# Patient Record
Sex: Female | Born: 1950 | Race: White | Hispanic: No | Marital: Single | Smoking: Former smoker
Health system: Southern US, Community
[De-identification: ages and names within clinical notes are randomized; demographics above are authoritative.]

## PROBLEM LIST (undated history)

## (undated) DIAGNOSIS — T7840XA Allergy, unspecified, initial encounter: Secondary | ICD-10-CM

## (undated) DIAGNOSIS — IMO0002 Reserved for concepts with insufficient information to code with codable children: Secondary | ICD-10-CM

## (undated) DIAGNOSIS — M1990 Unspecified osteoarthritis, unspecified site: Secondary | ICD-10-CM

## (undated) DIAGNOSIS — B019 Varicella without complication: Secondary | ICD-10-CM

## (undated) DIAGNOSIS — F32A Depression, unspecified: Secondary | ICD-10-CM

## (undated) DIAGNOSIS — E049 Nontoxic goiter, unspecified: Secondary | ICD-10-CM

## (undated) DIAGNOSIS — K5790 Diverticulosis of intestine, part unspecified, without perforation or abscess without bleeding: Secondary | ICD-10-CM

## (undated) DIAGNOSIS — Z87898 Personal history of other specified conditions: Secondary | ICD-10-CM

## (undated) DIAGNOSIS — R569 Unspecified convulsions: Secondary | ICD-10-CM

## (undated) DIAGNOSIS — K219 Gastro-esophageal reflux disease without esophagitis: Secondary | ICD-10-CM

## (undated) DIAGNOSIS — K635 Polyp of colon: Secondary | ICD-10-CM

## (undated) DIAGNOSIS — Z5189 Encounter for other specified aftercare: Secondary | ICD-10-CM

## (undated) DIAGNOSIS — F419 Anxiety disorder, unspecified: Secondary | ICD-10-CM

## (undated) DIAGNOSIS — H269 Unspecified cataract: Secondary | ICD-10-CM

## (undated) DIAGNOSIS — K5792 Diverticulitis of intestine, part unspecified, without perforation or abscess without bleeding: Secondary | ICD-10-CM

## (undated) HISTORY — DX: Personal history of other specified conditions: Z87.898

## (undated) HISTORY — DX: Reserved for concepts with insufficient information to code with codable children: IMO0002

## (undated) HISTORY — DX: Polyp of colon: K63.5

## (undated) HISTORY — DX: Gastro-esophageal reflux disease without esophagitis: K21.9

## (undated) HISTORY — DX: Allergy, unspecified, initial encounter: T78.40XA

## (undated) HISTORY — DX: Nontoxic goiter, unspecified: E04.9

## (undated) HISTORY — DX: Anxiety disorder, unspecified: F41.9

## (undated) HISTORY — DX: Depression, unspecified: F32.A

## (undated) HISTORY — DX: Unspecified cataract: H26.9

## (undated) HISTORY — PX: BREAST EXCISIONAL BIOPSY: SUR124

## (undated) HISTORY — DX: Diverticulitis of intestine, part unspecified, without perforation or abscess without bleeding: K57.92

## (undated) HISTORY — PX: MENISECTOMY: SHX5181

## (undated) HISTORY — DX: Varicella without complication: B01.9

## (undated) HISTORY — PX: LOBECTOMY FOR SEIZURE FOCUS: SHX333

## (undated) HISTORY — PX: TRIGGER FINGER RELEASE: SHX641

## (undated) HISTORY — DX: Encounter for other specified aftercare: Z51.89

## (undated) HISTORY — PX: EYE SURGERY: SHX253

## (undated) HISTORY — PX: WISDOM TOOTH EXTRACTION: SHX21

## (undated) HISTORY — PX: COSMETIC SURGERY: SHX468

## (undated) HISTORY — PX: JOINT REPLACEMENT: SHX530

## (undated) HISTORY — PX: BRAIN SURGERY: SHX531

---
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
## 1996-10-08 HISTORY — PX: LOBECTOMY FOR SEIZURE FOCUS: SHX333

## 2007-10-09 HISTORY — PX: BREAST LUMPECTOMY: SHX2

## 2013-12-05 ENCOUNTER — Emergency Department (HOSPITAL_COMMUNITY)
Admission: EM | Admit: 2013-12-05 | Discharge: 2013-12-05 | Disposition: A | Discharge status: Home / Self Care | Payer: 59 | Source: Home / Self Care | Attending: Emergency Medicine | Admitting: Emergency Medicine

## 2013-12-05 ENCOUNTER — Encounter (HOSPITAL_COMMUNITY): Payer: Self-pay | Admitting: Emergency Medicine

## 2013-12-05 DIAGNOSIS — IMO0002 Reserved for concepts with insufficient information to code with codable children: Secondary | ICD-10-CM

## 2013-12-05 DIAGNOSIS — H04559 Acquired stenosis of unspecified nasolacrimal duct: Secondary | ICD-10-CM

## 2013-12-05 HISTORY — DX: Unspecified convulsions: R56.9

## 2013-12-05 MED ORDER — CEPHALEXIN 500 MG PO CAPS: 500.0000 mg | ORAL_CAPSULE | Freq: Three times a day (TID) | ORAL | Status: DC

## 2013-12-05 MED ORDER — POLYMYXIN B-TRIMETHOPRIM 10000-0.1 UNIT/ML-% OP SOLN: 1.0000 [drp] | OPHTHALMIC | Status: DC

## 2013-12-05 NOTE — ED Provider Notes (Signed)
CSN: [id number]     Arrival date & time 12/05/13  1623 History   First MD Initiated Contact with Patient 12/05/13 1706     Chief Complaint  Patient presents with  . Eye Problem   (Consider location/radiation/quality/duration/timing/severity/associated sxs/prior Treatment) HPI 64 year old woman here with watery right eye.  She reports that 1 week ago, her hairdresser splashed some water on her face and some got in the right eye.  The next day, she noticed increased tears on the right side and this has persisted.  Also with some some right sided nasal congestion.  She denies any pain, but states the eye is irritated although she states she has been rubbing at it quite a bit.  She has tried lubricant eye drops and anti-histamine eye drops.  The anti-histamine drops seem to help some, but as soon she stops using them, the tearing is back.  Denies any changes in her vision.    Past Medical History  Diagnosis Date  . Seizures     resolved   Past Surgical History  Procedure Laterality Date  . Lobectomy for seizure focus  1998  . Adenoidectomy  1963  . Tonsillectomy  1963  . Splenectomy  1961  . Breast lumpectomy Left 2009    benign  . Wisdom tooth extraction     Family History  Problem Relation Age of Onset  . Pneumonia Mother   . Dementia Father    History  Substance Use Topics  . Smoking status: Former Smoker    Quit date: 10/08/2002  . Smokeless tobacco: Not on file  . Alcohol Use: Not on file   OB History   Grav Para Term Preterm Abortions TAB SAB Ect Mult Living                 Review of Systems  HENT: Positive for congestion (right sided).   Eyes: Positive for discharge (clear tears on right) and redness (right). Negative for photophobia, pain, itching and visual disturbance.    Allergies  Scopolamine and Penicillins  Home Medications   Current Outpatient Rx  Name  Route  Sig  Dispense  Refill  . calcium carbonate (OS-CAL) 600 MG TABS tablet   Oral   Take 600  mg by mouth daily with breakfast.         . Cholecalciferol (VITAMIN D3) 3000 UNITS TABS   Oral   Take by mouth.         . Flaxseed, Linseed, (GNP FLAX SEED OIL) 1000 MG CAPS   Oral   Take by mouth.         . levETIRAcetam (KEPPRA) 500 MG tablet   Oral   Take 500 mg by mouth 2 (two) times daily.         . Melatonin 3 MG TABS   Oral   Take by mouth.         . Multiple Vitamins-Minerals (MULTIVITAMIN WITH MINERALS) tablet   Oral   Take 1 tablet by mouth daily.         . Omega-3 Fatty Acids (FISH OIL) 1000 MG CAPS   Oral   Take by mouth.         . cephALEXin (KEFLEX) 500 MG capsule   Oral   Take 1 capsule (500 mg total) by mouth 3 (three) times daily.   30 capsule   0   . trimethoprim-polymyxin b (POLYTRIM) ophthalmic solution   Right Eye   Place 1 drop into the right eye  every 4 (four) hours.   10 mL   0    BP 129/84  Pulse 87  Temp(Src) 98.4 F (36.9 C) (Oral)  Resp 17  SpO2 97% Physical Exam  Constitutional: She is oriented to person, place, and time. She appears well-developed and well-nourished. No distress.  HENT:  Head: Normocephalic and atraumatic.  Eyes: EOM and lids are normal. Pupils are equal, round, and reactive to light. Lids are everted and swept, no foreign bodies found. Right eye exhibits no discharge. No foreign body present in the right eye. Left eye exhibits no discharge. Right conjunctiva is injected. Right conjunctiva has no hemorrhage. Left conjunctiva is not injected. Left conjunctiva has no hemorrhage.  Fluoroscein exam negative for corneal abrasion or foreign body. Right tear duct appears mildly swollen compared to the left.  Neck: Normal range of motion.  Cardiovascular: Normal rate.   Pulmonary/Chest: Effort normal.  Neurological: She is alert and oriented to person, place, and time.  Skin: Skin is warm and dry. No rash noted. She is not diaphoretic.  Psychiatric: Judgment and thought content normal.    ED Course   Procedures (including critical care time) Labs Review Labs Reviewed - No data to display Imaging Review No results found.   MDM   1. Blocked tear duct    No corneal abrasion or foreign body found.  Discussed warm compresses and massage with patient. Prescriptions for polytrim eye drops and keflex given to prevent infection. If not better in 1 week, instructed to call Dr. Talbert Forest (on call ophtho) for an appointment.    Melony Overly, MD 12/05/13 [phone]

## 2013-12-05 NOTE — Discharge Instructions (Signed)
I think your tear duct is blocked. We did an exam looking for a scratch on your eye and did not see anything.  Please use the antibiotic eye drops for the next 10 days. Take the oral antibiotic as well.   This to prevent the duct from getting infected.  Apply warm compresses 3 times a day. Massage the inner corner of the eye to try and help open up the duct.  If it is not better in the next week, please call the eye doctor for an appointment.

## 2013-12-05 NOTE — ED Notes (Signed)
Hairdresser splashed water in her R eye on Fri.  It started tearing that night and won't stop.  Her  R nostril gets congested and a decongestant helps it.

## 2013-12-05 NOTE — ED Provider Notes (Signed)
Medical screening examination/treatment/procedure(s) were performed by a resident physician and as supervising physician I was immediately available for consultation/collaboration.  Philipp Deputy, M.D.  Harden Mo, MD 12/05/13 2014

## 2015-03-19 ENCOUNTER — Emergency Department (HOSPITAL_COMMUNITY)
Admission: EM | Admit: 2015-03-19 | Discharge: 2015-03-19 | Disposition: A | Discharge status: Home / Self Care | Payer: 59 | Source: Home / Self Care | Attending: Family Medicine | Admitting: Family Medicine

## 2015-03-19 ENCOUNTER — Encounter (HOSPITAL_COMMUNITY): Payer: Self-pay | Admitting: Emergency Medicine

## 2015-03-19 DIAGNOSIS — R1032 Left lower quadrant pain: Secondary | ICD-10-CM | POA: Diagnosis not present

## 2015-03-19 DIAGNOSIS — K5730 Diverticulosis of large intestine without perforation or abscess without bleeding: Secondary | ICD-10-CM

## 2015-03-19 LAB — POCT URINALYSIS DIP (DEVICE)
BILIRUBIN URINE: NEGATIVE
Glucose, UA: NEGATIVE mg/dL
Ketones, ur: NEGATIVE mg/dL
LEUKOCYTES UA: NEGATIVE
Nitrite: NEGATIVE
Protein, ur: NEGATIVE mg/dL
Specific Gravity, Urine: 1.02 (ref 1.005–1.030)
Urobilinogen, UA: 0.2 mg/dL (ref 0.0–1.0)
pH: 8.5 — ABNORMAL HIGH (ref 5.0–8.0)

## 2015-03-19 MED ORDER — METRONIDAZOLE 500 MG PO TABS: 500.0000 mg | ORAL_TABLET | Freq: Three times a day (TID) | ORAL | Status: DC

## 2015-03-19 MED ORDER — SULFAMETHOXAZOLE-TRIMETHOPRIM 800-160 MG PO TABS: 2.0000 | ORAL_TABLET | Freq: Two times a day (BID) | ORAL | Status: DC

## 2015-03-19 NOTE — ED Provider Notes (Signed)
Desiree Snow is a 64 y.o. female who presents to Urgent Care today for abdominal cramping and bloating. Symptoms present for about 2 weeks. Patient developed a fever today of 100.46F at home. Symptoms started after she ate some peanuts. She notes mild fatigue. She has a diagnosis of diverticulosis but has never had diverticulitis. Abdominal pain is moderate in the left quadrant. 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   Past Medical History  Diagnosis Date  . Seizures     resolved   Past Surgical History  Procedure Laterality Date  . Adenoidectomy  1963  . Tonsillectomy  1963  . Splenectomy  1961  . Breast lumpectomy Left 2009    benign  . Wisdom tooth extraction    . Lobectomy for seizure focus     History  Substance Use Topics  . Smoking status: Former Smoker    Quit date: 10/08/2002  . Smokeless tobacco: Not on file  . Alcohol Use: Not on file   ROS as above Medications: No current facility-administered medications for this encounter.   Current Outpatient Prescriptions  Medication Sig Dispense Refill  . calcium carbonate (OS-CAL) 600 MG TABS tablet Take 600 mg by mouth daily with breakfast.    . cephALEXin (KEFLEX) 500 MG capsule Take 1 capsule (500 mg total) by mouth 3 (three) times daily. 30 capsule 0  . Cholecalciferol (VITAMIN D3) 3000 UNITS TABS Take by mouth.    . Flaxseed, Linseed, (GNP FLAX SEED OIL) 1000 MG CAPS Take by mouth.    . levETIRAcetam (KEPPRA) 500 MG tablet Take 500 mg by mouth 2 (two) times daily.    . Melatonin 3 MG TABS Take by mouth.    . metroNIDAZOLE (FLAGYL) 500 MG tablet Take 1 tablet (500 mg total) by mouth 3 (three) times daily. 21 tablet 0  . Multiple Vitamins-Minerals (MULTIVITAMIN WITH MINERALS) tablet Take 1 tablet by mouth daily.    . Omega-3 Fatty Acids (FISH OIL) 1000 MG CAPS Take by mouth.    . sulfamethoxazole-trimethoprim (BACTRIM DS,SEPTRA DS) 800-160 MG per tablet Take 2 tablets by mouth  2 (two) times daily. 28 tablet 0  . trimethoprim-polymyxin b (POLYTRIM) ophthalmic solution Place 1 drop into the right eye every 4 (four) hours. 10 mL 0   Allergies  Allergen Reactions  . Quinolones   . Scopolamine Hives     on her fingers no itchng  . Penicillins Rash     Exam:  BP 120/70 mmHg  Pulse 94  Temp(Src) 99.5 F (37.5 C) (Oral)  Resp 16  SpO2 99% Gen: Well NAD HEENT: EOMI,  MMM Lungs: Normal work of breathing. CTABL Heart: RRR no MRG Abd: NABS, Soft. Nondistended, mildly tender to palpation left abdomen. No rebound or guarding. Exts: Brisk capillary refill, warm and well perfused.   Results for orders placed or performed during the hospital encounter of 03/19/15 (from the past 24 hour(s))  POCT urinalysis dip (device)     Status: Abnormal   Collection Time: 03/19/15  3:24 PM  Result Value Ref Range   Glucose, UA NEGATIVE NEGATIVE mg/dL   Bilirubin Urine NEGATIVE NEGATIVE   Ketones, ur NEGATIVE NEGATIVE mg/dL   Specific Gravity, Urine 1.020 1.005 - 1.030   Hgb urine dipstick TRACE (A) NEGATIVE   pH 8.5 (H) 5.0 - 8.0   Protein, ur NEGATIVE NEGATIVE mg/dL   Urobilinogen, UA 0.2 0.0 - 1.0 mg/dL   Nitrite NEGATIVE NEGATIVE   Leukocytes, UA NEGATIVE NEGATIVE   No  results found.  Assessment and Plan: 64 y.o. female with possibly diverticulitis. Treat with Bactrim and Flagyl empirically. Follow-up with PCP.  Discussed warning signs or symptoms. Please see discharge instructions. Patient expresses understanding.     Gregor Hams, MD 03/19/15 [phone]

## 2015-03-19 NOTE — Discharge Instructions (Signed)
Thank you for coming in today.  Eat a clear diet for a while If your belly pain worsens, or you have high fever, bad vomiting, blood in your stool or black tarry stool go to the Emergency Room.   Diverticulitis Diverticulitis is inflammation or infection of small pouches in your colon that form when you have a condition called diverticulosis. The pouches in your colon are called diverticula. Your colon, or large intestine, is where water is absorbed and stool is formed. Complications of diverticulitis can include:  Bleeding.  Severe infection.  Severe pain.  Perforation of your colon.  Obstruction of your colon. CAUSES  Diverticulitis is caused by bacteria. Diverticulitis happens when stool becomes trapped in diverticula. This allows bacteria to grow in the diverticula, which can lead to inflammation and infection. RISK FACTORS People with diverticulosis are at risk for diverticulitis. Eating a diet that does not include enough fiber from fruits and vegetables may make diverticulitis more likely to develop. SYMPTOMS  Symptoms of diverticulitis may include:  Abdominal pain and tenderness. The pain is normally located on the left side of the abdomen, but may occur in other areas.  Fever and chills.  Bloating.  Cramping.  Nausea.  Vomiting.  Constipation.  Diarrhea.  Blood in your stool. DIAGNOSIS  Your health care provider will ask you about your medical history and do a physical exam. You may need to have tests done because many medical conditions can cause the same symptoms as diverticulitis. Tests may include:  Blood tests.  Urine tests.  Imaging tests of the abdomen, including X-rays and CT scans. When your condition is under control, your health care provider may recommend that you have a colonoscopy. A colonoscopy can show how severe your diverticula are and whether something else is causing your symptoms. TREATMENT  Most cases of diverticulitis are mild and  can be treated at home. Treatment may include:  Taking over-the-counter pain medicines.  Following a clear liquid diet.  Taking antibiotic medicines by mouth for 7-10 days. More severe cases may be treated at a hospital. Treatment may include:  Not eating or drinking.  Taking prescription pain medicine.  Receiving antibiotic medicines through an IV tube.  Receiving fluids and nutrition through an IV tube.  Surgery. HOME CARE INSTRUCTIONS   Follow your health care provider's instructions carefully.  Follow a full liquid diet or other diet as directed by your health care provider. After your symptoms improve, your health care provider may tell you to change your diet. He or she may recommend you eat a high-fiber diet. Fruits and vegetables are good sources of fiber. Fiber makes it easier to pass stool.  Take fiber supplements or probiotics as directed by your health care provider.  Only take medicines as directed by your health care provider.  Keep all your follow-up appointments. SEEK MEDICAL CARE IF:   Your pain does not improve.  You have a hard time eating food.  Your bowel movements do not return to normal. SEEK IMMEDIATE MEDICAL CARE IF:   Your pain becomes worse.  Your symptoms do not get better.  Your symptoms suddenly get worse.  You have a fever.  You have repeated vomiting.  You have bloody or black, tarry stools. MAKE SURE YOU:   Understand these instructions.  Will watch your condition.  Will get help right away if you are not doing well or get worse. Document Released: 07/04/2005 Document Revised: 09/29/2013 Document Reviewed: 08/19/2013 [facility] Patient Information 2015 [place], [place]. This information  is not intended to replace advice given to you by your health care provider. Make sure you discuss any questions you have with your health care provider.

## 2015-03-19 NOTE — ED Notes (Signed)
C/o lower cramping abd pain Did have diarrhea once pepto bismol was used as tx Did have fever Stated 2009 was dx with diverculitis

## 2015-03-20 LAB — URINE CULTURE: Colony Count: 30000

## 2015-09-26 ENCOUNTER — Other Ambulatory Visit: Payer: Self-pay

## 2015-09-26 DIAGNOSIS — Z1231 Encounter for screening mammogram for malignant neoplasm of breast: Secondary | ICD-10-CM

## 2015-10-19 ENCOUNTER — Ambulatory Visit
Admission: RE | Admit: 2015-10-19 | Discharge: 2015-10-19 | Disposition: A | Discharge status: Home / Self Care | Payer: PPO | Source: Ambulatory Visit

## 2015-10-19 DIAGNOSIS — Z1231 Encounter for screening mammogram for malignant neoplasm of breast: Secondary | ICD-10-CM | POA: Diagnosis not present

## 2015-11-24 ENCOUNTER — Other Ambulatory Visit (HOSPITAL_COMMUNITY)
Admission: RE | Admit: 2015-11-24 | Discharge: 2015-11-24 | Disposition: A | Discharge status: Home / Self Care | Payer: PPO | Source: Ambulatory Visit | Attending: Family Medicine | Admitting: Family Medicine

## 2015-11-24 ENCOUNTER — Other Ambulatory Visit: Payer: Self-pay | Admitting: Family Medicine

## 2015-11-24 DIAGNOSIS — M25561 Pain in right knee: Secondary | ICD-10-CM | POA: Diagnosis not present

## 2015-11-24 DIAGNOSIS — Z0000 Encounter for general adult medical examination without abnormal findings: Secondary | ICD-10-CM | POA: Diagnosis not present

## 2015-11-24 DIAGNOSIS — Z86010 Personal history of colonic polyps: Secondary | ICD-10-CM | POA: Diagnosis not present

## 2015-11-24 DIAGNOSIS — G40909 Epilepsy, unspecified, not intractable, without status epilepticus: Secondary | ICD-10-CM | POA: Diagnosis not present

## 2015-11-24 DIAGNOSIS — R5383 Other fatigue: Secondary | ICD-10-CM | POA: Diagnosis not present

## 2015-11-24 DIAGNOSIS — R7301 Impaired fasting glucose: Secondary | ICD-10-CM | POA: Diagnosis not present

## 2015-11-24 DIAGNOSIS — Z1211 Encounter for screening for malignant neoplasm of colon: Secondary | ICD-10-CM | POA: Diagnosis not present

## 2015-11-24 DIAGNOSIS — Z124 Encounter for screening for malignant neoplasm of cervix: Secondary | ICD-10-CM | POA: Diagnosis not present

## 2015-11-24 DIAGNOSIS — Z23 Encounter for immunization: Secondary | ICD-10-CM | POA: Diagnosis not present

## 2015-11-24 DIAGNOSIS — Z1389 Encounter for screening for other disorder: Secondary | ICD-10-CM | POA: Diagnosis not present

## 2015-11-24 DIAGNOSIS — Z136 Encounter for screening for cardiovascular disorders: Secondary | ICD-10-CM | POA: Diagnosis not present

## 2015-11-24 DIAGNOSIS — F419 Anxiety disorder, unspecified: Secondary | ICD-10-CM | POA: Diagnosis not present

## 2015-11-24 LAB — LIPID PANEL
Cholesterol: 176 (ref 0–200)
HDL: 48 (ref 35–70)
LDL Cholesterol: 107
Triglycerides: 101 (ref 40–160)

## 2015-11-24 LAB — BASIC METABOLIC PANEL
BUN: 13 (ref 4–21)
CREATININE: 1 (ref 0.5–1.1)
GLUCOSE: 103
POTASSIUM: 5.1 (ref 3.4–5.3)
Sodium: 139 (ref 137–147)

## 2015-11-24 LAB — HEPATIC FUNCTION PANEL
ALT: 17 (ref 7–35)
AST: 15 (ref 13–35)

## 2015-11-24 LAB — TSH: TSH: 1.29 (ref 0.41–5.90)

## 2015-11-24 LAB — HEMOGLOBIN A1C: HEMOGLOBIN A1C: 6.2

## 2015-11-25 LAB — CYTOLOGY - PAP

## 2015-11-28 DIAGNOSIS — Z1211 Encounter for screening for malignant neoplasm of colon: Secondary | ICD-10-CM | POA: Diagnosis not present

## 2015-11-28 DIAGNOSIS — E878 Other disorders of electrolyte and fluid balance, not elsewhere classified: Secondary | ICD-10-CM | POA: Diagnosis not present

## 2015-11-30 LAB — FECAL OCCULT BLOOD, GUAIAC: Fecal Occult Blood: NEGATIVE

## 2015-12-02 DIAGNOSIS — M1711 Unilateral primary osteoarthritis, right knee: Secondary | ICD-10-CM | POA: Diagnosis not present

## 2015-12-02 DIAGNOSIS — M25561 Pain in right knee: Secondary | ICD-10-CM | POA: Diagnosis not present

## 2015-12-02 DIAGNOSIS — M1712 Unilateral primary osteoarthritis, left knee: Secondary | ICD-10-CM | POA: Diagnosis not present

## 2015-12-02 DIAGNOSIS — M25562 Pain in left knee: Secondary | ICD-10-CM | POA: Diagnosis not present

## 2015-12-08 DIAGNOSIS — M25561 Pain in right knee: Secondary | ICD-10-CM | POA: Diagnosis not present

## 2015-12-08 DIAGNOSIS — M25562 Pain in left knee: Secondary | ICD-10-CM | POA: Diagnosis not present

## 2015-12-08 DIAGNOSIS — M170 Bilateral primary osteoarthritis of knee: Secondary | ICD-10-CM | POA: Diagnosis not present

## 2015-12-13 DIAGNOSIS — M25561 Pain in right knee: Secondary | ICD-10-CM | POA: Diagnosis not present

## 2015-12-13 DIAGNOSIS — M170 Bilateral primary osteoarthritis of knee: Secondary | ICD-10-CM | POA: Diagnosis not present

## 2015-12-13 DIAGNOSIS — M25562 Pain in left knee: Secondary | ICD-10-CM | POA: Diagnosis not present

## 2015-12-15 DIAGNOSIS — M170 Bilateral primary osteoarthritis of knee: Secondary | ICD-10-CM | POA: Diagnosis not present

## 2015-12-15 DIAGNOSIS — M25561 Pain in right knee: Secondary | ICD-10-CM | POA: Diagnosis not present

## 2015-12-15 DIAGNOSIS — M25562 Pain in left knee: Secondary | ICD-10-CM | POA: Diagnosis not present

## 2015-12-20 DIAGNOSIS — M25562 Pain in left knee: Secondary | ICD-10-CM | POA: Diagnosis not present

## 2015-12-20 DIAGNOSIS — M170 Bilateral primary osteoarthritis of knee: Secondary | ICD-10-CM | POA: Diagnosis not present

## 2015-12-20 DIAGNOSIS — M25561 Pain in right knee: Secondary | ICD-10-CM | POA: Diagnosis not present

## 2015-12-23 DIAGNOSIS — H04123 Dry eye syndrome of bilateral lacrimal glands: Secondary | ICD-10-CM | POA: Diagnosis not present

## 2015-12-27 DIAGNOSIS — H52223 Regular astigmatism, bilateral: Secondary | ICD-10-CM | POA: Diagnosis not present

## 2015-12-27 DIAGNOSIS — M25562 Pain in left knee: Secondary | ICD-10-CM | POA: Diagnosis not present

## 2015-12-27 DIAGNOSIS — H5213 Myopia, bilateral: Secondary | ICD-10-CM | POA: Diagnosis not present

## 2015-12-27 DIAGNOSIS — H524 Presbyopia: Secondary | ICD-10-CM | POA: Diagnosis not present

## 2015-12-27 DIAGNOSIS — H2513 Age-related nuclear cataract, bilateral: Secondary | ICD-10-CM | POA: Diagnosis not present

## 2015-12-27 DIAGNOSIS — M25561 Pain in right knee: Secondary | ICD-10-CM | POA: Diagnosis not present

## 2015-12-27 DIAGNOSIS — M170 Bilateral primary osteoarthritis of knee: Secondary | ICD-10-CM | POA: Diagnosis not present

## 2015-12-29 DIAGNOSIS — M25562 Pain in left knee: Secondary | ICD-10-CM | POA: Diagnosis not present

## 2015-12-29 DIAGNOSIS — M25561 Pain in right knee: Secondary | ICD-10-CM | POA: Diagnosis not present

## 2015-12-29 DIAGNOSIS — M170 Bilateral primary osteoarthritis of knee: Secondary | ICD-10-CM | POA: Diagnosis not present

## 2016-01-02 DIAGNOSIS — E2839 Other primary ovarian failure: Secondary | ICD-10-CM | POA: Diagnosis not present

## 2016-01-02 DIAGNOSIS — M8589 Other specified disorders of bone density and structure, multiple sites: Secondary | ICD-10-CM | POA: Diagnosis not present

## 2016-01-04 DIAGNOSIS — M25562 Pain in left knee: Secondary | ICD-10-CM | POA: Diagnosis not present

## 2016-01-04 DIAGNOSIS — M25561 Pain in right knee: Secondary | ICD-10-CM | POA: Diagnosis not present

## 2016-03-09 DIAGNOSIS — Z1211 Encounter for screening for malignant neoplasm of colon: Secondary | ICD-10-CM | POA: Diagnosis not present

## 2016-03-09 DIAGNOSIS — R319 Hematuria, unspecified: Secondary | ICD-10-CM | POA: Diagnosis not present

## 2016-03-09 DIAGNOSIS — R8290 Unspecified abnormal findings in urine: Secondary | ICD-10-CM | POA: Diagnosis not present

## 2016-03-09 DIAGNOSIS — Z87448 Personal history of other diseases of urinary system: Secondary | ICD-10-CM | POA: Diagnosis not present

## 2016-03-09 DIAGNOSIS — E878 Other disorders of electrolyte and fluid balance, not elsewhere classified: Secondary | ICD-10-CM | POA: Diagnosis not present

## 2016-03-09 LAB — BASIC METABOLIC PANEL
BUN: 15 (ref 4–21)
Creatinine: 1 (ref 0.5–1.1)
Glucose: 120
Potassium: 4.4 (ref 3.4–5.3)
SODIUM: 139 (ref 137–147)

## 2016-05-02 DIAGNOSIS — R9341 Abnormal radiologic findings on diagnostic imaging of renal pelvis, ureter, or bladder: Secondary | ICD-10-CM | POA: Diagnosis not present

## 2016-05-02 DIAGNOSIS — R3129 Other microscopic hematuria: Secondary | ICD-10-CM | POA: Diagnosis not present

## 2016-05-11 DIAGNOSIS — S8981XD Other specified injuries of right lower leg, subsequent encounter: Secondary | ICD-10-CM | POA: Diagnosis not present

## 2016-05-15 DIAGNOSIS — M25561 Pain in right knee: Secondary | ICD-10-CM | POA: Diagnosis not present

## 2016-05-18 DIAGNOSIS — M25561 Pain in right knee: Secondary | ICD-10-CM | POA: Diagnosis not present

## 2016-05-22 DIAGNOSIS — M25561 Pain in right knee: Secondary | ICD-10-CM | POA: Diagnosis not present

## 2016-05-25 DIAGNOSIS — M25561 Pain in right knee: Secondary | ICD-10-CM | POA: Diagnosis not present

## 2016-06-19 DIAGNOSIS — M25561 Pain in right knee: Secondary | ICD-10-CM | POA: Diagnosis not present

## 2016-06-28 DIAGNOSIS — G40909 Epilepsy, unspecified, not intractable, without status epilepticus: Secondary | ICD-10-CM | POA: Diagnosis not present

## 2016-06-28 DIAGNOSIS — E669 Obesity, unspecified: Secondary | ICD-10-CM | POA: Diagnosis not present

## 2016-07-02 ENCOUNTER — Ambulatory Visit: Payer: PPO

## 2016-08-07 DIAGNOSIS — M25561 Pain in right knee: Secondary | ICD-10-CM | POA: Diagnosis not present

## 2016-10-19 ENCOUNTER — Other Ambulatory Visit: Payer: Self-pay | Admitting: Family Medicine

## 2016-10-19 DIAGNOSIS — Z1231 Encounter for screening mammogram for malignant neoplasm of breast: Secondary | ICD-10-CM

## 2016-11-02 ENCOUNTER — Ambulatory Visit
Admission: RE | Admit: 2016-11-02 | Discharge: 2016-11-02 | Disposition: A | Discharge status: Home / Self Care | Payer: PPO | Source: Ambulatory Visit | Attending: Family Medicine | Admitting: Family Medicine

## 2016-11-02 DIAGNOSIS — Z1231 Encounter for screening mammogram for malignant neoplasm of breast: Secondary | ICD-10-CM

## 2016-11-03 DIAGNOSIS — M79601 Pain in right arm: Secondary | ICD-10-CM | POA: Diagnosis not present

## 2016-11-03 DIAGNOSIS — M79631 Pain in right forearm: Secondary | ICD-10-CM | POA: Diagnosis not present

## 2016-11-22 DIAGNOSIS — Z0000 Encounter for general adult medical examination without abnormal findings: Secondary | ICD-10-CM | POA: Diagnosis not present

## 2016-11-22 DIAGNOSIS — Z1159 Encounter for screening for other viral diseases: Secondary | ICD-10-CM | POA: Diagnosis not present

## 2016-11-22 DIAGNOSIS — R7301 Impaired fasting glucose: Secondary | ICD-10-CM | POA: Diagnosis not present

## 2016-11-22 DIAGNOSIS — Z136 Encounter for screening for cardiovascular disorders: Secondary | ICD-10-CM | POA: Diagnosis not present

## 2016-11-22 LAB — HEPATIC FUNCTION PANEL
ALT: 16 (ref 7–35)
AST: 15 (ref 13–35)
BILIRUBIN, TOTAL: 0.4

## 2016-11-22 LAB — BASIC METABOLIC PANEL
BUN: 16 (ref 4–21)
CREATININE: 1 (ref 0.5–1.1)
Glucose: 109
Potassium: 4.4 (ref 3.4–5.3)
Sodium: 140 (ref 137–147)

## 2016-11-22 LAB — TSH: TSH: 1.22 (ref 0.41–5.90)

## 2016-11-22 LAB — LIPID PANEL
CHOLESTEROL: 174 (ref 0–200)
HDL: 56 (ref 35–70)
LDL Cholesterol: 102
Triglycerides: 78 (ref 40–160)

## 2016-11-22 LAB — CBC AND DIFFERENTIAL
HCT: 42 (ref 36–46)
HEMOGLOBIN: 14.2 (ref 12.0–16.0)
Platelets: 319 (ref 150–399)
WBC: 10

## 2016-11-22 LAB — HEMOGLOBIN A1C: HEMOGLOBIN A1C: 6.5

## 2016-11-27 DIAGNOSIS — M8588 Other specified disorders of bone density and structure, other site: Secondary | ICD-10-CM | POA: Diagnosis not present

## 2016-11-27 DIAGNOSIS — Z1211 Encounter for screening for malignant neoplasm of colon: Secondary | ICD-10-CM | POA: Diagnosis not present

## 2016-11-27 DIAGNOSIS — Z0000 Encounter for general adult medical examination without abnormal findings: Secondary | ICD-10-CM | POA: Diagnosis not present

## 2016-11-27 DIAGNOSIS — R3129 Other microscopic hematuria: Secondary | ICD-10-CM | POA: Diagnosis not present

## 2016-11-27 DIAGNOSIS — Z1389 Encounter for screening for other disorder: Secondary | ICD-10-CM | POA: Diagnosis not present

## 2016-11-27 DIAGNOSIS — E119 Type 2 diabetes mellitus without complications: Secondary | ICD-10-CM | POA: Diagnosis not present

## 2016-11-27 DIAGNOSIS — G40909 Epilepsy, unspecified, not intractable, without status epilepticus: Secondary | ICD-10-CM | POA: Diagnosis not present

## 2016-11-27 DIAGNOSIS — F411 Generalized anxiety disorder: Secondary | ICD-10-CM | POA: Diagnosis not present

## 2016-11-30 ENCOUNTER — Other Ambulatory Visit: Payer: Self-pay | Admitting: Acute Care

## 2016-11-30 DIAGNOSIS — Z87891 Personal history of nicotine dependence: Secondary | ICD-10-CM

## 2016-12-21 ENCOUNTER — Inpatient Hospital Stay: Admission: RE | Admit: 2016-12-21 | Payer: PPO | Source: Ambulatory Visit

## 2016-12-21 ENCOUNTER — Encounter: Payer: PPO | Admitting: Acute Care

## 2017-01-02 DIAGNOSIS — E119 Type 2 diabetes mellitus without complications: Secondary | ICD-10-CM | POA: Diagnosis not present

## 2017-01-11 DIAGNOSIS — K219 Gastro-esophageal reflux disease without esophagitis: Secondary | ICD-10-CM | POA: Diagnosis not present

## 2017-01-11 DIAGNOSIS — J3489 Other specified disorders of nose and nasal sinuses: Secondary | ICD-10-CM | POA: Diagnosis not present

## 2017-01-11 DIAGNOSIS — F419 Anxiety disorder, unspecified: Secondary | ICD-10-CM | POA: Diagnosis not present

## 2017-01-11 DIAGNOSIS — Z87891 Personal history of nicotine dependence: Secondary | ICD-10-CM | POA: Diagnosis not present

## 2017-01-11 DIAGNOSIS — Z86010 Personal history of colonic polyps: Secondary | ICD-10-CM | POA: Diagnosis not present

## 2017-01-11 DIAGNOSIS — N183 Chronic kidney disease, stage 3 (moderate): Secondary | ICD-10-CM | POA: Diagnosis not present

## 2017-01-11 DIAGNOSIS — D179 Benign lipomatous neoplasm, unspecified: Secondary | ICD-10-CM | POA: Diagnosis not present

## 2017-01-28 DIAGNOSIS — K219 Gastro-esophageal reflux disease without esophagitis: Secondary | ICD-10-CM | POA: Diagnosis not present

## 2017-01-28 DIAGNOSIS — Z1211 Encounter for screening for malignant neoplasm of colon: Secondary | ICD-10-CM | POA: Diagnosis not present

## 2017-02-08 ENCOUNTER — Ambulatory Visit: Payer: PPO

## 2017-02-08 ENCOUNTER — Encounter: Payer: PPO | Admitting: Acute Care

## 2017-02-12 DIAGNOSIS — Z1211 Encounter for screening for malignant neoplasm of colon: Secondary | ICD-10-CM | POA: Diagnosis not present

## 2017-02-15 ENCOUNTER — Ambulatory Visit (INDEPENDENT_AMBULATORY_CARE_PROVIDER_SITE_OTHER)
Admission: RE | Admit: 2017-02-15 | Discharge: 2017-02-15 | Disposition: A | Discharge status: Home / Self Care | Payer: PPO | Source: Ambulatory Visit | Attending: Acute Care | Admitting: Acute Care

## 2017-02-15 ENCOUNTER — Encounter: Payer: Self-pay | Admitting: Acute Care

## 2017-02-15 ENCOUNTER — Ambulatory Visit (INDEPENDENT_AMBULATORY_CARE_PROVIDER_SITE_OTHER): Payer: PPO | Admitting: Acute Care

## 2017-02-15 DIAGNOSIS — Z87891 Personal history of nicotine dependence: Secondary | ICD-10-CM | POA: Diagnosis not present

## 2017-02-15 DIAGNOSIS — F17210 Nicotine dependence, cigarettes, uncomplicated: Secondary | ICD-10-CM | POA: Diagnosis not present

## 2017-02-15 LAB — FECAL OCCULT BLOOD, GUAIAC: FECAL OCCULT BLD: NEGATIVE

## 2017-02-15 NOTE — Progress Notes (Signed)
Shared Decision Making Visit Lung Cancer Screening Program [phone])   Eligibility:  Age 66 y.o.  Pack Years Smoking History Calculation 58-pack-year smoking history (# packs/per year x # years smoked)  Recent History of coughing up blood  no  Unexplained weight loss? no ( >Than 15 pounds within the last 6 months )  Prior History Lung / other cancer no (Diagnosis within the last 5 years already requiring surveillance chest CT Scans).  Smoking Status: Former smoker  Former Smokers: Years since quit: 14 years  Quit Date: 2004  Visit Components:  Discussion included one or more decision making aids. yes  Discussion included risk/benefits of screening. yes  Discussion included potential follow up diagnostic testing for abnormal scans. yes  Discussion included meaning and risk of over diagnosis. yes  Discussion included meaning and risk of False Positives. yes  Discussion included meaning of total radiation exposure. yes  Counseling Included:  Importance of adherence to annual lung cancer LDCT screening. yes  Impact of comorbidities on ability to participate in the program. yes  Ability and willingness to under diagnostic treatment. yes  Smoking Cessation Counseling:  Current Smokers:   Discussed importance of smoking cessation. yes  Information about tobacco cessation classes and interventions provided to patient. no  Patient provided with "ticket" for LDCT Scan. yes  Symptomatic Patient. no  Counseling  Diagnosis Code: Tobacco Use Z72.0  Asymptomatic Patient yes  Counseling (Intermediate counseling: > three minutes counseling) V4259  Former Smokers:   Discussed the importance of maintaining cigarette abstinence. yes  Diagnosis Code: Personal History of Nicotine Dependence. D63.875  Information about tobacco cessation classes and interventions provided to patient. Yes  Patient provided with "ticket" for LDCT Scan. yes  Written Order for Lung Cancer  Screening with LDCT placed in Epic. Yes (CT Chest Lung Cancer Screening Low Dose W/O CM) [id number] Z12.2-Screening of respiratory organs Z87.891-Personal history of nicotine dependence  I spent 25 minutes of face to face time with Mrs. Killgore discussing the risks and benefits of lung cancer screening. We viewed a power point together that explained in detail the above noted topics. We took the time to pause the power point at intervals to allow for questions to be asked and answered to ensure understanding. We discussed that she had taken the single most powerful action possible to decrease her risk of developing lung cancer when she quit smoking. I counseled her to remain smoke free, and to contact me if she ever had the desire to smoke again so that I can provide resources and tools to help support the effort to remain smoke free. We discussed the time and location of the scan, and that either  Doroteo Glassman RN or I will call with the results within  24-48 hours of receiving them. Mrs. Cronic has my card and contact information in the event she needs to speak with me, in addition to a copy of the power point we reviewed as a resource. The patient verbalized understanding of all of the above and had no further questions upon leaving the office.     I explained to the patient that there has been a high incidence of coronary artery disease noted on these exams. I explained that this is a non-gated exam therefore degree or severity cannot be determined. This patient is not currently on statin therapy. I have asked the patient to follow-up with their PCP regarding any incidental finding of coronary artery disease and management with diet or medication as they feel is  clinically indicated. The patient verbalized understanding of the above and had no further questions.     Magdalen Spatz, NP 02/15/2017

## 2017-02-19 ENCOUNTER — Telehealth: Payer: Self-pay | Admitting: Acute Care

## 2017-02-19 NOTE — Telephone Encounter (Signed)
I attempted to call the results of Mrs. Desiree Snow's  low dose screening CT to her today.There was no answer. I have left a message with contact information  requesting that she call the office for results.

## 2017-02-20 NOTE — Telephone Encounter (Signed)
These results of been called to the patient. I explained that her scan was read as a Lung RADS 1, negative study: no nodules or definitely benign nodules. Radiology recommendation is for a repeat LDCT in 12 months. I explained we will order and schedule her repeat scan for May 2019. I also informed her that we would send a copy of the results to her primary care provider. I did inform her that there was notation of some mild atherosclerotic calcifications. She is currently not on statin medication. I have asked her to follow-up with her primary care provider regarding risk factor evaluation, dietary modifications, or medical therapy as her primary care provider feels is clinically indicated. The patient stated that she will follow-up with her PCP regarding this incidental finding. We will fax a copy of these results to the PCP. The patient verbalized understanding of the above and had no further questions at completion of the phone call. She has her contact information in the event she has any questions in the future.

## 2017-02-21 ENCOUNTER — Other Ambulatory Visit: Payer: Self-pay | Admitting: Acute Care

## 2017-02-21 DIAGNOSIS — Z87891 Personal history of nicotine dependence: Secondary | ICD-10-CM

## 2017-03-01 DIAGNOSIS — E119 Type 2 diabetes mellitus without complications: Secondary | ICD-10-CM | POA: Diagnosis not present

## 2017-04-12 ENCOUNTER — Ambulatory Visit (INDEPENDENT_AMBULATORY_CARE_PROVIDER_SITE_OTHER): Payer: PPO | Admitting: Family Medicine

## 2017-04-12 ENCOUNTER — Encounter: Payer: Self-pay | Admitting: Family Medicine

## 2017-04-12 VITALS — BP 128/70 | HR 84 | Resp 12 | Ht 66.0 in | Wt 210.4 lb

## 2017-04-12 DIAGNOSIS — Z6833 Body mass index (BMI) 33.0-33.9, adult: Secondary | ICD-10-CM | POA: Diagnosis not present

## 2017-04-12 DIAGNOSIS — Z23 Encounter for immunization: Secondary | ICD-10-CM | POA: Diagnosis not present

## 2017-04-12 DIAGNOSIS — N289 Disorder of kidney and ureter, unspecified: Secondary | ICD-10-CM | POA: Diagnosis not present

## 2017-04-12 DIAGNOSIS — Z1211 Encounter for screening for malignant neoplasm of colon: Secondary | ICD-10-CM | POA: Diagnosis not present

## 2017-04-12 DIAGNOSIS — E669 Obesity, unspecified: Secondary | ICD-10-CM | POA: Diagnosis not present

## 2017-04-12 DIAGNOSIS — N888 Other specified noninflammatory disorders of cervix uteri: Secondary | ICD-10-CM | POA: Diagnosis not present

## 2017-04-12 DIAGNOSIS — K219 Gastro-esophageal reflux disease without esophagitis: Secondary | ICD-10-CM

## 2017-04-12 DIAGNOSIS — R7301 Impaired fasting glucose: Secondary | ICD-10-CM | POA: Insufficient documentation

## 2017-04-12 LAB — BASIC METABOLIC PANEL
BUN: 16 mg/dL (ref 6–23)
CALCIUM: 9.5 mg/dL (ref 8.4–10.5)
CHLORIDE: 102 meq/L (ref 96–112)
CO2: 28 meq/L (ref 19–32)
Creatinine, Ser: 0.98 mg/dL (ref 0.40–1.20)
GFR: 72.8 mL/min (ref >60.00)
Glucose, Bld: 99 mg/dL (ref 70–99)
Potassium: 4.2 mEq/L (ref 3.5–5.1)
Sodium: 138 mEq/L (ref 135–145)

## 2017-04-12 LAB — CBC WITH DIFFERENTIAL/PLATELET
BASOS ABS: 0.1 10*3/uL (ref 0.0–0.1)
Basophils Relative: 0.8 % (ref 0.0–3.0)
Eosinophils Absolute: 0.3 10*3/uL (ref 0.0–0.7)
Eosinophils Relative: 2.9 % (ref 0.0–5.0)
HEMATOCRIT: 42.6 % (ref 36.0–46.0)
Hemoglobin: 14.4 g/dL (ref 12.0–15.0)
LYMPHS PCT: 38.1 % (ref 12.0–46.0)
Lymphs Abs: 3.8 10*3/uL (ref 0.7–4.0)
MCHC: 33.7 g/dL (ref 30.0–36.0)
MCV: 92.7 fl (ref 78.0–100.0)
MONOS PCT: 8.6 % (ref 3.0–12.0)
Monocytes Absolute: 0.9 10*3/uL (ref 0.1–1.0)
Neutro Abs: 5 10*3/uL (ref 1.4–7.7)
Neutrophils Relative %: 49.6 % (ref 43.0–77.0)
Platelets: 346 10*3/uL (ref 150.0–400.0)
RBC: 4.6 Mil/uL (ref 3.87–5.11)
RDW: 12.8 % (ref 11.5–15.5)
WBC: 10 10*3/uL (ref 4.0–10.5)

## 2017-04-12 LAB — MICROALBUMIN / CREATININE URINE RATIO
CREATININE, U: 109.9 mg/dL
Microalb Creat Ratio: 0.6 mg/g (ref 0.0–30.0)
Microalb, Ur: <0.7 mg/dL (ref 0.0–1.9)

## 2017-04-12 NOTE — Progress Notes (Signed)
HPI:   Desiree Snow is a 66 y.o. female, who is here today to establish care.  Former PCP: Dr Drema Dallas at [facility], [place] Last preventive routine visit: 1-2 years ago.  Chronic medical problems: IFG, seasonal allergies, diverticulosis, seizure disorder, microscopic hematuria, traumatic splenectomy, and osteopenia among some.  HgA1C 6.5 about 6 months ago and in 02/2017 down to 6.2. She has changed her diet, eating healthier, has lost wt. She is also taking OTC Garcinia Cambogia supplement for wt loss. She does not exercise regularly.  She lives alone. She is a Designer, jewellery.  Concerns today:    Abnormal renal function: According to pt, she has had mildly low e GFR since 2013 in the upper 50's. She is concerned about this being related to [place]. She denies Hx of HTN or chronic use of NSAID's. Recently Dx with "borderline diabetes." She denies decreased urine output, gross hematuria, or foam in urine.  She has Hx of seizures, s/p anterior temporal lobectomy and amigdalo-hyppocampectomy in 1998. She is still taking Keppra 500 mg bid. States that she has tried to discontinued but if she decreases dose further she can feel symptoms that preceded seizure years ago. She follows with neurologists, Dr Manuella Ghazi.  Hx of microscopic hematuria, she followed with urologists until recently, instructed to follow as needed. According to pt, renal cysts were seen in prior renal imaging. She brought copy of recent abdominal CT report: Diffuse bladder wall thickening. No filling defects that indicate urothelial mass, nephrolithiasis, or hydroureteronephrosis. Cholelithiasis.    GERD: Occasional acid reflux Famotidine 20 mg bid helping. She has not been interested in PPI's, afraid of having side effects.  She has been symptomatic for years and former PCP was planning on EGD. She is also due for colonoscopy.  Denies abdominal pain, nausea, vomiting, changes in bowel habits,  blood in stool or melena.  She is also requesting vaccination update. She is due for Tdap and would like the new zoster vaccine.   Review of Systems  Constitutional: Negative for activity change, appetite change, fatigue, fever and unexpected weight change.  HENT: Negative for mouth sores, nosebleeds and trouble swallowing.   Eyes: Negative for redness and visual disturbance.  Respiratory: Negative for cough, shortness of breath and wheezing.   Cardiovascular: Negative for chest pain, palpitations and leg swelling.  Gastrointestinal: Negative for abdominal pain, blood in stool, nausea and vomiting.       Negative for changes in bowel habits.  Endocrine: Negative for polydipsia, polyphagia and polyuria.  Genitourinary: Negative for decreased urine volume, difficulty urinating, dysuria and hematuria.  Musculoskeletal: Negative for gait problem and myalgias.  Skin: Negative for rash.  Allergic/Immunologic: Positive for environmental allergies.  Neurological: Negative for syncope, weakness and headaches.  Hematological: Negative for adenopathy. Does not bruise/bleed easily.  Psychiatric/Behavioral: Negative for confusion. The patient is nervous/anxious.       Current Outpatient Prescriptions on File Prior to Visit  Medication Sig Dispense Refill  . levETIRAcetam (KEPPRA) 500 MG tablet Take 500 mg by mouth 2 (two) times daily.     No current facility-administered medications on file prior to visit.      Past Medical History:  Diagnosis Date  . Allergy   . Chicken pox   . Diverticulitis   . GERD (gastroesophageal reflux disease)   . Seizures ([place])    resolved   Allergies  Allergen Reactions  . Quinolones   . Scopolamine Hives     on her fingers no itchng  .  Penicillins Rash    Family History  Problem Relation Age of Onset  . Pneumonia Mother   . Dementia Father     Social History   Social History  . Marital status: Single    Spouse name: N/A  . Number of  children: N/A  . Years of education: N/A   Social History Main Topics  . Smoking status: Former Smoker    Packs/day: 2.00    Years: 29.00    Types: Cigarettes    Quit date: 10/08/2002  . Smokeless tobacco: Never Used  . Alcohol use None  . Drug use: No  . Sexual activity: Not Asked   Other Topics Concern  . None   Social History Narrative  . None    Vitals:   04/12/17 1354  BP: 128/70  Pulse: 84  Resp: 12  O2 sat at RA 96% Body mass index is 33.96 kg/m.   Physical Exam  Nursing note and vitals reviewed. Constitutional: She is oriented to person, place, and time. She appears well-developed. No distress.  HENT:  Head: Normocephalic and atraumatic.  Mouth/Throat: Oropharynx is clear and moist and mucous membranes are normal.  Eyes: Conjunctivae and EOM are normal. Pupils are equal, round, and reactive to light.  Neck: No tracheal deviation present. No thyroid mass and no thyromegaly present.  Cardiovascular: Normal rate and regular rhythm.   No murmur heard. Pulses:      Dorsalis pedis pulses are 2+ on the right side, and 2+ on the left side.  Respiratory: Effort normal and breath sounds normal. No respiratory distress.  GI: Soft. She exhibits no mass. There is no hepatomegaly. There is no tenderness.  Musculoskeletal: She exhibits no edema or tenderness.  Lymphadenopathy:    She has cervical adenopathy.       Right cervical: Posterior cervical adenopathy present.       Left cervical: No posterior cervical adenopathy present.       Right: No supraclavicular adenopathy present.       Left: No supraclavicular adenopathy present.  3 cm, no mobile, no tender.  Neurological: She is alert and oriented to person, place, and time. She has normal strength. Coordination and gait normal.  Skin: Skin is warm. No rash noted. No erythema.  Psychiatric: Her mood appears anxious.  Well groomed, good eye contact.    ASSESSMENT AND PLAN:   Ms. Marinell was seen today for establish  care.  Diagnoses and all orders for this visit:  Lab Results  Component Value Date   CREATININE 0.98 04/12/2017   BUN 16 04/12/2017   NA 138 04/12/2017   K 4.2 04/12/2017   CL 102 04/12/2017   CO2 28 04/12/2017   Lab Results  Component Value Date   MICROALBUR <0.7 04/12/2017   Lab Results  Component Value Date   WBC 10.0 04/12/2017   HGB 14.4 04/12/2017   HCT 42.6 04/12/2017   MCV 92.7 04/12/2017   PLT 346.0 04/12/2017    Gastroesophageal reflux disease, esophagitis presence not specified  GERD precautions to continue. We discussed some side effects of PPI's, she is not interested in trying. She feels like Famotidine is helping.  She would like EGD, which she can discuss with GI.  -     Ambulatory referral to Gastroenterology  Abnormal renal function  Some side effects of Keppra discussed, medications could cause AKI, I am not sure if prolonged use can increase risk of CKD. But at this point risk vs benefit needs to be taken  into account before she decides to stop Keppra. Recommend discussion with her neurologists. Avoid NSAID's. Low salt diet and adequate hydration. We could consider ACEI or ARB.  Further recommendations will be given according to lab results.   -     Basic metabolic panel -     Microalbumin / creatinine urine ratio  Colon cancer screening -     Ambulatory referral to Gastroenterology   Cervical mass  On examination today right cervical mass was found. She has not noted it before but very concerned about the possibility of malignancy. After discussion of some options: Monitoring, soft tissue US, or neck CT she would like neck CT.  -     CBC with Differential/Platelet -     CT Soft Tissue Neck W Contrast; Future  Need for Tdap vaccination -     Tdap vaccine greater than or equal to 7yo IM  Class 1 obesity without serious comorbidity with body mass index (BMI) of 33.0 to 33.9 in adult, unspecified obesity type  We discussed benefits of  wt loss as well as adverse effects of obesity. Consistency with healthy diet and physical activity recommended. Brisk walking for 15-30 min as tolerated. Caution advise in regard to OTC wt loss products.   In regard to Zoster vaccine, she would like to call her health insurance company and find out about coverage.    Azion Centrella G. Martinique, MD  [facility]. [place] office.

## 2017-04-12 NOTE — Patient Instructions (Signed)
A few things to remember from today's visit:   Colon cancer screening - Plan: Ambulatory referral to Gastroenterology, Basic metabolic panel, Microalbumin / creatinine urine ratio  Abnormal renal function - Plan: Basic metabolic panel, Microalbumin / creatinine urine ratio  Cervical mass - Plan: CBC with Differential/Platelet, CT Soft Tissue Neck W Contrast   Please be sure medication list is accurate. If a new problem present, please set up appointment sooner than planned today.

## 2017-04-13 ENCOUNTER — Encounter: Payer: Self-pay | Admitting: Family Medicine

## 2017-04-16 ENCOUNTER — Encounter: Payer: Self-pay | Admitting: Family Medicine

## 2017-04-19 NOTE — Telephone Encounter (Signed)
Can you take a look at this?   Thanks!

## 2017-04-20 ENCOUNTER — Encounter: Payer: Self-pay | Admitting: Family Medicine

## 2017-04-22 NOTE — Telephone Encounter (Signed)
Can you please take a look at this?   Thank you!

## 2017-04-23 ENCOUNTER — Ambulatory Visit (INDEPENDENT_AMBULATORY_CARE_PROVIDER_SITE_OTHER)
Admission: RE | Admit: 2017-04-23 | Discharge: 2017-04-23 | Disposition: A | Discharge status: Home / Self Care | Payer: PPO | Source: Ambulatory Visit | Attending: Family Medicine | Admitting: Family Medicine

## 2017-04-23 DIAGNOSIS — N888 Other specified noninflammatory disorders of cervix uteri: Secondary | ICD-10-CM | POA: Diagnosis not present

## 2017-04-23 DIAGNOSIS — R221 Localized swelling, mass and lump, neck: Secondary | ICD-10-CM | POA: Diagnosis not present

## 2017-04-23 MED ORDER — IOPAMIDOL (ISOVUE-300) INJECTION 61%: 75.0000 mL | Freq: Once | INTRAVENOUS | Status: DC | PRN

## 2017-04-26 ENCOUNTER — Encounter: Payer: Self-pay | Admitting: Acute Care

## 2017-04-26 ENCOUNTER — Encounter: Payer: Self-pay | Admitting: Family Medicine

## 2017-04-29 NOTE — Telephone Encounter (Signed)
SG  Please see pt email

## 2017-05-03 NOTE — Telephone Encounter (Signed)
I called pt to get her scheduled to go over results. Pt refused SG's next available. Pt wants SG to call her to go over the results. She would like to be called on [phone].

## 2017-05-06 ENCOUNTER — Telehealth: Payer: Self-pay | Admitting: Acute Care

## 2017-05-06 NOTE — Telephone Encounter (Signed)
Judson Roch, would you mind calling this pt?

## 2017-05-07 ENCOUNTER — Encounter: Payer: Self-pay | Admitting: Family Medicine

## 2017-05-07 DIAGNOSIS — Z1211 Encounter for screening for malignant neoplasm of colon: Secondary | ICD-10-CM | POA: Diagnosis not present

## 2017-05-07 DIAGNOSIS — K209 Esophagitis, unspecified: Secondary | ICD-10-CM | POA: Diagnosis not present

## 2017-05-07 DIAGNOSIS — K317 Polyp of stomach and duodenum: Secondary | ICD-10-CM | POA: Diagnosis not present

## 2017-05-07 DIAGNOSIS — K640 First degree hemorrhoids: Secondary | ICD-10-CM | POA: Diagnosis not present

## 2017-05-07 DIAGNOSIS — R12 Heartburn: Secondary | ICD-10-CM | POA: Diagnosis not present

## 2017-05-07 DIAGNOSIS — K635 Polyp of colon: Secondary | ICD-10-CM | POA: Diagnosis not present

## 2017-05-07 LAB — HM COLONOSCOPY

## 2017-05-07 NOTE — Telephone Encounter (Signed)
Ria Comment,  I'm not really sure what this patient wants. We have already answered her questions at a previous appointment. However she has further questions we certainly want to make sure there answered. Please go ahead and schedule her for an appointment on Wednesday, as I don't see office visits on Fridays. Thank so much.  Melbourne Abts with pt. She has been scheduled to see SG on 05/08/17 at 10:30am. Nothing further was needed.

## 2017-05-08 ENCOUNTER — Telehealth: Payer: Self-pay | Admitting: Acute Care

## 2017-05-08 ENCOUNTER — Encounter: Payer: PPO | Admitting: Acute Care

## 2017-05-08 NOTE — Telephone Encounter (Signed)
Pt has been set up with an appointment with SG today. Nothing further was needed.

## 2017-05-09 DIAGNOSIS — K635 Polyp of colon: Secondary | ICD-10-CM | POA: Diagnosis not present

## 2017-05-09 DIAGNOSIS — Z1211 Encounter for screening for malignant neoplasm of colon: Secondary | ICD-10-CM | POA: Diagnosis not present

## 2017-05-09 NOTE — Telephone Encounter (Signed)
Will forward to lung nodule pool.  

## 2017-05-09 NOTE — Telephone Encounter (Signed)
Judson Roch, would you like this pt to be rescheduled?

## 2017-05-10 NOTE — Telephone Encounter (Signed)
Yes, we can re-schedule  her. Thanks

## 2017-05-14 ENCOUNTER — Encounter: Payer: Self-pay | Admitting: Family Medicine

## 2017-05-15 ENCOUNTER — Ambulatory Visit (INDEPENDENT_AMBULATORY_CARE_PROVIDER_SITE_OTHER): Payer: PPO | Admitting: Family Medicine

## 2017-05-15 ENCOUNTER — Telehealth: Payer: Self-pay | Admitting: Acute Care

## 2017-05-15 ENCOUNTER — Ambulatory Visit (INDEPENDENT_AMBULATORY_CARE_PROVIDER_SITE_OTHER): Payer: PPO | Admitting: Acute Care

## 2017-05-15 ENCOUNTER — Encounter: Payer: PPO | Admitting: Acute Care

## 2017-05-15 ENCOUNTER — Encounter: Payer: Self-pay | Admitting: Acute Care

## 2017-05-15 ENCOUNTER — Encounter: Payer: Self-pay | Admitting: Family Medicine

## 2017-05-15 VITALS — BP 110/72 | HR 84 | Resp 12 | Wt 208.4 lb

## 2017-05-15 DIAGNOSIS — Z720 Tobacco use: Secondary | ICD-10-CM | POA: Diagnosis not present

## 2017-05-15 DIAGNOSIS — F419 Anxiety disorder, unspecified: Secondary | ICD-10-CM

## 2017-05-15 DIAGNOSIS — R221 Localized swelling, mass and lump, neck: Secondary | ICD-10-CM

## 2017-05-15 DIAGNOSIS — R911 Solitary pulmonary nodule: Secondary | ICD-10-CM

## 2017-05-15 DIAGNOSIS — G40909 Epilepsy, unspecified, not intractable, without status epilepticus: Secondary | ICD-10-CM | POA: Diagnosis not present

## 2017-05-15 DIAGNOSIS — R944 Abnormal results of kidney function studies: Secondary | ICD-10-CM

## 2017-05-15 DIAGNOSIS — E041 Nontoxic single thyroid nodule: Secondary | ICD-10-CM | POA: Diagnosis not present

## 2017-05-15 NOTE — Telephone Encounter (Signed)
I have called Desiree Snow with further explanation of her CT chest without contrast  done in May 2018, in comparison to the CT soft tissue and neck with contrast done in July 2018. Dr. Orlene Plum reviewed by scans. Per Dr. Orlene Plum  The Nodule is unchanged.  It is subpleural in location, associated with the R major fissure.  For subpleural nodules of this size, we commonly disregard them outright, as nearly all are benign subpleural lymph nodes.  And, there is no lymphadenopathy.     I told her we have her scheduled for her annual screening in May 2019, where there will be follow-up and continued surveillance. Additionally I have reminded her that if she has weight loss that is unexplained, or hemoptysis to call us so that we can screen her sooner. Patient verbalized understanding and had no further questions at completion of the call.

## 2017-05-15 NOTE — Progress Notes (Signed)
History of Present Illness Desiree Snow is a 66 y.o. female current smoker seen through the lung cancer screening program.She is an NP.   05/15/2017 Pt. Presents for follow up and explanation of LDCT done in 02/2017. Initial screening scan read as a Lung Rads 1, negative scan.She was seen by her PCP after her baseline scan and was noted to have right cervical adenopathy upon exam, non mobile/ non-tender. CT Soft tissue of the neck with contrast was ordered. That  scan completed 04/23/17 noted a  Partially visualized 3 mm right upper lobe nodule. The patient presents today wanting explanation of why this was not noted on her prior exam in May. I spent 20 minutes discussing this with the patient. I explained that this nodule may not have been present in May when the first scan was done.We also discussed that pulmonary nodules are often benign. I explained that there is a possibility that this noted nodule was the result of an infection of inflammation. She could not recall that she had any acute issues leading up to the scan. We discussed that a 3 mm pulmonary nodule is very small and per the radiologist who read the July scan there was no follow up recommended.I  did offer the patient PFT's and pulmonary consult for her  mild biapical pleural-parenchymal scarring.She stated that she was asymptomatic, and therefore was not interested.  We have her scheduled for her annual scan 02/2018. She denies fever, chest pain, orthopnea or hemoptysis.    Test Results:  02/15/2017:LDCT Mediastinum/Nodes: No suspicious mediastinal lymphadenopathy. Lungs/Pleura: Mild biapical pleural-parenchymal scarring. No suspicious pulmonary nodules. Lung-RADS 1, negative. Continue annual screening with low-dose chest CT without contrast in 12 months.  04/23/2017: CT soft tissue neck with contrast: Upper chest: Partially visualized 3 mm right upper lobe nodule, likely unchanged from the prior CT (series 4, image  42).   CBC Latest Ref Rng & Units 04/12/2017 11/22/2016  WBC 4.0 - 10.5 K/uL 10.0 10.0  Hemoglobin 12.0 - 15.0 g/dL 14.4 14.2  Hematocrit 36.0 - 46.0 % 42.6 42  Platelets 150.0 - 400.0 K/uL 346.0 319    BMP Latest Ref Rng & Units 04/12/2017 11/22/2016 03/09/2016  Glucose 70 - 99 mg/dL 99 - -  BUN 6 - 23 mg/dL 16 16 15   Creatinine 0.40 - 1.20 mg/dL 0.98 1.0 1.0  Sodium 135 - 145 mEq/L 138 140 139  Potassium 3.5 - 5.1 mEq/L 4.2 4.4 4.4  Chloride 96 - 112 mEq/L 102 - -  CO2 19 - 32 mEq/L 28 - -  Calcium 8.4 - 10.5 mg/dL 9.5 - -     Ct Soft Tissue Neck W Contrast  Result Date: 04/23/2017 CLINICAL DATA:  Posterior right cervical mass. Non mobile and nontender. EXAM: CT NECK WITH CONTRAST TECHNIQUE: Multidetector CT imaging of the neck was performed using the standard protocol following the bolus administration of intravenous contrast. CONTRAST:  75 mL Isovue-300 COMPARISON:  Chest CT 02/15/2017. FINDINGS: Pharynx and larynx: Symmetric pharyngeal soft tissues without mass identified. Unremarkable larynx. Salivary glands: No inflammation, mass, or stone. Thyroid: Subcentimeter thyroid nodules bilaterally, not large enough to warrant routine ultrasound evaluation. Lymph nodes: Scattered subcentimeter/ normal sized anterior cervical lymph nodes are present bilaterally. There is also a right level Va lymph node which measures 4 mm in short axis. No suspicious lymph nodes are identified. Vascular: Major vascular structures of the neck are patent. Limited intracranial: Partially visualized postoperative changes from right temporal lobectomy. Visualized orbits: Unremarkable. Mastoids and visualized paranasal  sinuses: Clear. Skeleton: Mild disc and moderate facet degeneration in the cervical spine. Upper chest: Partially visualized 3 mm right upper lobe nodule, likely unchanged from the prior CT (series 4, image 42). Other: None. IMPRESSION: No neck mass or suspicious lymph nodes identified. Scattered small lymph  nodes including a 4 mm right posterior triangle node. Electronically Signed   By: Logan Bores M.D.   On: 04/23/2017 13:16     Past medical hx Past Medical History:  Diagnosis Date  . Allergy   . Chicken pox   . Diverticulitis   . GERD (gastroesophageal reflux disease)   . Seizures ([place])    resolved     Social History  Substance Use Topics  . Smoking status: Former Smoker    Packs/day: 2.00    Years: 29.00    Types: Cigarettes    Quit date: 10/08/2002  . Smokeless tobacco: Never Used  . Alcohol use Not on file    Desiree Snow reports that she quit smoking about 14 years ago. Her smoking use included Cigarettes. She has a 58.00 pack-year smoking history. She has never used smokeless tobacco. She reports that she does not use drugs.  Tobacco Cessation: Counseling given: Yes   Past surgical hx, Family hx, Social hx all reviewed.  Current Outpatient Prescriptions on File Prior to Visit  Medication Sig  . Biotin 1000 MCG tablet Take 1,000 mcg by mouth daily.  . Calcium Carbonate-Vit D-Min (CALCIUM 1200 PO) Take 1 tablet by mouth daily.  . Cholecalciferol (VITAMIN D3) 1000 units CAPS Take 1 capsule by mouth daily.  Marland Kitchen CINNAMON PO Take 1 tablet by mouth daily.  . famotidine (PEPCID) 20 MG tablet Take 20 mg by mouth 2 (two) times daily.  Marland Kitchen levETIRAcetam (KEPPRA) 500 MG tablet Take 500 mg by mouth 2 (two) times daily.  . Magnesium 250 MG TABS Take 1 tablet by mouth daily.  . Multiple Vitamin (MULTIVITAMIN) tablet Take 1 tablet by mouth daily.  . TURMERIC PO Take 1 tablet by mouth daily.  . Zinc 50 MG CAPS Take 1 capsule by mouth daily.   No current facility-administered medications on file prior to visit.      Allergies  Allergen Reactions  . Quinolones   . Scopolamine Hives     on her fingers no itchng  . Penicillins Rash    Review Of Systems:  Constitutional:   No  weight loss, night sweats,  Fevers, chills, fatigue, or  lassitude.  HEENT:   No headaches,   Difficulty swallowing,  Tooth/dental problems, or  Sore throat,                No sneezing, itching, ear ache, nasal congestion, post nasal drip,   CV:  No chest pain,  Orthopnea, PND, swelling in lower extremities, anasarca, dizziness, palpitations, syncope.   GI  No heartburn, indigestion, abdominal pain, nausea, vomiting, diarrhea, change in bowel habits, loss of appetite, bloody stools.   Resp: No shortness of breath with exertion or at rest.  No excess mucus, no productive cough,  No non-productive cough,  No coughing up of blood.  No change in color of mucus.  No wheezing.  No chest wall deformity  Skin: no rash or lesions.  GU: no dysuria, change in color of urine, no urgency or frequency.  No flank pain, no hematuria   MS:  No joint pain or swelling.  No decreased range of motion.  No back pain.  Psych:  No change in mood or affect.  No depression or anxiety.  No memory loss.   Vital Signs There were no vitals taken for this visit.   Physical Exam:  General- No distress,  A&Ox3, anxious ENT: No sinus tenderness, TM clear, pale nasal mucosa, no oral exudate,no post nasal drip, no LAN Cardiac: S1, S2, regular rate and rhythm, no murmur Chest: No wheeze/ rales/ dullness; no accessory muscle use, no nasal flaring, no sternal retractions Abd.: Soft Non-tender, obese Ext: No clubbing cyanosis, edema Neuro:  normal strength, cranial nerves intact Skin: No rashes, warm and dry Psych: anxious and agitated   Assessment/Plan  Tobacco abuse Current every day smoker  Followed by Lung Cancer Screening Program Plan: Annual screening scan 02/2018 Counseling on smoking cessation.  Incidental pulmonary nodule Incidental finding of 3 mm non-concerning Pulmonary nodule  on  Contrast CT of neck 04/2017 Pt. Concerned it was not noted on 02/2017 Non-Contrast LDCT for Lung cancer screening  02/15/2017:LDCT Mediastinum/Nodes: No suspicious mediastinal lymphadenopathy. Lungs/Pleura: Mild  biapical pleural-parenchymal scarring. No suspicious pulmonary nodules. Lung-RADS 1, negative. Continue annual screening with low-dose chest CT without contrast in 12 months. No hemoptysis or unintentional weight loss  Plan: Will have radiology evaluate 02/2017 in comparison to 04/2017  scan purely for patient reassurance  Counseling done for the reasons patients have benign pulmonary nodules and the etiology of benign pulmonary nodules. Re-affirmed that patient has annual  follow up Low Dose Screening scan 02/2018 per recommendation per 02/2017 scan and per Lung Rads guidelines. Please contact office for sooner follow up if symptoms do not improve or worsen or seek emergency care    I spent 20 minutes counseling patient in my office today regarding her LDCT results.  Magdalen Spatz, NP 05/15/2017  12:06 PM

## 2017-05-15 NOTE — Telephone Encounter (Signed)
Desiree Snow,  please make sure this patient is on optical list for follow-up CT in May 2019. Thanks so much

## 2017-05-15 NOTE — Patient Instructions (Addendum)
EPIC was down.

## 2017-05-15 NOTE — Assessment & Plan Note (Signed)
Current every day smoker  Followed by Lung Cancer Screening Program Plan: Annual screening scan 02/2018 Counseling on smoking cessation.

## 2017-05-15 NOTE — Assessment & Plan Note (Addendum)
Incidental finding of 3 mm non-concerning Pulmonary nodule  on  Contrast CT of neck 04/2017 Pt. Concerned it was not noted on 02/2017 Non-Contrast LDCT for Lung cancer screening  02/15/2017:LDCT Mediastinum/Nodes: No suspicious mediastinal lymphadenopathy. Lungs/Pleura: Mild biapical pleural-parenchymal scarring. No suspicious pulmonary nodules. Lung-RADS 1, negative. Continue annual screening with low-dose chest CT without contrast in 12 months. No hemoptysis or unintentional weight loss  Plan: Will have radiology evaluate 02/2017 in comparison to 04/2017  scan purely for patient reassurance  Counseling done for the reasons patients have benign pulmonary nodules and the etiology of benign pulmonary nodules. Re-affirmed that patient has annual  follow up Low Dose Screening scan 02/2018 per recommendation per 02/2017 scan and per Lung Rads guidelines. Please contact office for sooner follow up if symptoms do not improve or worsen or seek emergency care

## 2017-05-15 NOTE — Progress Notes (Signed)
ACUTE VISIT    HPI:  Chief Complaint  Patient presents with  . Discuss recent CT    + other concerns.    [name] is a 66 y.o. female, who is here today concerned about recent neck CT report among other concerns. At the time of her visit EPIC was down and I did not have a copy of report or access to her records.   She was seen on 04/12/17.  Last OV incidentally I palpated right cervical mass, CT with contrast was ordered and no suspicious lesions. She is upset because she can still palpate mass and not satisfy with report. She is afraid something has been missed. She has not noted growth,not tender.  Also very concerned about 3 mm lung nodule reported in neck CT. She recently had low dose chest CT and negative. Former smoker. Denies cough ,chest pain,or dyspnea.  She already saw pulmonologist today and according to pt, she saw Ms Desiree Confer NP. She tells me that Ms Desiree Snow spoke with radiologists and explained that it was a nodule associated to lung fissure and usually benign. She is very concerned about possibility of lung cancer,wonders if she should have chest CT instead screening.  -Also she read report and concerned about thyroid nodule. I do not recall a concerning finding in regard to thyroid gland.She ensures me that nodules were seen and would like further work-up.  Lab Results  Component Value Date   TSH 1.22 11/22/2016    -Last OV she was concerned about abnormal renal function, previously Dx with CKD III. She has no Hx of HTN or DM and denies frequent NSAID use. Last renal function test was normal but she is not satisfied with results. She tells me that she did not have e GFR but rather "GFR" alone. Prior e GFR in the high 50's but she is upset because she wants to know exactly what is causing renal function declining. She think Keppra is causing renal impairment. 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  She has tried Gabapentin, Depakote,Ativan,Primidone,Lamictal,and Dilantin among some.  She follows with Dr Manuella Ghazi  Lab Results  Component Value Date   CREATININE 0.98 04/12/2017   BUN 16 04/12/2017   NA 138 04/12/2017   K 4.2 04/12/2017   CL 102 04/12/2017   CO2 28 04/12/2017   She denies foam in urine,gross hematuria,or decrease urine output.   Review of Systems  Constitutional: Negative for activity change, appetite change, fatigue, fever and unexpected weight change.  HENT: Negative for mouth sores, nosebleeds, sore throat, trouble swallowing and voice change.   Eyes: Negative for redness and visual disturbance.  Respiratory: Negative for cough, shortness of breath and wheezing.   Cardiovascular: Negative for chest pain, palpitations and leg swelling.  Gastrointestinal: Negative for abdominal pain, nausea and vomiting.       Negative for changes in bowel habits.  Endocrine: Negative for cold intolerance and heat intolerance.  Genitourinary: Negative for decreased urine volume, dysuria and hematuria.  Musculoskeletal: Negative for gait problem, neck pain and neck stiffness.  Skin: Negative for pallor and rash.  Neurological: Negative for seizures, syncope, weakness and headaches.  Hematological: Positive for adenopathy. Does not bruise/bleed easily.  Psychiatric/Behavioral: Negative for confusion. The patient is nervous/anxious.       Current Outpatient Prescriptions on  File Prior to Visit  Medication Sig Dispense Refill  . Biotin 1000 MCG tablet Take 1,000 mcg by mouth daily.    . Calcium Carbonate-Vit D-Min (CALCIUM 1200 PO) Take 1 tablet by mouth daily.    . Cholecalciferol (VITAMIN D3) 1000 units CAPS Take 1 capsule by mouth daily.    Marland Kitchen CINNAMON PO Take 1 tablet by mouth daily.    . famotidine (PEPCID) 20 MG tablet Take 20 mg by mouth 2  (two) times daily.    Marland Kitchen levETIRAcetam (KEPPRA) 500 MG tablet Take 500 mg by mouth 2 (two) times daily.    . Magnesium 250 MG TABS Take 1 tablet by mouth daily.    . Multiple Vitamin (MULTIVITAMIN) tablet Take 1 tablet by mouth daily.    . TURMERIC PO Take 1 tablet by mouth daily.    . Zinc 50 MG CAPS Take 1 capsule by mouth daily.     No current facility-administered medications on file prior to visit.      Past Medical History:  Diagnosis Date  . Allergy   . Chicken pox   . Diverticulitis   . GERD (gastroesophageal reflux disease)   . Seizures ([place])    resolved   Allergies  Allergen Reactions  . Quinolones   . Scopolamine Hives     on her fingers no itchng  . Penicillins Rash   Family History  Problem Relation Age of Onset  . Pneumonia Mother   . Dementia Father     Social History   Social History  . Marital status: Single    Spouse name: N/A  . Number of children: N/A  . Years of education: N/A   Social History Main Topics  . Smoking status: Former Smoker    Packs/day: 2.00    Years: 29.00    Types: Cigarettes    Quit date: 10/08/2002  . Smokeless tobacco: Never Used  . Alcohol use None  . Drug use: No  . Sexual activity: Not Asked   Other Topics Concern  . None   Social History Narrative  . None    Vitals:   05/15/17 1723  BP: 110/72  Pulse: 84  Resp: 12  SpO2: 95%  O2 sat at RA 95% Body mass index is 33.63 kg/m.   Physical Exam  Nursing note and vitals reviewed. Constitutional: She is oriented to person, place, and time. She appears well-developed. No distress.  HENT:  Head: Normocephalic and atraumatic.  Mouth/Throat: Oropharynx is clear and moist and mucous membranes are normal.  Eyes: Pupils are equal, round, and reactive to light. Conjunctivae are normal.  Neck: No tracheal deviation present. No thyroid mass and no thyromegaly (palpable upon swallowing.) present.  Cardiovascular: Normal rate and regular rhythm.   No murmur  heard. Pulses:      Dorsalis pedis pulses are 2+ on the right side, and 2+ on the left side.  Respiratory: Effort normal and breath sounds normal. No respiratory distress.  Musculoskeletal: She exhibits no edema.  Lymphadenopathy:       Head (right side): No submandibular adenopathy present.       Head (left side): No submandibular adenopathy present.       Right: No supraclavicular adenopathy present.       Left: No supraclavicular adenopathy present.  Right posterior cervical mass, 2.5-3 cm,no mobile, no tender, and no skin changes.  Neurological: She is alert and oriented to person, place, and time. She has normal strength. Gait normal.  Skin: Skin is warm.  No rash noted. No erythema.  Psychiatric: Her mood appears anxious.  Well groomed, good eye contact.    ASSESSMENT AND PLAN:   Diagnoses and all orders for this visit:  Mass of right side of neck  Recent neck CT:No neck mass or suspicious lymph nodes identified. Scattered small lymph nodes including a 4 mm right posterior triangle node. Very anxious, she would like consultation with "neck" specialists.  ENT evaluation will be arranged.   -     Ambulatory referral to ENT  Thyroid nodule  EPIC was down during visit, reviewed CT results: Thyroid: Subcentimeter thyroid nodules bilaterally, not large enough to warrant routine ultrasound evaluation.[place] is not recommended but given pt's anxiety, imaging was ordered.  -     US THYROID; Future  Abnormal renal function test  I have last BMP on Point of Care Recommendation Report: Renal function was in normal range and e GFR was reported, also normal Cr and BUN. We will continue following.  Incidental pulmonary nodule  Neck CT: Upper chest: Partially visualized 3 mm right upper lobe nodule, likely unchanged from the prior CT (series 4, image 42). We discussed follow up recommendations for lung nodules and usually < 6 mm are very unlikely to malignant lesions and usually not  followed with imaging. I seems like lesion was unchanged when compared with prior imaging and she already followed with pulmonologist.   Anxiety disorder, unspecified type  Very anxious today about different abnormalities found on imaging. Explained that not always we can determine the exact etiology of certain conditions. All abnormalities reported on imaging do not seem worrisome for serious illness and we can continue following and monitoring for changes.  I will consider recommend SSRI medication in future visits.  Seizure disorder ([place])  Some of side effects reviewed and even though AKI is listed I do not think chronic use causes CKD. Still I explained that Keppra use seems to be more beneficial than detrimental.She has not had seizures sine she has been on medication. She has an appt with Dr Manuella Ghazi coming, she can discuss her concerns about medication.     41 min face to face OV. > 50% was dedicated to discussion of all above Dx and concerns. Prognosis of some, work-up options, and some side effects of medications.Extra time was dedicated to reassurance.      Yecenia Dalgleish G. Martinique, MD  [facility]. [place] office.

## 2017-05-16 ENCOUNTER — Encounter: Payer: Self-pay | Admitting: Family Medicine

## 2017-05-20 NOTE — Telephone Encounter (Signed)
Pt was seen in office 05/15/17.  Nothing further needed.  Message closed.

## 2017-05-20 NOTE — Telephone Encounter (Signed)
Order was placed for 1 yr f/u low dose ct 02/2018.  Pt was placed on my reminder list for 02/2018.

## 2017-06-13 ENCOUNTER — Ambulatory Visit
Admission: RE | Admit: 2017-06-13 | Discharge: 2017-06-13 | Disposition: A | Discharge status: Home / Self Care | Payer: PPO | Source: Ambulatory Visit | Attending: Family Medicine | Admitting: Family Medicine

## 2017-06-13 DIAGNOSIS — E041 Nontoxic single thyroid nodule: Secondary | ICD-10-CM

## 2017-06-13 DIAGNOSIS — E042 Nontoxic multinodular goiter: Secondary | ICD-10-CM | POA: Diagnosis not present

## 2017-06-14 ENCOUNTER — Encounter: Payer: Self-pay | Admitting: Family Medicine

## 2017-06-14 DIAGNOSIS — R221 Localized swelling, mass and lump, neck: Secondary | ICD-10-CM | POA: Diagnosis not present

## 2017-06-17 ENCOUNTER — Telehealth: Payer: Self-pay | Admitting: Family Medicine

## 2017-06-17 NOTE — Telephone Encounter (Signed)
Patient called back, wants to see Dr. Cruzita Lederer or Dr. Dwyane Dee. She states that she does not have a referral and does not need one. Patient wants to know how long before she can be scheduled. I transferred the call to Endocrinology to advise.

## 2017-06-17 NOTE — Telephone Encounter (Signed)
Pt is scheduled °

## 2017-06-17 NOTE — Telephone Encounter (Signed)
Patient called to make an appointment for Endocrinology. I advised the patient that [facility] only schedules by referral so that the office can best treat the diagnosis. Patient explained that she had Medicare and that it was not mandatory, I advised her that unfortunately [facility] must have a referral and I would be happy to check to see if one has already been placed. Patient refused.   I asked patient if she would like the fax number so that her PCP could just send the referral, patient refused and stated that she has all the information online, same as she found the phone number. I advised the patient encourage her provider to fax over the referral and Endocrinology would be happy to get her scheduled once received. Patient did not seem happy about needing a referral and the call ended.

## 2017-06-20 ENCOUNTER — Encounter: Payer: Self-pay | Admitting: Endocrinology

## 2017-06-20 ENCOUNTER — Encounter: Payer: Self-pay | Admitting: Acute Care

## 2017-06-20 ENCOUNTER — Ambulatory Visit (INDEPENDENT_AMBULATORY_CARE_PROVIDER_SITE_OTHER): Payer: PPO | Admitting: Endocrinology

## 2017-06-20 DIAGNOSIS — E042 Nontoxic multinodular goiter: Secondary | ICD-10-CM | POA: Diagnosis not present

## 2017-06-20 NOTE — Progress Notes (Signed)
Subjective:    Patient ID: Desiree Snow, female    DOB: March 19, 1951, 66 y.o.   MRN: [id number]  HPI Pt is referred by Dr Martinique, for nodular thyroid.  On a CT scan of the chest  in 2018, pt was incidentally noted to have a goiter.  she is unaware of ever having had thyroid problems in the past.  she has no h/o XRT or surgery to the neck.  She has slight post-nasal drip, and assoc hoarseness.    Past Medical History:  Diagnosis Date  . Allergy   . Chicken pox   . Diverticulitis   . GERD (gastroesophageal reflux disease)   . Seizures ([place])    resolved    Past Surgical History:  Procedure Laterality Date  . ADENOIDECTOMY  1963  . BREAST LUMPECTOMY Left 2009   benign  . LOBECTOMY FOR SEIZURE FOCUS    . SPLENECTOMY  1961  . TONSILLECTOMY  1963  . WISDOM TOOTH EXTRACTION      Social History   Social History  . Marital status: Single    Spouse name: N/A  . Number of children: N/A  . Years of education: N/A   Occupational History  . Not on file.   Social History Main Topics  . Smoking status: Former Smoker    Packs/day: 2.00    Years: 29.00    Types: Cigarettes    Quit date: 10/08/2002  . Smokeless tobacco: Never Used  . Alcohol use Not on file  . Drug use: No  . Sexual activity: Not on file   Other Topics Concern  . Not on file   Social History Narrative  . No narrative on file    Current Outpatient Prescriptions on File Prior to Visit  Medication Sig Dispense Refill  . Biotin 1000 MCG tablet Take 1,000 mcg by mouth daily.    . Calcium Carbonate-Vit D-Min (CALCIUM 1200 PO) Take 1 tablet by mouth daily.    . Cholecalciferol (VITAMIN D3) 1000 units CAPS Take 1 capsule by mouth daily.    Marland Kitchen CINNAMON PO Take 1 tablet by mouth daily.    . famotidine (PEPCID) 20 MG tablet Take 20 mg by mouth 2 (two) times daily.    Marland Kitchen levETIRAcetam (KEPPRA) 500 MG tablet Take 500 mg by mouth 2 (two) times daily.    . Magnesium 250 MG TABS Take 1 tablet by mouth daily.    .  Multiple Vitamin (MULTIVITAMIN) tablet Take 1 tablet by mouth daily.    . TURMERIC PO Take 1 tablet by mouth daily.    . Zinc 50 MG CAPS Take 1 capsule by mouth daily.     No current facility-administered medications on file prior to visit.     Allergies  Allergen Reactions  . Quinolones   . Scopolamine Hives     on her fingers no itchng  . Penicillins Rash    Family History  Problem Relation Age of Onset  . Pneumonia Mother   . Dementia Father   . Goiter Neg Hx   . Thyroid cancer Neg Hx   . Thyroid nodules Neg Hx   . Thyroid disease Neg Hx     BP 102/70   Pulse 83   Wt 212 lb 9.6 oz (96.4 kg)   SpO2 96%   BMI 34.31 kg/m    Review of Systems Denies neck pain, diplopia, chest pain, sob, cough, dysphagia, diarrhea, itching, flushing, easy bruising, depression, cold intolerance, headache, numbness, and rhinorrhea.  She has  chronic weight gain.       Objective:   Physical Exam VS: see vs page GEN: no distress HEAD: head: no deformity eyes: no periorbital swelling, no proptosis external nose and ears are normal mouth: no lesion seen NECK: I can feel 2 thyroid nodules, 1 in each lobe CHEST WALL: no deformity LUNGS: clear to auscultation CV: reg rate and rhythm, no murmur.  ABD: abdomen is soft, nontender.  no hepatosplenomegaly.  not distended.  no hernia MUSCULOSKELETAL: muscle bulk and strength are grossly normal.  no obvious joint swelling.  gait is normal and steady EXTEMITIES: no deformity.  no ulcer on the feet.  feet are of normal color and temp.  no edema PULSES: dorsalis pedis intact bilat.  no carotid bruit NEURO:  cn 2-12 grossly intact.   readily moves all 4's.  sensation is intact to touch on the feet SKIN:  Normal texture and temperature.  No rash or suspicious lesion is visible.   NODES:  None palpable at the neck.  PSYCH: alert, well-oriented.  Does not appear anxious nor depressed.    I have reviewed outside records, and summarized: Pt was noted  to have goiter, and referred here.  Pt had CT for cancer screening.  Pt was concerned about neck mass, but Dr Martinique did not have epic available to address pt's questions.    Lab Results  Component Value Date   TSH 1.22 11/22/2016   [place]: Nodule #4 in the inferior left thyroid lobe and Nodule #5 in the mid left thyroid lobe both meet criteria for biopsy.  Nodule #3 in the superior left thyroid lobe meets criteria for 1 year follow-up.     Assessment & Plan:  Multinodular goiter, new, euthyroid: w/u needed.    Patient Instructions   Let's check the biopsy, guided by the ultrasound.  you will receive a phone call, about a day and time for an appointment. We'll let you know about the results. If as expected, no cancer is found, Please come back for a follow-up appointment in 6-12 months. most of the time, a "lumpy thyroid" will eventually become overactive.  this is usually a slow process, happening over the span of many years.

## 2017-06-20 NOTE — Telephone Encounter (Signed)
SG please advise. Thanks   

## 2017-06-20 NOTE — Patient Instructions (Signed)
  Let's check the biopsy, guided by the ultrasound.  you will receive a phone call, about a day and time for an appointment. We'll let you know about the results. If as expected, no cancer is found, Please come back for a follow-up appointment in 6-12 months. most of the time, a "lumpy thyroid" will eventually become overactive.  this is usually a slow process, happening over the span of many years.

## 2017-06-21 ENCOUNTER — Telehealth: Payer: Self-pay | Admitting: Endocrinology

## 2017-06-21 DIAGNOSIS — E042 Nontoxic multinodular goiter: Secondary | ICD-10-CM

## 2017-06-21 NOTE — Telephone Encounter (Signed)
done

## 2017-06-21 NOTE — Telephone Encounter (Signed)
[facility] imaging called in reference to needing second order exam in epic for thyroid biopsy since 2 nodules are needing biopsied. [id number]. Please call Latisha at [phone] and advise.

## 2017-06-24 ENCOUNTER — Encounter: Payer: Self-pay | Admitting: Endocrinology

## 2017-06-25 ENCOUNTER — Encounter: Payer: Self-pay | Admitting: Family Medicine

## 2017-06-25 DIAGNOSIS — G40909 Epilepsy, unspecified, not intractable, without status epilepticus: Secondary | ICD-10-CM | POA: Diagnosis not present

## 2017-06-27 ENCOUNTER — Ambulatory Visit
Admission: RE | Admit: 2017-06-27 | Discharge: 2017-06-27 | Disposition: A | Discharge status: Home / Self Care | Payer: PPO | Source: Ambulatory Visit | Attending: Endocrinology | Admitting: Endocrinology

## 2017-06-27 ENCOUNTER — Other Ambulatory Visit (HOSPITAL_COMMUNITY)
Admission: RE | Admit: 2017-06-27 | Discharge: 2017-06-27 | Disposition: A | Discharge status: Home / Self Care | Payer: PPO | Source: Ambulatory Visit | Attending: Diagnostic Radiology | Admitting: Diagnostic Radiology

## 2017-06-27 ENCOUNTER — Encounter: Payer: Self-pay | Admitting: Family Medicine

## 2017-06-27 DIAGNOSIS — E041 Nontoxic single thyroid nodule: Secondary | ICD-10-CM | POA: Diagnosis not present

## 2017-06-27 DIAGNOSIS — E042 Nontoxic multinodular goiter: Secondary | ICD-10-CM

## 2017-08-26 ENCOUNTER — Encounter: Payer: Self-pay | Admitting: Family Medicine

## 2017-08-26 ENCOUNTER — Ambulatory Visit: Payer: PPO | Admitting: Family Medicine

## 2017-08-26 VITALS — BP 122/78 | HR 77 | Temp 99.0°F | Resp 17 | Wt 212.4 lb

## 2017-08-26 DIAGNOSIS — B9689 Other specified bacterial agents as the cause of diseases classified elsewhere: Secondary | ICD-10-CM | POA: Diagnosis not present

## 2017-08-26 DIAGNOSIS — J9801 Acute bronchospasm: Secondary | ICD-10-CM

## 2017-08-26 DIAGNOSIS — J208 Acute bronchitis due to other specified organisms: Secondary | ICD-10-CM

## 2017-08-26 MED ORDER — PREDNISONE 20 MG PO TABS: 40.0000 mg | ORAL_TABLET | Freq: Every day | ORAL | 0 refills | Status: AC

## 2017-08-26 MED ORDER — DOXYCYCLINE HYCLATE 100 MG PO TABS: 100.0000 mg | ORAL_TABLET | Freq: Two times a day (BID) | ORAL | 0 refills | Status: AC

## 2017-08-26 MED ORDER — ALBUTEROL SULFATE HFA 108 (90 BASE) MCG/ACT IN AERS: 2.0000 | INHALATION_SPRAY | Freq: Four times a day (QID) | RESPIRATORY_TRACT | 0 refills | Status: DC | PRN

## 2017-08-26 NOTE — Patient Instructions (Signed)
Acute Bronchitis, Adult Acute bronchitis is when air tubes (bronchi) in the lungs suddenly get swollen. The condition can make it hard to breathe. It can also cause these symptoms:  A cough.  Coughing up clear, yellow, or green mucus.  Wheezing.  Chest congestion.  Shortness of breath.  A fever.  Body aches.  Chills.  A sore throat.  Follow these instructions at home: Medicines  Take over-the-counter and prescription medicines only as told by your doctor.  If you were prescribed an antibiotic medicine, take it as told by your doctor. Do not stop taking the antibiotic even if you start to feel better. General instructions  Rest.  Drink enough fluids to keep your pee (urine) clear or pale yellow.  Avoid smoking and secondhand smoke. If you smoke and you need help quitting, ask your doctor. Quitting will help your lungs heal faster.  Use an inhaler, cool mist vaporizer, or humidifier as told by your doctor.  Keep all follow-up visits as told by your doctor. This is important. How is this prevented? To lower your risk of getting this condition again:  Wash your hands often with soap and water. If you cannot use soap and water, use hand sanitizer.  Avoid contact with people who have cold symptoms.  Try not to touch your hands to your mouth, nose, or eyes.  Make sure to get the flu shot every year.  Contact a doctor if:  Your symptoms do not get better in 2 weeks. Get help right away if:  You cough up blood.  You have chest pain.  You have very bad shortness of breath.  You become dehydrated.  You faint (pass out) or keep feeling like you are going to pass out.  You keep throwing up (vomiting).  You have a very bad headache.  Your fever or chills gets worse. This information is not intended to replace advice given to you by your health care provider. Make sure you discuss any questions you have with your health care provider. Document Released:  03/12/2008 Document Revised: 05/02/2016 Document Reviewed: 03/14/2016 Elsevier Interactive Patient Education  2017 [employer]. How to Use a Metered Dose Inhaler A metered dose inhaler is a handheld device for taking medicine that must be breathed into the lungs (inhaled). The device can be used to deliver a variety of inhaled medicines, including:  Quick relief or rescue medicines, such as bronchodilators.  Controller medicines, such as corticosteroids.  The medicine is delivered by pushing down on a metal canister to release a preset amount of spray and medicine. Each device contains the amount of medicine that is needed for a preset number of uses (inhalations). Your health care provider may recommend that you use a spacer with your inhaler to help you take the medicine more effectively. A spacer is a plastic tube with a mouthpiece on one end and an opening that connects to the inhaler on the other end. A spacer holds the medicine in a tube for a short time, which allows you to inhale more medicine. What are the risks? If you do not use your inhaler correctly, medicine might not reach your lungs to help you breathe. Inhaler medicine can cause side effects, such as:  Mouth or throat infection.  Cough.  Hoarseness.  Headache.  Nausea and vomiting.  Lung infection (pneumonia) in people who have a lung condition called COPD.  How to use a metered dose inhaler without a spacer 1. Remove the cap from the inhaler. 2. If  you are using the inhaler for the first time, shake it for 5 seconds, turn it away from your face, then release 4 puffs into the air. This is called priming. 3. Shake the inhaler for 5 seconds. 4. Position the inhaler so the top of the canister faces up. 5. Put your index finger on the top of the medicine canister. Support the bottom of the inhaler with your thumb. 6. Breathe out normally and as completely as possible, away from the inhaler. 7. Either place the inhaler  between your teeth and close your lips tightly around the mouthpiece, or hold the inhaler 1-2 inches (2.5-5 cm) away from your open mouth. Keep your tongue down out of the way. If you are unsure which technique to use, ask your health care provider. 8. Press the canister down with your index finger to release the medicine, then inhale deeply and slowly through your mouth (not your nose) until your lungs are completely filled. Inhaling should take 4-6 seconds. 9. Hold the medicine in your lungs for 5-10 seconds (10 seconds is best). This helps the medicine get into the small airways of your lungs. 10. With your lips in a tight circle (pursed), breathe out slowly. 11. Repeat steps 3-10 until you have taken the number of puffs that your health care provider directed. Wait about 1 minute between puffs or as directed. 12. Put the cap on the inhaler. 13. If you are using a steroid inhaler, rinse your mouth with water, gargle, and spit out the water. Do not swallow the water. How to use a metered dose inhaler with a spacer 1. Remove the cap from the inhaler. 2. If you are using the inhaler for the first time, shake it for 5 seconds, turn it away from your face, then release 4 puffs into the air. This is called priming. 3. Shake the inhaler for 5 seconds. 4. Place the open end of the spacer onto the inhaler mouthpiece. 5. Position the inhaler so the top of the canister faces up and the spacer mouthpiece faces you. 6. Put your index finger on the top of the medicine canister. Support the bottom of the inhaler and the spacer with your thumb. 7. Breathe out normally and as completely as possible, away from the spacer. 8. Place the spacer between your teeth and close your lips tightly around it. Keep your tongue down out of the way. 9. Press the canister down with your index finger to release the medicine, then inhale deeply and slowly through your mouth (not your nose) until your lungs are completely filled.  Inhaling should take 4-6 seconds. 10. Hold the medicine in your lungs for 5-10 seconds (10 seconds is best). This helps the medicine get into the small airways of your lungs. 11. With your lips in a tight circle (pursed), breathe out slowly. 12. Repeat steps 3-11 until you have taken the number of puffs that your health care provider directed. Wait about 1 minute between puffs or as directed. 13. Remove the spacer from the inhaler and put the cap on the inhaler. 14. If you are using a steroid inhaler, rinse your mouth with water, gargle, and spit out the water. Do not swallow the water. Follow these instructions at home:  Take your inhaled medicine only as told by your health care provider. Do not use the inhaler more than directed by your health care provider.  Keep all follow-up visits as told by your health care provider. This is important.  If your  inhaler has a counter, you can check it to determine how full your inhaler is. If your inhaler does not have a counter, ask your health care provider when you will need to refill your inhaler and write the refill date on a calendar or on your inhaler canister. Note that you cannot know when an inhaler is empty by shaking it.  Follow directions on the package insert for care and cleaning of your inhaler and spacer. Contact a health care provider if:  Symptoms are only partially relieved with your inhaler.  You are having trouble using your inhaler.  You have an increase in phlegm.  You have headaches. Get help right away if:  You feel little or no relief after using your inhaler.  You have dizziness.  You have a fast heart rate.  You have chills or a fever.  You have night sweats.  There is blood in your phlegm. Summary  A metered dose inhaler is a handheld device for taking medicine that must be breathed into the lungs (inhaled).  The medicine is delivered by pushing down on a metal canister to release a preset amount of spray  and medicine.  Each device contains the amount of medicine that is needed for a preset number of uses (inhalations). This information is not intended to replace advice given to you by your health care provider. Make sure you discuss any questions you have with your health care provider. Document Released: 09/24/2005 Document Revised: 08/14/2016 Document Reviewed: 08/14/2016 Elsevier Interactive Patient Education  2017 [employer].

## 2017-08-26 NOTE — Progress Notes (Signed)
Assessment  1. Acute bacterial bronchitis   2. Bronchospasm      Plan  Discussion:  Given patient's history and physical exam most likely diagnosis is bronchitis however she has a mildly decreased oxygenation and her symptoms are persistent.  She also has wheezing on exam.  We discussed the use of covering for bacterial etiology with doxycycline twice a day.  She has tolerated this well in the past.  We also discussed management of her bronchospasm with albuterol inhaler and a brief course of prednisone burst.  Education was given on how to use an inhaler if needed..Supportive care measures are recommended.  We discussed the use of mucolytic's, decongestants, antihistamines and antitussives as needed.  Tylenol or Advil are recommended if needed.  Patient is to return if her symptoms worsen in any way.  Follow-up as needed for any worsening symptoms or changes in condition.  Commons side effects, risks, benefits, and alternatives for medications and treatment plan prescribed today were discussed, and the patient expressed understanding of the given instructions. Patient is instructed to call or message via MyChart if he/she has any questions or concerns regarding our treatment plan. No barriers to understanding were identified. We discussed Red Flag symptoms and signs in detail. Patient expressed understanding regarding what to do in case of urgent or emergency type symptoms.  Medication list was reconciled, printed and provided to the patient in AVS. Patient instructions and summary information was reviewed with the patient as documented in the AVS. This note was prepared with assistance of Dragon voice recognition software. Occasional wrong-word or sound-a-like substitutions may have occurred due to the inherent limitations of voice recognition software  No orders of the defined types were placed in this encounter.  Current Meds  Medication Sig  . Biotin 1000 MCG tablet Take 1,000 mcg by mouth  daily.  . Calcium Carbonate-Vit D-Min (CALCIUM 1200 PO) Take 1 tablet by mouth daily.  . Cholecalciferol (VITAMIN D3) 1000 units CAPS Take 1 capsule by mouth daily.  Marland Kitchen CINNAMON PO Take 1 tablet by mouth daily.  . clorazepate (TRANXENE) 3.75 MG tablet Take 1 tablet daily as needed by mouth.  . famotidine (PEPCID) 20 MG tablet Take 20 mg by mouth 2 (two) times daily.  Marland Kitchen levETIRAcetam (KEPPRA) 500 MG tablet Take 500 mg by mouth 2 (two) times daily.  . Magnesium 250 MG TABS Take 1 tablet by mouth daily.  . Multiple Vitamin (MULTIVITAMIN) tablet Take 1 tablet by mouth daily.  . TURMERIC PO Take 1 tablet by mouth daily.  . Zinc 50 MG CAPS Take 1 capsule by mouth daily.      Subjective   CC:  Chief Complaint  Patient presents with  . Sinusitis  . Chest Congestion  . Shortness of Breath    HPI: SUBJECTIVE:  Desiree Snow is a 66 y.o. female who complains of congestion, nasal blockage, post nasal drip, cough described as harsh and productive and denies sinus, high fevers, chest pain or significant GI symptoms. Symptoms have been present for 7 days. Inspite of otc medications, symptoms persist. Now with some SOB due to wheezing with walking.  She denies a history of anorexia, dizziness, vomiting and wheezing. She denies a history of asthma or COPD. Patient does not smoke cigarettes (former smoker, does not endorse COPD sxs). Has never used an inhaler before. Has used prednisone in past - side effect: mild jitteriness..  I reviewed the patients updated PMH, FH, and SocHx.    Patient Active Problem List  Diagnosis Date Noted  . Multinodular goiter 06/20/2017  . Tobacco abuse 05/15/2017  . Incidental pulmonary nodule 05/15/2017  . Seizure disorder ([place]) 05/15/2017  . GERD (gastroesophageal reflux disease) 04/12/2017  . Class 1 obesity with body mass index (BMI) of 33.0 to 33.9 in adult 04/12/2017  . IFG (impaired fasting glucose) 04/12/2017    Review of Systems: Cardiovascular:  negative for chest pain Respiratory: negativeor hemoptysis Gastrointestinal: negative for abdominal pain, no n/v/d Genitourinary: negative for dysuria or gross hematuria  Objective  Vitals: BP 122/78 (BP Location: Right Arm, Patient Position: Sitting, Cuff Size: Large)   Pulse 77   Temp 99 F (37.2 C) (Oral)   Resp 17   Wt 212 lb 6 oz (96.3 kg)   SpO2 93%   BMI 34.28 kg/m  General: no acute distress , speaking in clear sentences Psych:  Alert and oriented, normal mood and affect HEENT:  Normocephalic, atraumatic, supple neck, moist mucous membranes, mildly erythematous pharynx without exudate, mild lymphadenopathy, supple neck Cardiovascular:  RRR without murmur. no edema Respiratory:  Good breath sounds bilaterally,  normal respiratory effort with diffuse rhonchi and mild exp wheeze, no retractions, no tachypnea.  Skin:  clammy, no rashes Neurologic:   Mental status is normal. normal gait

## 2017-10-14 DIAGNOSIS — L578 Other skin changes due to chronic exposure to nonionizing radiation: Secondary | ICD-10-CM | POA: Diagnosis not present

## 2017-10-14 DIAGNOSIS — L7211 Pilar cyst: Secondary | ICD-10-CM | POA: Diagnosis not present

## 2017-10-14 DIAGNOSIS — L814 Other melanin hyperpigmentation: Secondary | ICD-10-CM | POA: Diagnosis not present

## 2017-10-14 DIAGNOSIS — D1721 Benign lipomatous neoplasm of skin and subcutaneous tissue of right arm: Secondary | ICD-10-CM | POA: Diagnosis not present

## 2017-10-14 DIAGNOSIS — L853 Xerosis cutis: Secondary | ICD-10-CM | POA: Diagnosis not present

## 2017-10-14 DIAGNOSIS — L821 Other seborrheic keratosis: Secondary | ICD-10-CM | POA: Diagnosis not present

## 2017-10-14 DIAGNOSIS — D1801 Hemangioma of skin and subcutaneous tissue: Secondary | ICD-10-CM | POA: Diagnosis not present

## 2017-10-14 DIAGNOSIS — D170 Benign lipomatous neoplasm of skin and subcutaneous tissue of head, face and neck: Secondary | ICD-10-CM | POA: Diagnosis not present

## 2017-10-17 DIAGNOSIS — R69 Illness, unspecified: Secondary | ICD-10-CM | POA: Diagnosis not present

## 2017-12-02 ENCOUNTER — Other Ambulatory Visit: Payer: Self-pay

## 2017-12-02 ENCOUNTER — Encounter: Payer: Self-pay | Admitting: Family Medicine

## 2017-12-02 ENCOUNTER — Other Ambulatory Visit: Payer: Self-pay | Admitting: Family Medicine

## 2017-12-02 DIAGNOSIS — R5381 Other malaise: Secondary | ICD-10-CM

## 2017-12-03 ENCOUNTER — Other Ambulatory Visit: Payer: Self-pay | Admitting: Family Medicine

## 2017-12-03 DIAGNOSIS — Z0189 Encounter for other specified special examinations: Secondary | ICD-10-CM

## 2017-12-03 DIAGNOSIS — N63 Unspecified lump in unspecified breast: Secondary | ICD-10-CM

## 2017-12-05 ENCOUNTER — Other Ambulatory Visit: Payer: Self-pay | Admitting: Family Medicine

## 2017-12-05 ENCOUNTER — Telehealth: Payer: Self-pay | Admitting: Family Medicine

## 2017-12-05 DIAGNOSIS — N63 Unspecified lump in unspecified breast: Secondary | ICD-10-CM

## 2017-12-05 NOTE — Telephone Encounter (Signed)
Copied from [place]. Topic: Quick Communication - See Telephone Encounter >> Dec 05, 2017  3:30 PM Boyd Kerbs wrote: CRM for notification. See Telephone encounter for:   Jon Gills from [facility] [phone] Option 1 ,  Jon Gills said can not do diagnostic when it says patient request.. Ins. Will not pay when it says patient request. She is redoing the order and sending back for signature  12/05/17.

## 2017-12-06 NOTE — Telephone Encounter (Signed)
Order will be signed and sent when complete.

## 2017-12-11 DIAGNOSIS — R69 Illness, unspecified: Secondary | ICD-10-CM | POA: Diagnosis not present

## 2017-12-12 ENCOUNTER — Ambulatory Visit
Admission: RE | Admit: 2017-12-12 | Discharge: 2017-12-12 | Disposition: A | Discharge status: Home / Self Care | Payer: Medicare HMO | Source: Ambulatory Visit | Attending: Family Medicine | Admitting: Family Medicine

## 2017-12-12 ENCOUNTER — Other Ambulatory Visit: Payer: Self-pay | Admitting: Family Medicine

## 2017-12-12 ENCOUNTER — Ambulatory Visit
Admission: RE | Admit: 2017-12-12 | Discharge: 2017-12-12 | Disposition: A | Discharge status: Home / Self Care | Payer: PPO | Source: Ambulatory Visit | Attending: Family Medicine | Admitting: Family Medicine

## 2017-12-12 DIAGNOSIS — N63 Unspecified lump in unspecified breast: Secondary | ICD-10-CM

## 2017-12-12 DIAGNOSIS — N6323 Unspecified lump in the left breast, lower outer quadrant: Secondary | ICD-10-CM | POA: Diagnosis not present

## 2017-12-12 DIAGNOSIS — N6321 Unspecified lump in the left breast, upper outer quadrant: Secondary | ICD-10-CM | POA: Diagnosis not present

## 2017-12-12 DIAGNOSIS — N6324 Unspecified lump in the left breast, lower inner quadrant: Secondary | ICD-10-CM | POA: Diagnosis not present

## 2017-12-12 DIAGNOSIS — R921 Mammographic calcification found on diagnostic imaging of breast: Secondary | ICD-10-CM

## 2017-12-12 DIAGNOSIS — R928 Other abnormal and inconclusive findings on diagnostic imaging of breast: Secondary | ICD-10-CM | POA: Diagnosis not present

## 2017-12-12 DIAGNOSIS — N6322 Unspecified lump in the left breast, upper inner quadrant: Secondary | ICD-10-CM | POA: Diagnosis not present

## 2017-12-13 ENCOUNTER — Encounter: Payer: Self-pay | Admitting: Acute Care

## 2017-12-13 NOTE — Telephone Encounter (Signed)
SG please advise. Thanks   

## 2017-12-18 ENCOUNTER — Encounter: Payer: Self-pay | Admitting: Endocrinology

## 2017-12-18 ENCOUNTER — Ambulatory Visit (INDEPENDENT_AMBULATORY_CARE_PROVIDER_SITE_OTHER): Payer: Medicare HMO | Admitting: Endocrinology

## 2017-12-18 VITALS — BP 124/70 | HR 82 | Wt 218.8 lb

## 2017-12-18 DIAGNOSIS — E042 Nontoxic multinodular goiter: Secondary | ICD-10-CM | POA: Diagnosis not present

## 2017-12-18 NOTE — Progress Notes (Signed)
Subjective:    Patient ID: Desiree Snow, female    DOB: 1951/02/06, 67 y.o.   MRN: [id number]  HPI Pt returns for f/u of multinodular goiter: dx'ed 2018, incidentally on a CT scan of the chest  in 2018; [place]: Nodule #4 in the inferior left thyroid lobe and Nodule #5 in the mid left thyroid lobe both meet criteria for biopsy.  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 Pt says she can notice the nodules, but is unaware of any change in size.   Past Medical History:  Diagnosis Date  . Allergy   . Chicken pox   . Diverticulitis   . GERD (gastroesophageal reflux disease)   . Seizures ([place])    resolved    Past Surgical History:  Procedure Laterality Date  . ADENOIDECTOMY  1963  . BREAST EXCISIONAL BIOPSY Left   . BREAST EXCISIONAL BIOPSY Right   . BREAST LUMPECTOMY Left 2009   benign  . LOBECTOMY FOR SEIZURE FOCUS    . SPLENECTOMY  1961  . TONSILLECTOMY  1963  . WISDOM TOOTH EXTRACTION      Social History   Socioeconomic History  . Marital status: Single    Spouse name: Not on file  . Number of children: Not on file  . Years of education: Not on file  . Highest education level: Not on file  Social Needs  . Financial resource strain: Not on file  . Food insecurity - worry: Not on file  . Food insecurity - inability: Not on file  . Transportation needs - medical: Not on file  . Transportation needs - non-medical: Not on file  Occupational History  . Not on file  Tobacco Use  . Smoking status: Former Smoker    Packs/day: 2.00    Years: 29.00    Pack years: 58.00    Types: Cigarettes    Last attempt to quit: 10/08/2002    Years since quitting: 15.2  . Smokeless tobacco: Never Used  Substance and Sexual Activity  . Alcohol use: No    Frequency: Never  . Drug use: No  . Sexual activity: Not on file  Other Topics Concern  . Not on file  Social History Narrative  . Not  on file    Current Outpatient Medications on File Prior to Visit  Medication Sig Dispense Refill  . albuterol (PROVENTIL HFA;VENTOLIN HFA) 108 (90 Base) MCG/ACT inhaler Inhale 2 puffs every 6 (six) hours as needed into the lungs for wheezing or shortness of breath. 1 Inhaler 0  . Biotin 1000 MCG tablet Take 1,000 mcg by mouth daily.    . Calcium Carbonate-Vit D-Min (CALCIUM 1200 PO) Take 1 tablet by mouth daily.    . Cholecalciferol (VITAMIN D3) 1000 units CAPS Take 1 capsule by mouth daily.    Marland Kitchen CINNAMON PO Take 1 tablet by mouth daily.    . clorazepate (TRANXENE) 3.75 MG tablet Take 1 tablet daily as needed by mouth.    . famotidine (PEPCID) 20 MG tablet Take 20 mg by mouth 2 (two) times daily.    Marland Kitchen levETIRAcetam (KEPPRA) 500 MG tablet Take 500 mg by mouth 2 (two) times daily.    . Magnesium 250 MG TABS Take 1 tablet by mouth daily.    . Multiple Vitamin (MULTIVITAMIN) tablet Take 1 tablet by mouth daily.    . TURMERIC PO Take 1 tablet by mouth daily.    Marland Kitchen  Zinc 50 MG CAPS Take 1 capsule by mouth daily.     No current facility-administered medications on file prior to visit.     Allergies  Allergen Reactions  . Quinolones   . Scopolamine Hives     on her fingers no itchng  . Penicillins Rash    Family History  Problem Relation Age of Onset  . Pneumonia Mother   . Dementia Father   . Breast cancer Maternal Aunt   . Goiter Neg Hx   . Thyroid cancer Neg Hx   . Thyroid nodules Neg Hx   . Thyroid disease Neg Hx     BP 124/70 (BP Location: Left Arm, Patient Position: Sitting, Cuff Size: Normal)   Pulse 82   Wt 218 lb 12.8 oz (99.2 kg)   SpO2 95%   BMI 35.32 kg/m   Review of Systems Denies sob.      Objective:   Physical Exam VITAL SIGNS:  See vs page GENERAL: no distress NECK: I can feel 2 thyroid nodules, 1 in each lobe.      Assessment & Plan:  multinodular goiter, due for recheck.   Patient Instructions   when you see Dr [place] soon, she may do blood tests  to include the thyroid.  If not, please let us know, and we would be happy to do.   Let's recheck the ultrasound.  you will receive a phone call, about a day and time for an appointment.  Please come back for a follow-up appointment in 18 months.

## 2017-12-18 NOTE — Patient Instructions (Addendum)
  when you see Dr Martinique soon, she may do blood tests to include the thyroid.  If not, please let us know, and we would be happy to do.   Let's recheck the ultrasound.  you will receive a phone call, about a day and time for an appointment.  Please come back for a follow-up appointment in 18 months.

## 2017-12-22 ENCOUNTER — Encounter: Payer: Self-pay | Admitting: Family Medicine

## 2017-12-24 NOTE — Progress Notes (Signed)
HPI:   [name] is a 67 y.o. female, who is here today for her routine physical.  Last CPE: 2017  Regular exercise 3 or more time per week: Not consistently. Following a healthy diet: Yes. She lives alone.  Chronic medical problems: Prediabetes (she had a HgA1C 6.5 in 11/2016), thyroid nodule,former smoker, allergies, diverticulosis,seizure disorder, microscopic hematuria, and osteopenia among some. In the past she has had concerns about abnormal renal function test.    She follows with Dr Loanne Drilling for thyroid nodule Seizure disorder, she follows with Dr. Manuella Ghazi, she takes Keppra. 500 mg bid. She follows with dermatologist annually for "skin checks."  Immunization History  Administered Date(s) Administered  . Influenza-Unspecified 07/21/2015, 06/28/2016, 05/31/2017  . Pneumococcal Conjugate-13 11/24/2015  . Pneumococcal Polysaccharide-23 10/08/1996, 12/02/2016  . Tdap 04/12/2017  . Zoster 12/13/2014    Mammogram: 12/2017 Bi-rads 2 Colonoscopy: 02/2017 FIT negative (per records review). She reports having colonoscopy 04/2017 and 10 years f/u was recommended. She tells me that she does not trust colonoscopy report because it was done "too fast", she feels like her GI just care about finding polyps so "he gets pay."  DEXA: 06/2017 osteopenia. She takes Ca++ and vit D.  FRAX score for major osteoporotic fracture 7.9% and hip fracture 0.7%  Hep C screening: Reported as done and NR.  Concerns today:  A few days ago she sent a message through my chart with concerns about low dose chest CT report she had in 02/2017. She states that she was expecting this to be fixed by the time she has this appt.   Low dose chest CT showed mild biapical pleural-parenchymal scarring.   She states that when she went back to reviewed report on My chart she noted that report was changed.   She states that as she recalls initial report describes a pulmonary nodule and now this was changed  for parenchymal scarring changes. She tells me that she thinks this report is not hers.  According to pt, she tried to contact radiologist and "management" to express her concerns but report has not been changed.   She denies fever,chills,cough,wheezing,dysonea,or abnormal wt loss.   She is also upset about me referring her to endocrinologist to have thyroid Bx done. It is her opinion thyroid procedure was done "to make money." Also complaining about the fact another thyroid US was ordered and has been arranged sooner than recommended.    Review of Systems  Constitutional: Negative for appetite change, fatigue and fever.  HENT: Negative for dental problem, hearing loss, mouth sores, trouble swallowing and voice change.   Eyes: Negative for redness and visual disturbance.  Respiratory: Negative for cough, shortness of breath and wheezing.   Cardiovascular: Negative for chest pain, palpitations and leg swelling.  Gastrointestinal: Negative for abdominal pain, nausea and vomiting.       No changes in bowel habits.  Endocrine: Negative for cold intolerance, heat intolerance, polydipsia, polyphagia and polyuria.  Genitourinary: Negative for decreased urine volume, dysuria, hematuria, vaginal bleeding and vaginal discharge.  Musculoskeletal: Negative for gait problem and myalgias.  Skin: Negative for color change and rash.  Allergic/Immunologic: Positive for environmental allergies.  Neurological: Negative for seizures, syncope, weakness and headaches.  Hematological: Negative for adenopathy. Does not bruise/bleed easily.  Psychiatric/Behavioral: Negative for confusion and sleep disturbance. The patient is nervous/anxious.   All other systems reviewed and are negative.     Current Outpatient Medications on File Prior to Visit  Medication Sig Dispense Refill  .  Biotin 1000 MCG tablet Take 1,000 mcg by mouth daily.    . Calcium Carbonate-Vit D-Min (CALCIUM 1200 PO) Take 1 tablet by mouth  daily.    . cephALEXin (KEFLEX) 500 MG capsule     . Cholecalciferol (VITAMIN D3) 1000 units CAPS Take 1 capsule by mouth daily.    Marland Kitchen CINNAMON PO Take 1 tablet by mouth daily.    . clorazepate (TRANXENE) 3.75 MG tablet Take 1 tablet daily as needed by mouth.    . famotidine (PEPCID) 20 MG tablet Take 20 mg by mouth 2 (two) times daily.    Marland Kitchen GARCINIA CAMBOGIA-CHROMIUM PO Take by mouth daily.     Marland Kitchen levETIRAcetam (KEPPRA) 500 MG tablet Take 500 mg by mouth 2 (two) times daily.    . Magnesium 250 MG TABS Take 1 tablet by mouth daily.    . Multiple Vitamin (MULTIVITAMIN) tablet Take 1 tablet by mouth daily.    . TURMERIC PO Take 1 tablet by mouth daily.    . Zinc 50 MG CAPS Take 1 capsule by mouth daily.     No current facility-administered medications on file prior to visit.      Past Medical History:  Diagnosis Date  . Allergy   . Chicken pox   . Diverticulitis   . GERD (gastroesophageal reflux disease)   . Seizures ([place])    resolved    Past Surgical History:  Procedure Laterality Date  . ADENOIDECTOMY  1963  . BREAST EXCISIONAL BIOPSY Left   . BREAST EXCISIONAL BIOPSY Right   . BREAST LUMPECTOMY Left 2009   benign  . LOBECTOMY FOR SEIZURE FOCUS    . SPLENECTOMY  1961  . TONSILLECTOMY  1963  . WISDOM TOOTH EXTRACTION      Allergies  Allergen Reactions  . Metronidazole Other (See Comments)    Strange feeling and inability to function  . Quinolones   . Scopolamine Hives     on her fingers no itchng  . Penicillins Rash    Family History  Problem Relation Age of Onset  . Pneumonia Mother   . Dementia Father   . Breast cancer Maternal Aunt   . Goiter Neg Hx   . Thyroid cancer Neg Hx   . Thyroid nodules Neg Hx   . Thyroid disease Neg Hx     Social History   Socioeconomic History  . Marital status: Single    Spouse name: Not on file  . Number of children: Not on file  . Years of education: Not on file  . Highest education level: Not on file  Occupational  History  . Not on file  Social Needs  . Financial resource strain: Not on file  . Food insecurity:    Worry: Not on file    Inability: Not on file  . Transportation needs:    Medical: Not on file    Non-medical: Not on file  Tobacco Use  . Smoking status: Former Smoker    Packs/day: 2.00    Years: 29.00    Pack years: 58.00    Types: Cigarettes    Last attempt to quit: 10/08/2002    Years since quitting: 15.2  . Smokeless tobacco: Never Used  Substance and Sexual Activity  . Alcohol use: No    Frequency: Never  . Drug use: No  . Sexual activity: Not on file  Lifestyle  . Physical activity:    Days per week: Not on file    Minutes per session: Not on  file  . Stress: Not on file  Relationships  . Social connections:    Talks on phone: Not on file    Gets together: Not on file    Attends religious service: Not on file    Active member of club or organization: Not on file    Attends meetings of clubs or organizations: Not on file    Relationship status: Not on file  Other Topics Concern  . Not on file  Social History Narrative  . Not on file     Vitals:   12/25/17 1013  BP: 126/78  Pulse: 84  Resp: 12  Temp: 97.8 F (36.6 C)  SpO2: 97%   Body mass index is 34.94 kg/m.   Wt Readings from Last 3 Encounters:  12/25/17 216 lb 8 oz (98.2 kg)  12/18/17 218 lb 12.8 oz (99.2 kg)  08/26/17 212 lb 6 oz (96.3 kg)      Physical Exam  Nursing note and vitals reviewed. Constitutional: She is oriented to person, place, and time. She appears well-developed. No distress.  HENT:  Head: Normocephalic and atraumatic.  Right Ear: Hearing and external ear normal. Tympanic membrane is not erythematous.  Left Ear: Hearing, tympanic membrane, external ear and ear canal normal.  Mouth/Throat: Uvula is midline, oropharynx is clear and moist and mucous membranes are normal.  Right ear canal cerumen excess, TM seen partially.  Eyes: Conjunctivae and EOM are normal. Pupils are  equal, round, and reactive to light.  Neck: No tracheal deviation present. No thyromegaly present.  Cardiovascular: Normal rate and regular rhythm.  No murmur heard. Pulses:      Dorsalis pedis pulses are 2+ on the right side, and 2+ on the left side.  Respiratory: Effort normal and breath sounds normal. No respiratory distress.  GI: Soft. She exhibits no mass. There is no hepatomegaly. There is no tenderness.  Genitourinary:  Genitourinary Comments: Breast: No masses,skin abnormalities,or nipple discharge bilateral.  Musculoskeletal: She exhibits no edema or tenderness.  No major deformity or signs of synovitis appreciated.  Lymphadenopathy:       Left cervical: No superficial cervical, no deep cervical and no posterior cervical adenopathy present.    She has no axillary adenopathy.       Right: No supraclavicular adenopathy present.       Left: No supraclavicular adenopathy present.  About 2 cm right posterior cervical mass,mobile,and no tender.  Neurological: She is alert and oriented to person, place, and time. She has normal strength. No cranial nerve deficit. Coordination and gait normal.  Reflex Scores:      Bicep reflexes are 2+ on the right side and 2+ on the left side.      Patellar reflexes are 2+ on the right side and 2+ on the left side. Skin: Skin is warm. No rash noted. No erythema.  Psychiatric: Her mood appears anxious. Her affect is blunt.  Fairly groomed, no good eye contact.      ASSESSMENT AND PLAN:  Ms. Hope Brandenburger was here today annual physical examination.   Orders Placed This Encounter  Procedures  . CT Chest Wo Contrast  . DG Bone Density  . Microalbumin / creatinine urine ratio  . Basic metabolic panel  . TSH  . Lipid panel  . Hemoglobin A1c    Lab Results  Component Value Date   TSH 1.27 12/25/2017   Lab Results  Component Value Date   HGBA1C 6.4 12/25/2017   Lab Results  Component Value Date  CHOL 187 12/25/2017   HDL 50.90  12/25/2017   LDLCALC 104 (H) 12/25/2017   TRIG 158.0 (H) 12/25/2017   CHOLHDL 4 12/25/2017     Routine general medical examination at a health care facility  We discussed the importance of regular physical activity and healthy diet for prevention of chronic illness and/or complications. Preventive guidelines reviewed. Vaccination up to date. She is interested in [place], Rx given.  Ca++ and vit D supplementation to continue. Next CPE in a year.  The 10-year ASCVD risk score Mikey Bussing DC Brooke Bonito., et al., 2013) is: 6.7%   Values used to calculate the score:     Age: 89 years     Sex: Female     Is Non-Hispanic African American: No     Diabetic: No     Tobacco smoker: No     Systolic Blood Pressure: 607 mmHg     Is BP treated: No     HDL Cholesterol: 50.9 mg/dL     Total Cholesterol: 187 mg/dL  Multinodular goiter  I recommend continuing follow ups with endocrinologist.  -     TSH  IFG (impaired fasting glucose)  Continue primary prevention with a healthy life style. Further recommendations will be given according to lab results.   -     Basic metabolic panel -     Hemoglobin A1c  Lung nodule < 6cm on CT  Further recommendations will be given according to imaging results.  -     CT Chest Wo Contrast; Future  Abnormal renal function test -     Microalbumin / creatinine urine ratio  Screening for lipid disorders -     Lipid panel  Asymptomatic postmenopausal estrogen deficiency -     DG Bone Density; Future  Other orders -     Zoster Vaccine Adjuvanted [facility]) injection; 0.5 ml in muscle and repeat in 8 weeks    10:26 Am to 11:32 Am face to face.  Ms Danek was confrontational and dismissive. She did not treat me well and frequently throughout her visit she made comments diminishing my role as her PCP. She repeated phrases like "I guess I was expecting too much","we are not on the same page", etc.  When I saw her on 04/12/17 I noted right posterior  cervical mass, so a neck CT was done on 04/23/17, no suspicious lymph nodules identified. Cervical lesion seems smaller today and she is not reporting changes in size. Other findings: Thyroid: Subcentimeter thyroid nodules bilaterally, not large enough to warrant routine ultrasound evaluation. She was concerned about findings,so Thyroid US was arranged.  Upper chest: 3 mm right upper lobe nodule likely unchanged from prior CT.  She was concerned about the fact nodule was not reported in low dose chest CT and contacted pulmonologist's office.  She met with Ms Elie Confer ,[place] to discuss finding on neck CT and low density chest CT on 05/15/17.  I tried to show her all the communications in chronological order since the time she had neck CT but she was not interested in listening to me.  She does not want to go back to pulmonologist here in [place]. I offered to arrange imaging or visit with pulmonologist  [facility] or [facility] but she replies that she will never go there.  Finally she agrees with arranging chest CT w/o contrast , so this can be compared with prior studies.   She does not feel like she is receiving the care she is expecting and I did not  feel like I was treated with respect, so I recommended establishing with a different provider.  She is in agreement with establishing with a new PCP.       Elzora Cullins G. Martinique, MD  [facility]. [place] office.

## 2017-12-25 ENCOUNTER — Encounter: Payer: Self-pay | Admitting: Family Medicine

## 2017-12-25 ENCOUNTER — Telehealth: Payer: Self-pay | Admitting: Endocrinology

## 2017-12-25 ENCOUNTER — Ambulatory Visit (INDEPENDENT_AMBULATORY_CARE_PROVIDER_SITE_OTHER): Payer: Medicare HMO | Admitting: Family Medicine

## 2017-12-25 VITALS — BP 126/78 | HR 84 | Temp 97.8°F | Resp 12 | Ht 66.0 in | Wt 216.5 lb

## 2017-12-25 DIAGNOSIS — E042 Nontoxic multinodular goiter: Secondary | ICD-10-CM | POA: Diagnosis not present

## 2017-12-25 DIAGNOSIS — Z780 Asymptomatic menopausal state: Secondary | ICD-10-CM | POA: Diagnosis not present

## 2017-12-25 DIAGNOSIS — R911 Solitary pulmonary nodule: Secondary | ICD-10-CM | POA: Diagnosis not present

## 2017-12-25 DIAGNOSIS — Z0001 Encounter for general adult medical examination with abnormal findings: Secondary | ICD-10-CM | POA: Diagnosis not present

## 2017-12-25 DIAGNOSIS — IMO0001 Reserved for inherently not codable concepts without codable children: Secondary | ICD-10-CM

## 2017-12-25 DIAGNOSIS — Z13220 Encounter for screening for lipoid disorders: Secondary | ICD-10-CM | POA: Diagnosis not present

## 2017-12-25 DIAGNOSIS — R944 Abnormal results of kidney function studies: Secondary | ICD-10-CM

## 2017-12-25 DIAGNOSIS — R7301 Impaired fasting glucose: Secondary | ICD-10-CM | POA: Diagnosis not present

## 2017-12-25 DIAGNOSIS — Z0000 Encounter for general adult medical examination without abnormal findings: Secondary | ICD-10-CM

## 2017-12-25 LAB — LIPID PANEL
CHOL/HDL RATIO: 4
CHOLESTEROL: 187 mg/dL (ref 0–200)
HDL: 50.9 mg/dL (ref >39.00)
LDL CALC: 104 mg/dL — AB (ref 0–99)
NonHDL: 136
TRIGLYCERIDES: 158 mg/dL — AB (ref 0.0–149.0)
VLDL: 31.6 mg/dL (ref 0.0–40.0)

## 2017-12-25 LAB — BASIC METABOLIC PANEL
BUN: 18 mg/dL (ref 6–23)
CALCIUM: 9.9 mg/dL (ref 8.4–10.5)
CO2: 24 meq/L (ref 19–32)
Chloride: 105 mEq/L (ref 96–112)
Creatinine, Ser: 0.95 mg/dL (ref 0.40–1.20)
GFR: 62.23 mL/min (ref >60.00)
GLUCOSE: 117 mg/dL — AB (ref 70–99)
POTASSIUM: 4.7 meq/L (ref 3.5–5.1)
SODIUM: 141 meq/L (ref 135–145)

## 2017-12-25 LAB — HEMOGLOBIN A1C: Hgb A1c MFr Bld: 6.4 % (ref 4.6–6.5)

## 2017-12-25 LAB — TSH: TSH: 1.27 u[IU]/mL (ref 0.35–4.50)

## 2017-12-25 LAB — MICROALBUMIN / CREATININE URINE RATIO
Creatinine,U: 164.3 mg/dL
MICROALB/CREAT RATIO: 0.4 mg/g (ref 0.0–30.0)
Microalb, Ur: 0.7 mg/dL (ref 0.0–1.9)

## 2017-12-25 MED ORDER — ZOSTER VAC RECOMB ADJUVANTED 50 MCG/0.5ML IM SUSR: INTRAMUSCULAR | 1 refills | Status: DC

## 2017-12-25 NOTE — Patient Instructions (Addendum)
A few things to remember from today's visit:   Routine general medical examination at a health care facility  Multinodular goiter - Plan: TSH  IFG (impaired fasting glucose) - Plan: Basic metabolic panel, Hemoglobin A1c  Abnormal findings on diagnostic imaging of lung  Lung nodule < 6cm on CT - Plan: CT Chest Wo Contrast  Abnormal renal function test - Plan: Microalbumin / creatinine urine ratio  Screening for lipid disorders - Plan: Lipid panel  Asymptomatic postmenopausal estrogen deficiency - Plan: DG Bone Density   A few tips:  -As we age balance is not as good as it was, so there is a higher risks for falls. Please remove small rugs and furniture that is "in your way" and could increase the risk of falls. Stretching exercises may help with fall prevention: Yoga and Tai Chi are some examples. Low impact exercise is better, so you are not very achy the next day.  -Sun screen and avoidance of direct sun light recommended. Caution with dehydration, if working outdoors be sure to drink enough fluids.  - Some medications are not safe as we age, increases the risk of side effects and can potentially interact with other medication you are also taken;  including some of over the counter medications. Be sure to let me know when you start a new medication even if it is a dietary/vitamin supplement.   -Healthy diet low in red meet/animal fat and sugar + regular physical activity is recommended.       Screening schedule for the next 5-10 years:  Colonoscopy every 5-10 years depending of pathology report.  Glaucoma screening/eye exam every 1-2 years.  Mammogram for breast cancer screening annually.  Flu vaccine annually.  Diabetes and cholesterol screening   Fall prevention   Advance directives:  Please see a lawyer and/or go to this website to help you with advanced directives and designating a health care power of attorney so that your wishes will be followed should you  become too ill to make your own medical decisions.  [email or web address]       Please be sure medication list is accurate. If a new problem present, please set up appointment sooner than planned today.

## 2017-12-25 NOTE — Telephone Encounter (Signed)
Patient states Dr did not mention anything about a ultrasound in her last visit. Patient believes that this was not suppose to be so soon that she just recently had one in [place].  We read the after summary visit to patient as to what doctor had stated. That it sounded like he was wanting to get another one done.  Patient stated she did not understand why.   Please advise

## 2017-12-25 NOTE — Telephone Encounter (Signed)
I contacted the patient and she stated the ultrasound was suppose to be in September per the discussion in the office visit. Patient stated this was discussed because it would be a year since the last one was completed. I called [facility] and scheduled the [place] for 07/02/2018 at 100 pm. Patient had no further questions at this time.

## 2017-12-25 NOTE — Telephone Encounter (Signed)
Concerns addressed at appointment on 12/25/17.

## 2017-12-25 NOTE — Telephone Encounter (Signed)
The ultrasound is to follow up the one you had last year.  However, if you want to wait, this is fine.

## 2017-12-25 NOTE — Telephone Encounter (Signed)
See message and please advise, Thanks!  

## 2017-12-28 ENCOUNTER — Encounter: Payer: Self-pay | Admitting: Family Medicine

## 2018-01-02 ENCOUNTER — Encounter: Payer: Self-pay | Admitting: Family Medicine

## 2018-01-02 ENCOUNTER — Ambulatory Visit (INDEPENDENT_AMBULATORY_CARE_PROVIDER_SITE_OTHER)
Admission: RE | Admit: 2018-01-02 | Discharge: 2018-01-02 | Disposition: A | Discharge status: Home / Self Care | Payer: Medicare HMO | Source: Ambulatory Visit | Attending: Family Medicine | Admitting: Family Medicine

## 2018-01-02 DIAGNOSIS — R911 Solitary pulmonary nodule: Secondary | ICD-10-CM | POA: Diagnosis not present

## 2018-01-02 DIAGNOSIS — IMO0001 Reserved for inherently not codable concepts without codable children: Secondary | ICD-10-CM

## 2018-01-03 ENCOUNTER — Encounter: Payer: Self-pay | Admitting: Family Medicine

## 2018-01-03 ENCOUNTER — Telehealth: Payer: Self-pay | Admitting: Family Medicine

## 2018-01-03 NOTE — Telephone Encounter (Signed)
Pt would like to have call about test results please call [phone]

## 2018-01-03 NOTE — Telephone Encounter (Signed)
Copied from [place] [id number]. Topic: Quick Communication - Other Results >> Jan 03, 2018 10:15 AM Antonieta Iba C wrote: Pt would like to request her imaging results.

## 2018-01-05 ENCOUNTER — Encounter: Payer: Self-pay | Admitting: Family Medicine

## 2018-01-06 NOTE — Telephone Encounter (Signed)
Spoke with patient through my chart, informed her that once results are seen and recommendations are given that I would be calling her with results.

## 2018-01-07 ENCOUNTER — Encounter: Payer: Self-pay | Admitting: Family Medicine

## 2018-01-08 ENCOUNTER — Other Ambulatory Visit: Payer: Medicare HMO

## 2018-01-31 ENCOUNTER — Ambulatory Visit
Admission: RE | Admit: 2018-01-31 | Discharge: 2018-01-31 | Disposition: A | Discharge status: Home / Self Care | Payer: Medicare HMO | Source: Ambulatory Visit | Attending: Family Medicine | Admitting: Family Medicine

## 2018-01-31 DIAGNOSIS — M85851 Other specified disorders of bone density and structure, right thigh: Secondary | ICD-10-CM | POA: Diagnosis not present

## 2018-01-31 DIAGNOSIS — Z780 Asymptomatic menopausal state: Secondary | ICD-10-CM

## 2018-02-09 ENCOUNTER — Encounter: Payer: Self-pay | Admitting: Family Medicine

## 2018-03-04 ENCOUNTER — Other Ambulatory Visit: Payer: Self-pay | Admitting: Acute Care

## 2018-03-04 DIAGNOSIS — Z87891 Personal history of nicotine dependence: Secondary | ICD-10-CM

## 2018-03-04 DIAGNOSIS — Z122 Encounter for screening for malignant neoplasm of respiratory organs: Secondary | ICD-10-CM

## 2018-03-06 ENCOUNTER — Other Ambulatory Visit: Payer: Self-pay | Admitting: Family Medicine

## 2018-03-28 DIAGNOSIS — H1011 Acute atopic conjunctivitis, right eye: Secondary | ICD-10-CM | POA: Diagnosis not present

## 2018-04-24 DIAGNOSIS — H524 Presbyopia: Secondary | ICD-10-CM | POA: Diagnosis not present

## 2018-07-02 ENCOUNTER — Ambulatory Visit
Admission: RE | Admit: 2018-07-02 | Discharge: 2018-07-02 | Disposition: A | Discharge status: Home / Self Care | Payer: Medicare HMO | Source: Ambulatory Visit | Attending: Endocrinology | Admitting: Endocrinology

## 2018-07-02 ENCOUNTER — Telehealth: Payer: Self-pay | Admitting: Acute Care

## 2018-07-02 DIAGNOSIS — E042 Nontoxic multinodular goiter: Secondary | ICD-10-CM

## 2018-07-02 NOTE — Telephone Encounter (Signed)
I have called the patient and advised her that Langley Gauss the nurse that works with the low dose screening program and she will be in on Friday. An will give her a call back asap on Friday.

## 2018-07-04 NOTE — Telephone Encounter (Signed)
Spoke with pt and advised that due to pt quitting smoking greater than 15 years ago she will no longer qualify for the lung cancer screening program.  Pt advised that Chest CT without contrast was done by Dr Martinique on 01/02/18 and that she could discuss further Ct follow ups with the new PCP that she will be seeing.  Pt verbalized understanding.  Nothing further needed.

## 2018-07-08 DIAGNOSIS — E041 Nontoxic single thyroid nodule: Secondary | ICD-10-CM | POA: Diagnosis not present

## 2018-07-08 DIAGNOSIS — K219 Gastro-esophageal reflux disease without esophagitis: Secondary | ICD-10-CM | POA: Diagnosis not present

## 2018-07-08 DIAGNOSIS — E781 Pure hyperglyceridemia: Secondary | ICD-10-CM | POA: Diagnosis not present

## 2018-07-08 DIAGNOSIS — R918 Other nonspecific abnormal finding of lung field: Secondary | ICD-10-CM | POA: Diagnosis not present

## 2018-07-08 DIAGNOSIS — R7301 Impaired fasting glucose: Secondary | ICD-10-CM | POA: Diagnosis not present

## 2018-07-08 DIAGNOSIS — M859 Disorder of bone density and structure, unspecified: Secondary | ICD-10-CM | POA: Diagnosis not present

## 2018-07-08 DIAGNOSIS — Z6833 Body mass index (BMI) 33.0-33.9, adult: Secondary | ICD-10-CM | POA: Diagnosis not present

## 2018-07-08 DIAGNOSIS — Z23 Encounter for immunization: Secondary | ICD-10-CM | POA: Diagnosis not present

## 2018-07-08 DIAGNOSIS — E668 Other obesity: Secondary | ICD-10-CM | POA: Diagnosis not present

## 2018-08-11 DIAGNOSIS — R69 Illness, unspecified: Secondary | ICD-10-CM | POA: Diagnosis not present

## 2018-09-12 DIAGNOSIS — G40909 Epilepsy, unspecified, not intractable, without status epilepticus: Secondary | ICD-10-CM | POA: Diagnosis not present

## 2018-10-15 DIAGNOSIS — L720 Epidermal cyst: Secondary | ICD-10-CM | POA: Diagnosis not present

## 2018-10-15 DIAGNOSIS — D1801 Hemangioma of skin and subcutaneous tissue: Secondary | ICD-10-CM | POA: Diagnosis not present

## 2018-10-15 DIAGNOSIS — L03011 Cellulitis of right finger: Secondary | ICD-10-CM | POA: Diagnosis not present

## 2018-10-15 DIAGNOSIS — D2262 Melanocytic nevi of left upper limb, including shoulder: Secondary | ICD-10-CM | POA: Diagnosis not present

## 2018-10-15 DIAGNOSIS — D225 Melanocytic nevi of trunk: Secondary | ICD-10-CM | POA: Diagnosis not present

## 2018-10-15 DIAGNOSIS — L7211 Pilar cyst: Secondary | ICD-10-CM | POA: Diagnosis not present

## 2018-10-15 DIAGNOSIS — L821 Other seborrheic keratosis: Secondary | ICD-10-CM | POA: Diagnosis not present

## 2018-10-21 DIAGNOSIS — R69 Illness, unspecified: Secondary | ICD-10-CM | POA: Diagnosis not present

## 2018-11-06 DIAGNOSIS — R69 Illness, unspecified: Secondary | ICD-10-CM | POA: Diagnosis not present

## 2018-12-09 ENCOUNTER — Other Ambulatory Visit: Payer: Self-pay | Admitting: Family Medicine

## 2018-12-09 DIAGNOSIS — Z1231 Encounter for screening mammogram for malignant neoplasm of breast: Secondary | ICD-10-CM

## 2018-12-10 ENCOUNTER — Telehealth: Payer: Self-pay | Admitting: Acute Care

## 2018-12-10 NOTE — Telephone Encounter (Signed)
Called and spoke with patient she stated that she was returning a call for the lung cancer screening. Will route to [place].

## 2018-12-11 NOTE — Telephone Encounter (Signed)
Desiree Snow, this pt is returning your call.  See telephone note 07/02/18 where I advised pt that she no longer qualifies for lung cancer screening and to f/u with Dr Martinique.

## 2018-12-11 NOTE — Telephone Encounter (Signed)
Desiree Snow please advise thank you.

## 2018-12-15 NOTE — Telephone Encounter (Signed)
I have spoken with Desiree Snow and explained that she has aged out of the [place] because she has been quit smoking for more then 15 years. I explained that we would fax letter to PCP now is Dr. Osborne Casco letting him know to keep check on nodule seen on CT scan. Letter has been faxed to Dr. Osborne Casco and the patient is scheduled to see him the last of  March, 2020

## 2018-12-17 ENCOUNTER — Ambulatory Visit
Admission: RE | Admit: 2018-12-17 | Discharge: 2018-12-17 | Disposition: A | Discharge status: Home / Self Care | Payer: Medicare HMO | Source: Ambulatory Visit

## 2018-12-17 ENCOUNTER — Other Ambulatory Visit: Payer: Self-pay

## 2018-12-17 ENCOUNTER — Other Ambulatory Visit: Payer: Self-pay | Admitting: Internal Medicine

## 2018-12-17 DIAGNOSIS — Z1231 Encounter for screening mammogram for malignant neoplasm of breast: Secondary | ICD-10-CM | POA: Diagnosis not present

## 2018-12-25 DIAGNOSIS — M1612 Unilateral primary osteoarthritis, left hip: Secondary | ICD-10-CM | POA: Diagnosis not present

## 2018-12-25 DIAGNOSIS — M1611 Unilateral primary osteoarthritis, right hip: Secondary | ICD-10-CM | POA: Diagnosis not present

## 2019-01-01 DIAGNOSIS — E668 Other obesity: Secondary | ICD-10-CM | POA: Diagnosis not present

## 2019-01-01 DIAGNOSIS — Z0000 Encounter for general adult medical examination without abnormal findings: Secondary | ICD-10-CM | POA: Diagnosis not present

## 2019-01-01 DIAGNOSIS — I700 Atherosclerosis of aorta: Secondary | ICD-10-CM | POA: Diagnosis not present

## 2019-01-01 DIAGNOSIS — M859 Disorder of bone density and structure, unspecified: Secondary | ICD-10-CM | POA: Diagnosis not present

## 2019-01-01 DIAGNOSIS — J439 Emphysema, unspecified: Secondary | ICD-10-CM | POA: Diagnosis not present

## 2019-01-01 DIAGNOSIS — E041 Nontoxic single thyroid nodule: Secondary | ICD-10-CM | POA: Diagnosis not present

## 2019-01-01 DIAGNOSIS — R7301 Impaired fasting glucose: Secondary | ICD-10-CM | POA: Diagnosis not present

## 2019-01-01 DIAGNOSIS — R82998 Other abnormal findings in urine: Secondary | ICD-10-CM | POA: Diagnosis not present

## 2019-01-01 DIAGNOSIS — K219 Gastro-esophageal reflux disease without esophagitis: Secondary | ICD-10-CM | POA: Diagnosis not present

## 2019-01-01 DIAGNOSIS — E781 Pure hyperglyceridemia: Secondary | ICD-10-CM | POA: Diagnosis not present

## 2019-01-01 DIAGNOSIS — R918 Other nonspecific abnormal finding of lung field: Secondary | ICD-10-CM | POA: Diagnosis not present

## 2019-01-05 ENCOUNTER — Telehealth: Payer: Self-pay | Admitting: Acute Care

## 2019-01-05 NOTE — Telephone Encounter (Signed)
Spoke with pt regarding nodule findings on prior chest CT.  Pt is aware that she no longer qualifies of lung cancer screening due to quitting smoking greater than 15 years ago.  PT would like to have a pulmonary consult to discuss lung nodule f/u .  Pt advised that we are not scheduling routine CT follow ups at this time due to Covid-19 guidelines.  Pt advised that I will discuss with Eric Form, NP and get back with her. Please leave in my box for me to f/u on.

## 2019-01-06 NOTE — Telephone Encounter (Signed)
Per request. Will send to [facility] box.

## 2019-01-07 ENCOUNTER — Telehealth: Payer: Self-pay | Admitting: Endocrinology

## 2019-01-07 NOTE — Telephone Encounter (Signed)
Patient called in regards to wanting to know when she needs to come back to see Dr.Ellison, I stated to patient on the after visit summary is states 18 months. I know that but when do I need to come back. I said 18 months. The patient stated I wasn't listening to her. She then begins to ask when she needs to come back for imaging. I stated I am unable to answer and I will have the nurse call her. The patient stated, yes good, that's the first thing you needed to do stupid.  I stated to patient I will get that done for her and to have a great day and disconnected.

## 2019-01-07 NOTE — Telephone Encounter (Signed)
Spoke to pt and clarified when she needs further imaging and follow up

## 2019-01-08 NOTE — Telephone Encounter (Signed)
Per Eric Form, NP recall placed for pt to have a pulmonary consult with one of our physicians to discuss lung nodule in July .   Nothing further needed at this time.

## 2019-01-27 DIAGNOSIS — M25552 Pain in left hip: Secondary | ICD-10-CM | POA: Diagnosis not present

## 2019-01-27 DIAGNOSIS — M25651 Stiffness of right hip, not elsewhere classified: Secondary | ICD-10-CM | POA: Diagnosis not present

## 2019-01-27 DIAGNOSIS — M6281 Muscle weakness (generalized): Secondary | ICD-10-CM | POA: Diagnosis not present

## 2019-01-27 DIAGNOSIS — M25652 Stiffness of left hip, not elsewhere classified: Secondary | ICD-10-CM | POA: Diagnosis not present

## 2019-01-27 DIAGNOSIS — M25551 Pain in right hip: Secondary | ICD-10-CM | POA: Diagnosis not present

## 2019-01-27 DIAGNOSIS — R262 Difficulty in walking, not elsewhere classified: Secondary | ICD-10-CM | POA: Diagnosis not present

## 2019-01-29 DIAGNOSIS — R262 Difficulty in walking, not elsewhere classified: Secondary | ICD-10-CM | POA: Diagnosis not present

## 2019-01-29 DIAGNOSIS — M6281 Muscle weakness (generalized): Secondary | ICD-10-CM | POA: Diagnosis not present

## 2019-01-29 DIAGNOSIS — M25552 Pain in left hip: Secondary | ICD-10-CM | POA: Diagnosis not present

## 2019-01-29 DIAGNOSIS — M25551 Pain in right hip: Secondary | ICD-10-CM | POA: Diagnosis not present

## 2019-01-29 DIAGNOSIS — M25652 Stiffness of left hip, not elsewhere classified: Secondary | ICD-10-CM | POA: Diagnosis not present

## 2019-01-29 DIAGNOSIS — M25651 Stiffness of right hip, not elsewhere classified: Secondary | ICD-10-CM | POA: Diagnosis not present

## 2019-02-03 DIAGNOSIS — M6281 Muscle weakness (generalized): Secondary | ICD-10-CM | POA: Diagnosis not present

## 2019-02-03 DIAGNOSIS — M25651 Stiffness of right hip, not elsewhere classified: Secondary | ICD-10-CM | POA: Diagnosis not present

## 2019-02-03 DIAGNOSIS — M25652 Stiffness of left hip, not elsewhere classified: Secondary | ICD-10-CM | POA: Diagnosis not present

## 2019-02-03 DIAGNOSIS — R262 Difficulty in walking, not elsewhere classified: Secondary | ICD-10-CM | POA: Diagnosis not present

## 2019-02-03 DIAGNOSIS — M25552 Pain in left hip: Secondary | ICD-10-CM | POA: Diagnosis not present

## 2019-02-03 DIAGNOSIS — M25551 Pain in right hip: Secondary | ICD-10-CM | POA: Diagnosis not present

## 2019-02-04 ENCOUNTER — Encounter: Payer: Self-pay | Admitting: Internal Medicine

## 2019-02-04 ENCOUNTER — Ambulatory Visit: Payer: Medicare HMO | Admitting: Internal Medicine

## 2019-02-04 ENCOUNTER — Other Ambulatory Visit: Payer: Self-pay

## 2019-02-04 VITALS — BP 114/76 | HR 85 | Temp 98.4°F | Ht 66.5 in | Wt 221.4 lb

## 2019-02-04 DIAGNOSIS — J310 Chronic rhinitis: Secondary | ICD-10-CM | POA: Insufficient documentation

## 2019-02-04 DIAGNOSIS — R0609 Other forms of dyspnea: Secondary | ICD-10-CM | POA: Diagnosis not present

## 2019-02-04 DIAGNOSIS — R911 Solitary pulmonary nodule: Secondary | ICD-10-CM | POA: Diagnosis not present

## 2019-02-04 DIAGNOSIS — R0600 Dyspnea, unspecified: Secondary | ICD-10-CM | POA: Insufficient documentation

## 2019-02-04 LAB — CBC WITH DIFFERENTIAL/PLATELET
Basophils Absolute: 0.1 10*3/uL (ref 0.0–0.1)
Basophils Relative: 0.6 % (ref 0.0–3.0)
Eosinophils Absolute: 1.3 10*3/uL — ABNORMAL HIGH (ref 0.0–0.7)
Eosinophils Relative: 10.6 % — ABNORMAL HIGH (ref 0.0–5.0)
HCT: 41.4 % (ref 36.0–46.0)
Hemoglobin: 14 g/dL (ref 12.0–15.0)
Lymphocytes Relative: 35.1 % (ref 12.0–46.0)
Lymphs Abs: 4.4 10*3/uL — ABNORMAL HIGH (ref 0.7–4.0)
MCHC: 33.7 g/dL (ref 30.0–36.0)
MCV: 93.5 fl (ref 78.0–100.0)
Monocytes Absolute: 1.1 10*3/uL — ABNORMAL HIGH (ref 0.1–1.0)
Monocytes Relative: 8.7 % (ref 3.0–12.0)
Neutro Abs: 5.6 10*3/uL (ref 1.4–7.7)
Neutrophils Relative %: 45 % (ref 43.0–77.0)
Platelets: 327 10*3/uL (ref 150.0–400.0)
RBC: 4.43 Mil/uL (ref 3.87–5.11)
RDW: 13.1 % (ref 11.5–15.5)
WBC: 12.4 10*3/uL — ABNORMAL HIGH (ref 4.0–10.5)

## 2019-02-04 NOTE — Patient Instructions (Signed)
Please remember to go to the lab department   for your tests - we will call you with the results when they are available.      Please schedule a follow up office visit in 6 weeks, call sooner if needed with pfts on return

## 2019-02-04 NOTE — Assessment & Plan Note (Signed)
Allergy profile 02/04/2019 >  Eos 1.5  /  IgE     Her eosinophilia is striking and I suspect she would benefit from a formal allergy evaluation but she states that Claritin is all she needs for now.  I would like her to see Dr. Bruna Potter group if this proves not to be adequate.

## 2019-02-04 NOTE — Assessment & Plan Note (Addendum)
Incidental finding of 3 mm non-concerning Pulmonary nodule  on  Contrast CT of neck 04/2017 - CT chest 01/02/18 stable 3 mm nodule/ copd changes    At 3 mm and unchanged at f/u and more c/w subpleural node this meets criteria for a benign lesion and radiology recs no further f/u and I concur.   Low-dose CT lung cancer screening is recommended for patients who are 48-69 years of age with a 30+ pack-year history of smoking, and who are currently smoking or quit <=15 years ago.   Since she is out now > 15 years she no longer meets criteria for lung cancer screening/ advised.  Discussed in detail all the  indications, usual  risks and alternatives  relative to the benefits with patient who agrees to proceed with f/u prn new respiratory symptoms.

## 2019-02-04 NOTE — Assessment & Plan Note (Signed)
Onset 12/2018 better on clariton - 02/04/2019   Walked RA  2 laps @  approx 271ft each @ fast pace  stopped due to  End of study, min sob with sat 100%    Not able to reproduce her complaint of being short of breath going to room to room and it is interesting that she perceives that Claritin has helped her more than anything else suggesting this is related to nasal obstruction and not to a pulmonary limitation.  See separate assessment for chronic rhinitis and possible need for allergy evaluation.  I would like her to return here for full PFTs once the COVID-19 restrictions have been lifted.   .Total time devoted to counseling  > 50 % of initial 60 min office visit:  review case with pt/direct observation of portions of O2 ambulatory saturation study/ Discussion of options/alternatives/ personally creating written customized instructions  in presence of pt  then going over those specific  Instructions directly with the pt including how to use all of the meds but in particular covering each new medication in detail and the difference between the maintenance= "automatic" meds and the prns using an action plan format for the latter (If this problem/symptom => do that organization reading Left to right).  Please see AVS from this visit for a full list of these instructions which I personally wrote for this pt and  are unique to this visit.

## 2019-02-04 NOTE — Progress Notes (Signed)
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      History of Present Illness  02/04/2019  Pulmonary/ 1st office eval/Os[facility] Oettinger  Chief Complaint  Patient presents with   Pulmonary Consult    Self referral for pulmonary nodule. Pt c/o DOE x 4 wks- gets winded walking room to room sometimes- clariton has helped some.   Dyspnea:  Room to room but better since on clariton Cough: none Sleep: on side / bed is flat / one pillow  SABA use: no inhalers   No obvious day to day or daytime variability or assoc excess/ purulent sputum or mucus plugs or hemoptysis or cp or chest tightness, subjective wheeze or overt   hb symptoms.   Sleeping as above without nocturnal  or early am exacerbation  of respiratory  c/o's or need for noct saba. Also denies any obvious fluctuation of symptoms with weather or environmental changes or other aggravating or alleviating factors except as outlined above   No unusual exposure hx or h/o childhood pna/ asthma or knowledge of premature birth.  Current Allergies, Complete Past Medical History, Past Surgical History, Family History, and Social History were reviewed in [employer] record.  ROS  The following are not active complaints unless bolded Hoarseness, sore throat, dysphagia, dental problems, itching, sneezing,  nasal congestion or discharge of excess mucus or purulent secretions, ear ache,   fever, chills, sweats, unintended wt loss or wt gain, classically pleuritic or exertional cp,  orthopnea pnd or arm/hand swelling  or leg swelling, presyncope, palpitations, abdominal pain, anorexia, nausea, vomiting, diarrhea  or change in bowel habits or change in bladder habits, change in stools or  change in urine, dysuria, hematuria,  rash, arthralgias/ R hip pain, visual complaints, headache, numbness, weakness or ataxia or problems with walking or coordination,  change in mood or  memory.           Past Medical History:  Diagnosis Date   Allergy    Chicken pox    Diverticulitis    GERD (gastroesophageal reflux disease)    Seizures ([place])    resolved    Outpatient Medications Prior to Visit  Medication Sig Dispense Refill   Calcium Carbonate-Vit D-Min (CALCIUM 1200 PO) Take 1 tablet by mouth daily.     Cholecalciferol (VITAMIN D3) 1000 units CAPS Take 1 capsule by mouth daily.     CINNAMON PO Take 1 tablet by mouth daily.     clorazepate (TRANXENE) 3.75 MG tablet Take 1 tablet daily as needed by mouth.     famotidine (PEPCID) 20 MG tablet Take 20 mg by mouth daily.      levETIRAcetam (KEPPRA) 500 MG tablet Take 500 mg by mouth 2 (two) times daily.     loratadine (CLARITIN) 10 MG tablet Take 10 mg by mouth daily as needed for allergies.     Magnesium 250 MG TABS Take 1 tablet by mouth daily.     Multiple Vitamin (MULTIVITAMIN) tablet Take 1 tablet by mouth daily.     TURMERIC PO Take 1 tablet by mouth daily.     UNABLE TO FIND Med Name: Collagen 2 otc once daily     Zinc 50 MG CAPS Take 1  capsule by mouth daily.     Biotin 1000 MCG tablet Take 1,000 mcg by mouth daily.     cephALEXin (KEFLEX) 500 MG capsule      GARCINIA CAMBOGIA-CHROMIUM PO Take by mouth daily.      Zoster Vaccine Adjuvanted [facility]) injection 0.5 ml in muscle and repeat in 8 weeks 0.5 mL 1      Objective:     BP 114/76 (BP Location: Left Arm, Cuff Size: Large)    Pulse 85    Temp 98.4 F (36.9 C) (Oral)    Ht 5' 6.5" (1.689 m)    Wt 221 lb 6.4 oz (100.4 kg)    SpO2 98%    BMI 35.20 kg/m   SpO2: 98 % RA  Animated verbose quite pleasant mod obese wf nad   HEENT: nl dentition, turbinates bilaterally, and oropharynx. Nl external ear canals without cough reflex   NECK :   without JVD/Nodes/TM/ nl carotid upstrokes bilaterally   LUNGS: no acc muscle use,  Nl contour chest which is clear to A and P bilaterally without cough on insp or exp maneuvers   CV:  RRR  no s3 or murmur or increase in P2, and no edema   ABD:  soft and nontender with nl inspiratory excursion in the supine position. No bruits or organomegaly appreciated, bowel sounds nl  MS:  Nl gait/ ext warm without deformities, calf tenderness, cyanosis or clubbing No obvious joint restrictions   SKIN: warm and dry without lesions    NEURO:  alert, approp, nl sensorium with  no motor or cerebellar deficits apparent.      I personally reviewed images and agree with radiology impression as follows:   Chest CT 01/02/18 1. Tiny 3 mm subpleural nodule in the right upper lobe, stable compared to prior examinations dating back to 02/15/2017. This is considered benign, likely a subpleural lymph node. No new suspicious appearing pulmonary nodules or masses are noted. Patient should be returned to the lung cancer screening population as previously recommended. 2. Aortic atherosclerosis. 3. Mild diffuse bronchial wall thickening with mild centrilobular and paraseptal emphysema; imaging findings suggestive of underlying COPD.  Labs ordered 02/04/2019  Allergy profile          Assessment   Incidental pulmonary nodule Incidental finding of 3 mm non-concerning Pulmonary nodule  on  Contrast CT of neck 04/2017 - CT chest 01/02/18 stable 3 mm nodule/ copd changes    At 3 mm and unchanged at f/u and more c/w subpleural node this meets criteria for a benign lesion and radiology recs no further f/u and I concur.   Low-dose CT lung cancer screening is recommended for patients who are 44-43 years of age with a 30+ pack-year history of smoking, and who are currently smoking or quit <=15 years ago.   Since she is out now > 15 years she no longer meets criteria for lung cancer screening/  advised.  Discussed in detail all the  indications, usual  risks and alternatives  relative to the benefits with patient who agrees to proceed with f/u prn new respiratory symptoms.     Rhinitis, chronic Allergy profile 02/04/2019 >  Eos 1.5  /  IgE  Pending   Her eosinophilia is striking and I suspect she would benefit from a formal allergy evaluation but she states that Claritin is all she needs for now.  I would like her to see Dr. Bruna Potter group if this proves not to be adequate.  DOE (dyspnea on exertion) Onset 12/2018 better on clariton - 02/04/2019   Walked RA  2 laps @  approx 259ft each @ fast pace  stopped due to  End of study, min sob with sat 100%    Not able to reproduce her complaint of being short of breath going to room to room and it is interesting that she perceives that Claritin has helped her more than anything else suggesting this is related to nasal obstruction and not to a pulmonary limitation.  See separate assessment for chronic rhinitis and possible need for allergy evaluation.  I would like her to return here for full PFTs once the COVID-19 restrictions have been lifted.     Total time devoted to counseling  > 50 % of initial 60 min office visit:  review case with pt/direct observation of portions of O2 ambulatory saturation study/ Discussion of options/alternatives/ personally creating written customized instructions  in presence of pt  then going over those specific  Instructions directly with the pt including how to use all of the meds but in particular covering each new medication in detail and the difference between the maintenance= "automatic" meds and the prns using an action plan format for the latter (If this problem/symptom => do that organization reading Left to right).  Please see AVS from this visit for a full list of these instructions which I personally wrote for this pt and  are unique to this visit.      Christinia Gully, MD 02/04/2019

## 2019-02-05 ENCOUNTER — Telehealth: Payer: Self-pay | Admitting: Internal Medicine

## 2019-02-05 DIAGNOSIS — M25652 Stiffness of left hip, not elsewhere classified: Secondary | ICD-10-CM | POA: Diagnosis not present

## 2019-02-05 DIAGNOSIS — M25551 Pain in right hip: Secondary | ICD-10-CM | POA: Diagnosis not present

## 2019-02-05 DIAGNOSIS — R262 Difficulty in walking, not elsewhere classified: Secondary | ICD-10-CM | POA: Diagnosis not present

## 2019-02-05 DIAGNOSIS — M25552 Pain in left hip: Secondary | ICD-10-CM | POA: Diagnosis not present

## 2019-02-05 DIAGNOSIS — M25651 Stiffness of right hip, not elsewhere classified: Secondary | ICD-10-CM | POA: Diagnosis not present

## 2019-02-05 DIAGNOSIS — M6281 Muscle weakness (generalized): Secondary | ICD-10-CM | POA: Diagnosis not present

## 2019-02-05 LAB — RESPIRATORY ALLERGY PROFILE REGION II ~~LOC~~

## 2019-02-05 LAB — INTERPRETATION:

## 2019-02-05 NOTE — Telephone Encounter (Signed)
Advised pt of results. Pt understood and nothing further is needed.   

## 2019-02-06 ENCOUNTER — Telehealth: Payer: Self-pay | Admitting: Internal Medicine

## 2019-02-06 NOTE — Telephone Encounter (Signed)
Called back [facility] [phone]), confirmed Erline Levine was calling to make sure we received the addendum report for the Chest CT.  Confirmed it was in Epic and will forward to Eric Form, NP to review.  Judson Roch, forwarding this to you to make sure that you were aware of the addendum on the CT Chest lung cancer screen that was originally dated 02/15/17. The addendum date is 02/06/19.

## 2019-02-12 DIAGNOSIS — M6281 Muscle weakness (generalized): Secondary | ICD-10-CM | POA: Diagnosis not present

## 2019-02-12 DIAGNOSIS — M25651 Stiffness of right hip, not elsewhere classified: Secondary | ICD-10-CM | POA: Diagnosis not present

## 2019-02-12 DIAGNOSIS — M25652 Stiffness of left hip, not elsewhere classified: Secondary | ICD-10-CM | POA: Diagnosis not present

## 2019-02-12 DIAGNOSIS — R262 Difficulty in walking, not elsewhere classified: Secondary | ICD-10-CM | POA: Diagnosis not present

## 2019-02-12 DIAGNOSIS — M25552 Pain in left hip: Secondary | ICD-10-CM | POA: Diagnosis not present

## 2019-02-12 DIAGNOSIS — M25551 Pain in right hip: Secondary | ICD-10-CM | POA: Diagnosis not present

## 2019-02-13 DIAGNOSIS — M25551 Pain in right hip: Secondary | ICD-10-CM | POA: Diagnosis not present

## 2019-02-13 DIAGNOSIS — M25652 Stiffness of left hip, not elsewhere classified: Secondary | ICD-10-CM | POA: Diagnosis not present

## 2019-02-13 DIAGNOSIS — M25651 Stiffness of right hip, not elsewhere classified: Secondary | ICD-10-CM | POA: Diagnosis not present

## 2019-02-13 DIAGNOSIS — R262 Difficulty in walking, not elsewhere classified: Secondary | ICD-10-CM | POA: Diagnosis not present

## 2019-02-13 DIAGNOSIS — M6281 Muscle weakness (generalized): Secondary | ICD-10-CM | POA: Diagnosis not present

## 2019-02-13 DIAGNOSIS — M25552 Pain in left hip: Secondary | ICD-10-CM | POA: Diagnosis not present

## 2019-02-17 DIAGNOSIS — M25551 Pain in right hip: Secondary | ICD-10-CM | POA: Diagnosis not present

## 2019-02-17 DIAGNOSIS — M25651 Stiffness of right hip, not elsewhere classified: Secondary | ICD-10-CM | POA: Diagnosis not present

## 2019-02-17 DIAGNOSIS — M6281 Muscle weakness (generalized): Secondary | ICD-10-CM | POA: Diagnosis not present

## 2019-02-17 DIAGNOSIS — R262 Difficulty in walking, not elsewhere classified: Secondary | ICD-10-CM | POA: Diagnosis not present

## 2019-02-17 DIAGNOSIS — M25652 Stiffness of left hip, not elsewhere classified: Secondary | ICD-10-CM | POA: Diagnosis not present

## 2019-02-17 DIAGNOSIS — M25552 Pain in left hip: Secondary | ICD-10-CM | POA: Diagnosis not present

## 2019-02-18 DIAGNOSIS — Z1212 Encounter for screening for malignant neoplasm of rectum: Secondary | ICD-10-CM | POA: Diagnosis not present

## 2019-02-19 DIAGNOSIS — M6281 Muscle weakness (generalized): Secondary | ICD-10-CM | POA: Diagnosis not present

## 2019-02-19 DIAGNOSIS — M25651 Stiffness of right hip, not elsewhere classified: Secondary | ICD-10-CM | POA: Diagnosis not present

## 2019-02-19 DIAGNOSIS — R262 Difficulty in walking, not elsewhere classified: Secondary | ICD-10-CM | POA: Diagnosis not present

## 2019-02-19 DIAGNOSIS — M25551 Pain in right hip: Secondary | ICD-10-CM | POA: Diagnosis not present

## 2019-02-19 DIAGNOSIS — M25552 Pain in left hip: Secondary | ICD-10-CM | POA: Diagnosis not present

## 2019-02-19 DIAGNOSIS — M25652 Stiffness of left hip, not elsewhere classified: Secondary | ICD-10-CM | POA: Diagnosis not present

## 2019-03-05 ENCOUNTER — Other Ambulatory Visit: Payer: Self-pay | Admitting: Internal Medicine

## 2019-03-20 ENCOUNTER — Other Ambulatory Visit (HOSPITAL_COMMUNITY): Admission: RE | Admit: 2019-03-20 | Payer: Medicare HMO | Source: Ambulatory Visit

## 2019-03-21 ENCOUNTER — Other Ambulatory Visit (HOSPITAL_COMMUNITY)
Admission: RE | Admit: 2019-03-21 | Discharge: 2019-03-21 | Disposition: A | Discharge status: Home / Self Care | Payer: Medicare HMO | Source: Ambulatory Visit | Attending: Internal Medicine | Admitting: Internal Medicine

## 2019-03-21 DIAGNOSIS — Z1159 Encounter for screening for other viral diseases: Secondary | ICD-10-CM | POA: Insufficient documentation

## 2019-03-22 LAB — NOVEL CORONAVIRUS, NAA (HOSP ORDER, SEND-OUT TO REF LAB; TAT 18-24 HRS): SARS-CoV-2, NAA: NOT DETECTED

## 2019-03-24 ENCOUNTER — Other Ambulatory Visit: Payer: Self-pay | Admitting: Internal Medicine

## 2019-03-24 DIAGNOSIS — R0600 Dyspnea, unspecified: Secondary | ICD-10-CM

## 2019-03-24 DIAGNOSIS — R0609 Other forms of dyspnea: Secondary | ICD-10-CM

## 2019-03-25 ENCOUNTER — Other Ambulatory Visit: Payer: Self-pay

## 2019-03-25 ENCOUNTER — Ambulatory Visit: Payer: Medicare HMO | Admitting: *Deleted

## 2019-03-25 ENCOUNTER — Ambulatory Visit: Payer: Medicare HMO | Admitting: Internal Medicine

## 2019-03-25 ENCOUNTER — Encounter: Payer: Self-pay | Admitting: Internal Medicine

## 2019-03-25 DIAGNOSIS — R0609 Other forms of dyspnea: Secondary | ICD-10-CM

## 2019-03-25 DIAGNOSIS — J310 Chronic rhinitis: Secondary | ICD-10-CM | POA: Diagnosis not present

## 2019-03-25 DIAGNOSIS — R0600 Dyspnea, unspecified: Secondary | ICD-10-CM

## 2019-03-25 LAB — PULMONARY FUNCTION TEST
DL/VA % pred: 115 %
DL/VA: 4.71 ml/min/mmHg/L
DLCO unc % pred: 104 %
DLCO unc: 22.31 ml/min/mmHg
FEF 25-75 Post: 2.54 L/sec
FEF 25-75 Pre: 2.41 L/sec
FEF2575-%Change-Post: 5 %
FEF2575-%Pred-Post: 118 %
FEF2575-%Pred-Pre: 112 %
FEV1-%Change-Post: 5 %
FEV1-%Pred-Post: 95 %
FEV1-%Pred-Pre: 89 %
FEV1-Post: 2.44 L
FEV1-Pre: 2.31 L
FEV1FVC-%Change-Post: 0 %
FEV1FVC-%Pred-Pre: 103 %
FEV6-%Change-Post: 7 %
FEV6-%Pred-Post: 95 %
FEV6-%Pred-Pre: 88 %
FEV6-Post: 3.08 L
FEV6-Pre: 2.86 L
FEV6FVC-%Pred-Post: 104 %
FEV6FVC-%Pred-Pre: 104 %
FVC-%Change-Post: 5 %
FVC-%Pred-Post: 91 %
FVC-%Pred-Pre: 86 %
FVC-Post: 3.08 L
FVC-Pre: 2.9 L
Post FEV1/FVC ratio: 79 %
Post FEV6/FVC ratio: 100 %
Pre FEV1/FVC ratio: 79 %
Pre FEV6/FVC Ratio: 100 %
RV % pred: 102 %
RV: 2.32 L
TLC % pred: 96 %
TLC: 5.24 L

## 2019-03-25 NOTE — Progress Notes (Signed)
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 Pulmonary Consult    Self referral for pulmonary nodule. Pt c/o DOE x 4 wks- gets winded walking room to room sometimes- clariton has helped some.   Dyspnea:  Room to room but better since on clariton Cough: none Sleep: on side / bed is flat / one pillow  SABA use: no inhalers  rec No change rx     03/25/2019  f/u ov/Desiree Snow re: doe  Chief Complaint  Patient presents with  . Follow-up    PFT's done today. Breathing has improved since her last visit.   Dyspnea:  Not limited by breathing from desired activities  / steps  ok now / more limited by R hip  Cough: assoc pnds/ throat clearing day > noct  No better with clariton  Sleeping: one pillow  SABA use: none 02: none    No obvious day to day or daytime variability or assoc excess/ purulent sputum or mucus plugs or hemoptysis or cp or chest tightness, subjective wheeze or overt sinus or hb symptoms.   Sleeping fine almost flat  without nocturnal  or early am exacerbation  of respiratory  c/o's or need for noct saba. Also denies any obvious fluctuation of symptoms with weather or environmental changes or other aggravating or alleviating factors except as outlined above   No unusual exposure hx or h/o childhood pna/ asthma or knowledge of premature birth.  Current Allergies, Complete Past Medical History, Past Surgical History, Family History, and Social History were reviewed in [employer] record.  ROS   The following are not active complaints unless bolded Hoarseness, sore throat, dysphagia, dental problems, itching, sneezing,  nasal congestion or discharge of excess mucus or purulent secretions, ear ache,   fever, chills, sweats, unintended wt loss or wt gain, classically pleuritic or exertional cp,  orthopnea pnd or arm/hand swelling  or leg swelling, presyncope, palpitations, abdominal pain, anorexia, nausea, vomiting, diarrhea  or change in bowel habits or change in bladder habits, change in stools or change in urine, dysuria, hematuria,  rash, arthralgias, visual complaints, headache, numbness, weakness or ataxia or problems with walking or coordination,  change in mood or  memory.        Current Meds  Medication Sig  . Calcium Carbonate-Vit D-Min (CALCIUM 1200 PO) Take 1 tablet by mouth daily.  . Cholecalciferol (VITAMIN D3) 1000 units CAPS Take 1 capsule by mouth daily.  Marland Kitchen CINNAMON PO Take 1 tablet by mouth daily.  . clorazepate (TRANXENE) 3.75 MG tablet Take 1 tablet daily as needed by mouth.  . famotidine (PEPCID) 20 MG tablet Take 20 mg by mouth daily.   Marland Kitchen levETIRAcetam (KEPPRA) 500 MG tablet 500 mg in the am and 250 mg in the pm  . loratadine (CLARITIN) 10 MG tablet Take 10 mg by mouth daily as needed for allergies.  . Magnesium 250 MG TABS Take 1 tablet by mouth daily.  . Multiple Vitamin (MULTIVITAMIN) tablet Take 1  tablet by mouth daily.  . TURMERIC PO Take 1 tablet by mouth daily.  Marland Kitchen UNABLE TO FIND Med Name: Collagen 2 otc once daily  . UNABLE TO FIND Med Name: CLA (over the counter) daily  . Zinc 50 MG CAPS Take 1 capsule by mouth daily.               Past Medical History:  Diagnosis Date  . Allergy   . Chicken pox   . Diverticulitis   . GERD (gastroesophageal reflux disease)   . Seizures ([place])    resolved       Objective:       Wt Readings from Last 3 Encounters:  03/25/19 221 lb (100.2 kg)  02/04/19 221 lb 6.4 oz (100.4 kg)  12/25/17 216 lb 8 oz (98.2  kg)     Vital signs reviewed - Note on arrival 02 sats  100% on RA     Pleasant amb obese wf nad  HEENT: nl dentition, turbinates bilaterally, and oropharynx. Nl external ear canals without cough reflex   NECK :  without JVD/Nodes/TM/ nl carotid upstrokes bilaterally   LUNGS: no acc muscle use,  Nl contour chest which is clear to A and P bilaterally without cough on insp or exp maneuvers   CV:  RRR  no s3 or murmur or increase in P2, and no edema   ABD:  soft and nontender with nl inspiratory excursion in the supine position. No bruits or organomegaly appreciated, bowel sounds nl  MS:  Nl gait/ ext warm without deformities, calf tenderness, cyanosis or clubbing No obvious joint restrictions   SKIN: warm and dry without lesions    NEURO:  alert, approp, nl sensorium with  no motor or cerebellar deficits apparent.                   Assessment   Incidental pulmonary nodule Incidental finding of 3 mm non-concerning Pulmonary nodule  on  Contrast CT of neck 04/2017 - CT chest 01/02/18 stable 3 mm nodule/ copd changes    At 3 mm and unchanged at f/u and more c/w subpleural node this meets criteria for a benign lesion and radiology recs no further f/u and I concur.   Low-dose CT lung cancer screening is recommended for patients who are 52-68 years of age with a 30+ pack-year history of smoking, and who are currently smoking or quit <=15 years ago.   Since she is out now > 15 years she no longer meets criteria for lung cancer screening/ advised.  Discussed in detail all the  indications, usual  risks and alternatives  relative to the benefits with patient who agrees to proceed with f/u prn new respiratory symptoms.     Rhinitis, chronic Allergy profile 02/04/2019 >  Eos 1.5  /  IgE  Pending   Her eosinophilia is striking and I suspect she would benefit from a formal allergy evaluation but she states that Claritin is all she needs for now.  I would like her to see Dr.  Bruna Potter group if this proves not to be adequate.      DOE (dyspnea on exertion) Onset 12/2018 better on clariton - 02/04/2019   Walked RA  2 laps @  approx 245ft each @ fast pace  stopped due to  End of study, min sob with sat 100%    Not able to reproduce her complaint of being short of breath going to room to room and it is interesting that she  perceives that Claritin has helped her more than anything else suggesting this is related to nasal obstruction and not to a pulmonary limitation.  See separate assessment for chronic rhinitis and possible need for allergy evaluation.  I would like her to return here for full PFTs once the COVID-19 restrictions have been lifted.

## 2019-03-25 NOTE — Patient Instructions (Signed)
Option for nasal symptoms is singulair 10 mg one daily as maintenance  Best otc for drippy nose is zyrtec 10 mg daily as needed    If not satisfied I'll be happy to refer you to a qualified allergist   If you are satisfied with your treatment plan,  let your doctor know and he/she can either refill your medications or you can return here when your prescription runs out.     If in any way you are not 100% satisfied,  please tell us.  If 100% better, tell your friends!  Pulmonary follow up is as needed

## 2019-03-27 ENCOUNTER — Encounter: Payer: Self-pay | Admitting: Internal Medicine

## 2019-03-27 NOTE — Assessment & Plan Note (Signed)
Onset 12/2018  - 02/04/2019   Walked RA  2 laps @  approx 276ft each @ fast pace  stopped due to  End of study, min sob with sat 100%  - PFT's  03/25/2019  FEV1 2.44 (95 % ) ratio 0.79  p 5 % improvement from saba p nothing prior to study with DLCO  104 % corrects to 115 % for alv volume and ERV 19%  At this point not really limited by doe and main finding is related to obesity (low erv) with no evidence at all of copd despite smoking hx   No pulmonary f/u needed for this problem/ reviewed

## 2019-03-27 NOTE — Assessment & Plan Note (Signed)
Allergy profile 02/04/2019 >  Eos 1.5  /  IgE  14  RAST neg  - 03/25/2019 rec change clariton to zytec, add daily singulair if not better and allergy eval for elevated eos if needed    I had an extended final summary discussion with the patient reviewing all relevant studies completed to date and  lasting 15 to 20 minutes of a 25 minute visit on the following issues:   1) reviewed pfts and importance of wt  2) reviewed options going forward to manage rhinitis using step up approach   3) Each maintenance medication was reviewed in detail including most importantly the difference between maintenance and as needed and under what circumstances the prns are to be used.  Please see AVS for specific  Instructions which are unique to this visit and I personally typed out  which were reviewed in detail in writing with the patient and a copy provided.     Pulmonary f/u is prn

## 2019-04-03 ENCOUNTER — Ambulatory Visit: Payer: Medicare HMO | Admitting: Psychiatry

## 2019-04-03 ENCOUNTER — Other Ambulatory Visit: Payer: Self-pay

## 2019-04-03 DIAGNOSIS — R69 Illness, unspecified: Secondary | ICD-10-CM | POA: Diagnosis not present

## 2019-04-03 DIAGNOSIS — F411 Generalized anxiety disorder: Secondary | ICD-10-CM
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
## 2019-04-14 MED ORDER — CETIRIZINE HCL 10 MG PO TABS: 10.0000 mg | ORAL_TABLET | Freq: Every day | ORAL | 5 refills | Status: DC

## 2019-04-22 ENCOUNTER — Other Ambulatory Visit: Payer: Self-pay

## 2019-04-22 ENCOUNTER — Ambulatory Visit: Payer: Medicare HMO | Admitting: Psychiatry

## 2019-04-22 DIAGNOSIS — F411 Generalized anxiety disorder: Secondary | ICD-10-CM | POA: Diagnosis not present

## 2019-04-22 DIAGNOSIS — R69 Illness, unspecified: Secondary | ICD-10-CM | POA: Diagnosis not present
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
## 2019-05-04 ENCOUNTER — Ambulatory Visit (INDEPENDENT_AMBULATORY_CARE_PROVIDER_SITE_OTHER): Payer: Medicare HMO | Admitting: Psychiatry

## 2019-05-04 ENCOUNTER — Other Ambulatory Visit: Payer: Self-pay

## 2019-05-04 DIAGNOSIS — F411 Generalized anxiety disorder: Secondary | ICD-10-CM

## 2019-05-04 DIAGNOSIS — R69 Illness, unspecified: Secondary | ICD-10-CM | POA: Diagnosis not present
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
## 2019-05-14 DIAGNOSIS — R69 Illness, unspecified: Secondary | ICD-10-CM | POA: Diagnosis not present

## 2019-05-18 ENCOUNTER — Ambulatory Visit (INDEPENDENT_AMBULATORY_CARE_PROVIDER_SITE_OTHER): Payer: Medicare HMO | Admitting: Psychiatry

## 2019-05-18 ENCOUNTER — Other Ambulatory Visit: Payer: Self-pay

## 2019-05-18 DIAGNOSIS — F411 Generalized anxiety disorder: Secondary | ICD-10-CM

## 2019-05-18 DIAGNOSIS — R69 Illness, unspecified: Secondary | ICD-10-CM | POA: Diagnosis not present
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
## 2019-06-01 ENCOUNTER — Other Ambulatory Visit: Payer: Self-pay

## 2019-06-01 ENCOUNTER — Ambulatory Visit (INDEPENDENT_AMBULATORY_CARE_PROVIDER_SITE_OTHER): Payer: Medicare HMO | Admitting: Psychiatry

## 2019-06-01 DIAGNOSIS — F411 Generalized anxiety disorder: Secondary | ICD-10-CM

## 2019-06-01 DIAGNOSIS — R69 Illness, unspecified: Secondary | ICD-10-CM | POA: Diagnosis not present
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
## 2019-06-16 ENCOUNTER — Other Ambulatory Visit: Payer: Self-pay

## 2019-06-16 ENCOUNTER — Ambulatory Visit: Payer: Medicare HMO | Admitting: Psychiatry

## 2019-06-16 DIAGNOSIS — R69 Illness, unspecified: Secondary | ICD-10-CM | POA: Diagnosis not present

## 2019-06-16 DIAGNOSIS — F411 Generalized anxiety disorder: Secondary | ICD-10-CM | POA: Diagnosis not present

## 2019-06-16 NOTE — Progress Notes (Signed)
[facility] Counsel[facility] Adult Progress Note  Name: Desiree Snow Date: 06/16/2019 MRN: [id number] DOB: [birth year] PCP: Haywood Pao, MD  Time spent: 60 minutes   5:00pm to 6:00pm     Symptoms:  Anxiety, some depression, self-esteem issues, self-doubt, quick to mis-judge and make inaccurate assumptions,  Over-personalizes. Jumps to conclusions easily. Resistant to working on some issues within her family from the past and prefers instead to work on more current things yet it's difficult to discern often times what patient is seeking. Very few friends and has struggle with that over the years.  Any new health concerns or new medications since last appt: None reported   Mental Status Exam:   Appearance:   neat  Behavior:  Appropriate, sharing  Motor:  normal  Speech/Language:   Clear and coherent  Affect:  anxious  Mood:  Anxious, some irritability  Thought process:  normal  Thought content:    WNL  Sensory/Perceptual disturbances:    WNL  Orientation:  Oriented to person, place, time, situation, date, day of week, month and year  Attention:  good  Concentration:  good  Memory:  WNL  Fund of knowledge:   good  Insight:    good  Judgment:   good  Impulse Control:  good    Risk Assessment: Danger to Self:  no  Self-injurious Behavior: no Danger to Others: no Duty to Warn:no Physical Aggression / Violence:  no Access to Firearms a concern: no Gang Involvement :no   Subjective:    Patient not quite as edgy today and seemed a bit more trusting. Anxious and some mild depression reported. Appears apprehensive. Difficult to engage at times and trying at this point to establish more trust, hoping that will help her to talk in more detail about some of the things she is seeking help with.  Encouraged patient to ask any questions she may have and to take risk to raise issues that hold her back.  Impatient and still frequently misinterprets things said and is quick to get on the  defensive and take things in a way that were not intended nor indicated.Working more on goals today.     ICD-10-CM   1. GAD (generalized anxiety disorder)        Plan of Care: Patient not signing tx plan on computer screen due to [place].  Treatment Goals:  Long Term Goal: Enhance the ability to effectively manage the full variety of life's anxieties.  (Progressing)- Patient motivated. Trying to help her be more trusting which is more important due to history.  Short Term goal: Increase understanding of beliefs and messages that produce anxiety and uneasiness.  Strategy: Assist patient in becoming aware and exploring key unresolved life conflicts and encourage her work towards resolution.   (Progressing)- Patient showing some motivation but also some resistance to past issues.    To return within 2 wks.     Shanon Ace, LCSW

## 2019-06-29 ENCOUNTER — Other Ambulatory Visit: Payer: Self-pay

## 2019-06-29 ENCOUNTER — Ambulatory Visit: Payer: Medicare HMO | Admitting: Psychiatry

## 2019-06-29 ENCOUNTER — Ambulatory Visit (INDEPENDENT_AMBULATORY_CARE_PROVIDER_SITE_OTHER): Payer: Medicare HMO | Admitting: Psychiatry

## 2019-06-29 DIAGNOSIS — R69 Illness, unspecified: Secondary | ICD-10-CM | POA: Diagnosis not present

## 2019-06-29 DIAGNOSIS — F411 Generalized anxiety disorder: Secondary | ICD-10-CM

## 2019-06-29 NOTE — Progress Notes (Signed)
      [facility] Counselor/Therapist Progress Note  Patient ID: Desiree Snow, MRN: [id number],    Date: 06/29/2019  Time Spent:   60 minutes      7:48am to 8:50am   Treatment Type: Individual Therapy  Reported Symptoms:  Anxiety, fears of being rejected, feels alone but not lonely, Quick to assume others are negative towards her  Mental Status Exam:  Appearance:   Casual     Behavior:  Appropriate and Sharing  Motor:  Normal  Speech/Language:   Normal Rate  Affect:  anxious  Mood:  anxious  Thought process:  normal  Thought content:    WNL  Sensory/Perceptual disturbances:    WNL  Orientation:  oriented to person, place, time/date, situation, day of week, month of year and year  Attention:  Good  Concentration:  Good  Memory:  WNL  Fund of knowledge:   Good  Insight:    Good  Judgment:   Good  Impulse Control:  Good   Risk Assessment: Danger to Self:  No Self-injurious Behavior: No Danger to Others: No Duty to Warn:no Physical Aggression / Violence:No  Access to Firearms a concern: No  Gang Involvement:No   Subjective:  Patient in today with anxiety, second guessing other people, obvious trust issues, overly sensitive about being rejected, always feels "I'm on the outside, looking in, like I've never really belonged", goes looking for rejection, discounts any compliments or positive comments made to her.   Interventions: Cognitive Behavioral Therapy and Solution-Oriented/Positive Psychology  Diagnosis:   ICD-10-CM   1. GAD (generalized anxiety disorder)  F41.1     Plan of Care:  Treatment goals may stay on treatment plan as patients is working on strategies to meet her goals, and progess is noted every session in "Progress" section. **Trreatment goals changed some today to better meet patient's needs.  Treatment Goals: Patient not signing tx plan on computer screen due to [place].  Long Term Goal: Resolve the core conflicts that are the sources of  anxiety.  Short Term goal: Increase her understanding of beliefs and messages that produce anxiety, worry, and uneasiness.  Strategy: Assist patient in becoming aware and exploring key unresolved life conflicts and encourage her work towards resolution.    (Progressing)-  Patient very sensitive to any perceived potential "rejection" for example therapist had to reschedule with her due to a dental procedure, and even though I was able to get her an earlier appt today, she was interpreting the move in appt time as potential rejection.  I listened to her concerns and acknowledged the strength it took for her to share that, and then explained to her that the change in appt was in no way related to any type of possible rejection.  Patient seemed to understand this and we talked about how to gain from this experience and be able to delay any automatic thoughts in situations in the future.  Looked at how earlier experiences in life have contributed to her current negative automatic beliefs and assumptions.  Gave examples as well as healthier ways for her to look at situations without automatic judgement.  Building trust is critical for patient to progress.   Next appt in approx 2 weeks.   Shanon Ace, LCSW

## 2019-07-03 ENCOUNTER — Other Ambulatory Visit: Payer: Self-pay

## 2019-07-07 ENCOUNTER — Ambulatory Visit (INDEPENDENT_AMBULATORY_CARE_PROVIDER_SITE_OTHER): Payer: Medicare HMO | Admitting: Endocrinology

## 2019-07-07 ENCOUNTER — Other Ambulatory Visit: Payer: Self-pay

## 2019-07-07 ENCOUNTER — Encounter: Payer: Self-pay | Admitting: Endocrinology

## 2019-07-07 VITALS — BP 126/70 | HR 97 | Ht 66.5 in | Wt 222.6 lb

## 2019-07-07 DIAGNOSIS — E042 Nontoxic multinodular goiter: Secondary | ICD-10-CM

## 2019-07-07 LAB — TSH: TSH: 0.87 u[IU]/mL (ref 0.35–4.50)

## 2019-07-07 NOTE — Patient Instructions (Addendum)
Let's recheck the ultrasound.  you will receive a phone call, about a day and time for an appointment. Blood tests are requested for you today.  We'll let you know about the results.   Please come back for a follow-up appointment in 1 year.   

## 2019-07-07 NOTE — Progress Notes (Signed)
Subjective:    Patient ID: Desiree Snow, female    DOB: 03-28-1951, 68 y.o.   MRN: [id number]  HPI Pt returns for f/u of multinodular goiter: dx'ed 2018, incidentally on a CT scan of the chest  in 2018; [place]: Nodule #4 in the inferior left thyroid lobe and Nodule #5 in the mid left thyroid lobe both meet criteria for biopsy.  Nodule #3 in the superior left thyroid lobe meets criteria for 1 year follow-up; bx in 2018 (LLP, labeled # 8 on [place]) showed BENIGN FOLLICULAR NODULE ([place]; f/u US in 2019 shows LMP nodule (labeled #7 is stable in size and continues to meet criteria for bx; 8 is stable and previously underwent biopsy).  Pt says she can notice the nodules, but is unaware of any change in size.   Past Medical History:  Diagnosis Date  . Allergy   . Chicken pox   . Diverticulitis   . GERD (gastroesophageal reflux disease)   . Seizures ([place])    resolved    Past Surgical History:  Procedure Laterality Date  . ADENOIDECTOMY  1963  . BREAST EXCISIONAL BIOPSY Left   . BREAST EXCISIONAL BIOPSY Right   . BREAST LUMPECTOMY Left 2009   benign  . LOBECTOMY FOR SEIZURE FOCUS    . SPLENECTOMY  1961  . TONSILLECTOMY  1963  . WISDOM TOOTH EXTRACTION      Social History   Socioeconomic History  . Marital status: Single    Spouse name: Not on file  . Number of children: Not on file  . Years of education: Not on file  . Highest education level: Not on file  Occupational History  . Not on file  Social Needs  . Financial resource strain: Not on file  . Food insecurity    Worry: Not on file    Inability: Not on file  . Transportation needs    Medical: Not on file    Non-medical: Not on file  Tobacco Use  . Smoking status: Former Smoker    Packs/day: 2.00    Years: 29.00    Pack years: 58.00    Types: Cigarettes    Quit date: 10/08/2002    Years since quitting: 16.7  . Smokeless tobacco: Never Used  Substance and Sexual Activity  . Alcohol use: No   Frequency: Never  . Drug use: No  . Sexual activity: Not on file  Lifestyle  . Physical activity    Days per week: Not on file    Minutes per session: Not on file  . Stress: Not on file  Relationships  . Social Herbalist on phone: Not on file    Gets together: Not on file    Attends religious service: Not on file    Active member of club or organization: Not on file    Attends meetings of clubs or organizations: Not on file    Relationship status: Not on file  . Intimate partner violence    Fear of current or ex partner: Not on file    Emotionally abused: Not on file    Physically abused: Not on file    Forced sexual activity: Not on file  Other Topics Concern  . Not on file  Social History Narrative  . Not on file    Current Outpatient Medications on File Prior to Visit  Medication Sig Dispense Refill  . Calcium Carbonate-Vit D-Min (CALCIUM 1200 PO) Take 1 tablet by mouth daily.    Marland Kitchen  cetirizine (ZYRTEC) 10 MG tablet Take 1 tablet (10 mg total) by mouth daily. 30 tablet 5  . Cholecalciferol (VITAMIN D3) 1000 units CAPS Take 1 capsule by mouth daily.    Marland Kitchen CINNAMON PO Take 1 tablet by mouth daily.    . clorazepate (TRANXENE) 3.75 MG tablet Take 1 tablet daily as needed by mouth.    . famotidine (PEPCID) 20 MG tablet Take 20 mg by mouth daily.     Marland Kitchen levETIRAcetam (KEPPRA) 500 MG tablet 500 mg in the am and 250 mg in the pm    . Magnesium 250 MG TABS Take 1 tablet by mouth daily.    . Multiple Vitamin (MULTIVITAMIN) tablet Take 1 tablet by mouth daily.    . TURMERIC PO Take 1 tablet by mouth daily.    Marland Kitchen UNABLE TO FIND Med Name: Collagen 2 otc once daily    . UNABLE TO FIND Med Name: CLA (over the counter) daily    . Zinc 50 MG CAPS Take 1 capsule by mouth daily.     No current facility-administered medications on file prior to visit.     Allergies  Allergen Reactions  . Metronidazole Other (See Comments)    Strange feeling and inability to function  .  Quinolones   . Scopolamine Hives     on her fingers no itchng  . Penicillins Rash    Family History  Problem Relation Age of Onset  . Pneumonia Mother   . Dementia Father   . Breast cancer Maternal Aunt   . Goiter Neg Hx   . Thyroid cancer Neg Hx   . Thyroid nodules Neg Hx   . Thyroid disease Neg Hx     BP 126/70 (BP Location: Left Arm, Patient Position: Sitting, Cuff Size: Large)   Pulse 97   Ht 5' 6.5" (1.689 m)   Wt 222 lb 9.6 oz (101 kg)   SpO2 98%   BMI 35.39 kg/m    Review of Systems Denies neck pain.      Objective:   Physical Exam VITAL SIGNS:  See vs page.  GENERAL: no distress.   NECK: left and right thyroid nodules are again palpable.    Lab Results  Component Value Date   TSH 0.87 07/07/2019       Assessment & Plan:  MNG: due for f/u.  We discussed bx vs continued surveillance.  She will consider.    Patient Instructions  Let's recheck the ultrasound.  you will receive a phone call, about a day and time for an appointment.   Blood tests are requested for you today.  We'll let you know about the results.  Please come back for a follow-up appointment in 1 year.

## 2019-07-13 ENCOUNTER — Other Ambulatory Visit: Payer: Self-pay

## 2019-07-13 ENCOUNTER — Ambulatory Visit: Payer: Medicare HMO | Admitting: Psychiatry

## 2019-07-13 DIAGNOSIS — F411 Generalized anxiety disorder: Secondary | ICD-10-CM

## 2019-07-13 DIAGNOSIS — R69 Illness, unspecified: Secondary | ICD-10-CM | POA: Diagnosis not present

## 2019-07-13 NOTE — Progress Notes (Signed)
[facility] Counselor/Therapist Progress Note  Patient ID: Desiree Snow, MRN: [id number],    Date: 07/13/2019  Time Spent: 60 minutes    5:00pm to 6:00pm  Treatment Type: Individual Therapy  Reported Symptoms:  anxiety, fear of not having control over some things, fear of abandonment, feeling like I don't belong even though I've belonged to groups, feel like I'm on the outside looking in, lots of pain from her past.  Mental Status Exam:  Appearance:   Neat     Behavior:  Appropriate and Sharing  Motor:  Normal  Speech/Language:   Normal Rate  Affect:  anxious  Mood:  anxious  Thought process:  goal directed  Thought content:    WNL  Sensory/Perceptual disturbances:    WNL  Orientation:  oriented to person, place, time/date, situation, day of week, month of year and year  Attention:  Good  Concentration:  Good  Memory:  WNL  Fund of knowledge:   Good  Insight:    Fair  Judgment:   Good  Impulse Control:  Fair   Risk Assessment: Danger to Self:  No Self-injurious Behavior: No Danger to Others: No Duty to Warn:no Physical Aggression / Violence:No  Access to Firearms a concern: No  Gang Involvement:No   Subjective:  Patient states she is anxious and feeling on the outside in most situations.  Did homework about negative things that impact her, negative feelings toward herself. Very self-negating, and overpersonalizes a lot.  Fears abandonment. Overly sensitive.  Quick to make assumptions which often end up being inaccurate.    Interventions: Cognitive Behavioral Therapy and Ego-Supportive  Diagnosis:   ICD-10-CM   1. GAD (generalized anxiety disorder)  F41.1      Plan : Treatment goals may stay on treatment plan as patients is working on strategies to meet her goals, and progess is noted every session in "Progress" section.   Treatment Goals: Patient not signing tx plan on computer screen due to [place].  Long Term Goal: Resolve the core conflicts  that are the sources of anxiety.  Short Term goal: Increase her understanding of beliefs and messages that produce anxiety, worry, and uneasiness.  Strategy: Assist patient in becoming aware and exploring key unresolved life conflicts and encourage her work towards resolution.   (Progressing)-  Patient doing well in processing some core issues that date back to her childhood and lots of negativity  In her life during those years.  Talked a lot about how her guardedness, feelings of not fitting in, over-sensitivity, and over-personalization are all related to situations in her past where core conflicts still exist.  She did some of her homework on that subject by sharing a list she put on her phone that documented the ways she feels the core conflicts have shaped her life. Went through each one of these today.  She seems to be developing more trust here as we talk rather than second-guessing and discounting a lot.  Seems to be recognizing that this is a place where she is valued and respected and those are very different feelings for her. Motivation is increased at this point and she seems more goal-oriented, asking appropriate questions about her process in working through past hurts and conflicts. I gave her some feedback on some of the growth I am seeing in her and the respect I have for her as she works hard on her goals.  Return appt in 2 wks.   Shanon Ace, LCSW

## 2019-07-14 DIAGNOSIS — R69 Illness, unspecified: Secondary | ICD-10-CM | POA: Diagnosis not present

## 2019-07-27 ENCOUNTER — Ambulatory Visit: Payer: Medicare HMO | Admitting: Psychiatry

## 2019-07-27 ENCOUNTER — Other Ambulatory Visit: Payer: Self-pay

## 2019-07-27 ENCOUNTER — Ambulatory Visit
Admission: RE | Admit: 2019-07-27 | Discharge: 2019-07-27 | Disposition: A | Discharge status: Home / Self Care | Payer: Medicare HMO | Source: Ambulatory Visit | Attending: Endocrinology | Admitting: Endocrinology

## 2019-07-27 DIAGNOSIS — R69 Illness, unspecified: Secondary | ICD-10-CM | POA: Diagnosis not present

## 2019-07-27 DIAGNOSIS — E042 Nontoxic multinodular goiter: Secondary | ICD-10-CM

## 2019-07-27 DIAGNOSIS — F411 Generalized anxiety disorder: Secondary | ICD-10-CM

## 2019-07-27 DIAGNOSIS — E041 Nontoxic single thyroid nodule: Secondary | ICD-10-CM | POA: Diagnosis not present

## 2019-07-27 NOTE — Progress Notes (Signed)
      [facility] Counselor/Therapist Progress Note  Patient ID: Desiree Snow, MRN: [id number],    Date: 07/27/2019  Time Spent: 60 minutes   5:00pm to 6:00pm  Treatment Type: Individual Therapy  Reported Symptoms:  Anxiety, personalizes, frustration, fears  Mental Status Exam:  Appearance:   Neat     Behavior:  Appropriate and Sharing  Motor:  Normal  Speech/Language:   Normal Rate  Affect:  anxious  Mood:  anxious  Thought process:  goal directed  Thought content:    WNL  Sensory/Perceptual disturbances:    WNL  Orientation:  oriented to person, place, time/date, situation, day of week, month of year and year  Attention:  Good  Concentration:  Good  Memory:  WNL  Fund of knowledge:   Good  Insight:    Good  Judgment:   Good  Impulse Control:  Good   Risk Assessment: Danger to Self:  No Self-injurious Behavior: No Danger to Others: No Duty to Warn:no Physical Aggression / Violence:No  Access to Firearms a concern: No  Gang Involvement:No   Subjective: Patient in today with anxiety, frustration, some irritability ad overpersonalization, second-guessing people.  Interventions: Cognitive Behavioral Therapy and Solution-Oriented/Positive Psychology  Diagnosis:   ICD-10-CM   1. GAD (generalized anxiety disorder)  F41.1      Plan : Treatment goals may stay on treatment plan as patient is working on strategies to meet her goals, and progess is noted every session in "Progress" section.   Treatment Goals: Patient not signing tx plan on computer screen due to [place].  Long Term Goal: Resolve the core conflicts that are the sources of anxiety.  Short Term goal: Increase her understanding of beliefs and messages that produce anxiety, worry, and uneasiness.  Strategy: Assist patient in becoming aware and exploring key unresolved life conflicts and encourage her work towards resolution.   (Progressing)- Patient difficult time following through on  homework and easy to assume that it may not help. Still a lot of negative self-talk and viewing self very critical.  Did do one part of her homework re: automatic thoughts and we processed this and practiced what it looks like to interrupt negative thoughts and then replace them with thoughts that are more positive and reality-based.  She did an example also in session today and did better in following through.  Some self-esteem issues impacting her in this work and we did focus on that some today also which seem to result in better understanding and engagement on patient's part. Not quite as guarded as she looks at "core conflicts" per long term goal, and validated patient on this.  Able to see some of those conflicts in ways that they don't totally define her. Some of her feelings can fluctuate and today she is presenting a little more open and less defensive. Shared some more feedback today as far as growth I see in her and she was receptive.  To continue her strategy of self-monitoring for negative/anxious/depressive thoughts and keep interrupting them and replacing with more productive, reality-based, and positive thought patterns, being sure to notice any changes in feelings and her outlook. Goal review and progress noted with patient.    Next appt within 2 weeks.   Shanon Ace, LCSW

## 2019-08-10 ENCOUNTER — Other Ambulatory Visit: Payer: Self-pay

## 2019-08-10 ENCOUNTER — Ambulatory Visit: Payer: Medicare HMO | Admitting: Psychiatry

## 2019-08-10 DIAGNOSIS — F411 Generalized anxiety disorder: Secondary | ICD-10-CM

## 2019-08-10 DIAGNOSIS — R69 Illness, unspecified: Secondary | ICD-10-CM | POA: Diagnosis not present

## 2019-08-10 NOTE — Progress Notes (Signed)
      [facility] Counselor/Therapist Progress Note  Patient ID: Desiree Snow, MRN: [id number],    Date: 08/10/2019  Time Spent: 60 minutes   5:00pm to 6:00pm  Treatment Type: Individual Therapy  Reported Symptoms: anxiety, fears, self-doubt  Mental Status Exam:  Appearance:   Casual     Behavior:  Appropriate and Sharing  Motor:  Normal  Speech/Language:   Normal Rate  Affect:  fear, anxious  Mood:  anxious  Thought process:  goal directed  Thought content:    WNL  Sensory/Perceptual disturbances:    WNL  Orientation:  oriented to person, place, time/date, situation, day of week, month of year and year  Attention:  Good  Concentration:  Good  Memory:  WNL  Fund of knowledge:   Good  Insight:    Good  Judgment:   Good  Impulse Control:  Good   Risk Assessment: Danger to Self:  No Self-injurious Behavior: No Danger to Others: No Duty to Warn:no Physical Aggression / Violence:No  Access to Firearms a concern: No  Gang Involvement:No   Subjective:  Patient in today reporting anxiety, fears of future, self-doubt.  Sometimes is "more purposeful", some frustration, some times of calmness.    Interventions: Solution-Oriented/Positive Psychology and Ego-Supportive  Diagnosis:   ICD-10-CM   1. GAD (generalized anxiety disorder)  F41.1     Plan: Treatment goals may stay on treatment plan as patient is working on strategies to meet her goals, and progess is noted every session in "Progress" section.   Treatment Goals: Patient not signing tx plan on computer screen due to [place].  Long Term Goal: Resolve the core conflicts that are the sources of anxiety.  Short Term goal: Increase her understanding of beliefs and messages that produce anxiety, worry, and uneasiness.  Strategy: Assist patient in becoming aware and exploring key unresolved life conflicts and encourage her work towards resolution.   (Progressing)- Followed through on homework that is  goal-related. Acknowledged some increase positive feelings. Processed homework and she is also using some of her Al-Anon materials that offers some helpful input to this work. She shared some of the Al-Anon info that directly relates to her goals and strategies, which we discussed and it seemed to offer her more hope.  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  Questions herself and self esteem is somewhat fragile, but is working on goal-directed issues with a little more openness. Worked today also on more of the messages that lead to anxiety and worry and was able to identify several. Discussed how some of these beliefs get activated for patient and how she might prevent that in the future. Goal review and some "positives" noted with patient.     Next appt in 2-3 weeks.   Shanon Ace, LCSW

## 2019-08-24 ENCOUNTER — Ambulatory Visit: Payer: Medicare HMO | Admitting: Psychiatry

## 2019-08-26 ENCOUNTER — Ambulatory Visit: Payer: Medicare HMO | Admitting: Psychiatry

## 2019-09-07 ENCOUNTER — Other Ambulatory Visit: Payer: Self-pay

## 2019-09-07 ENCOUNTER — Ambulatory Visit: Payer: Medicare HMO | Admitting: Psychiatry

## 2019-09-07 DIAGNOSIS — R69 Illness, unspecified: Secondary | ICD-10-CM | POA: Diagnosis not present

## 2019-09-07 DIAGNOSIS — F411 Generalized anxiety disorder: Secondary | ICD-10-CM

## 2019-09-07 NOTE — Progress Notes (Signed)
[facility] Counselor/Therapist Progress Note  Patient ID: Desiree Snow, MRN: [id number],    Date: 09/07/2019  Time Spent: 60 minutes   5:00pm to 6:00pm  Treatment Type: Individual Therapy  Reported Symptoms:  anxiety  Mental Status Exam:  Appearance:   Neat     Behavior:  Appropriate and Sharing  Motor:  Normal  Speech/Language:   Normal Rate  Affect:  anxious  Mood:  anxious  Thought process:  normal  Thought content:    WNL  Sensory/Perceptual disturbances:    WNL  Orientation:  oriented to person, place, time/date, situation, day of week, month of year and year  Attention:  Good  Concentration:  Good  Memory:  WNL  Fund of knowledge:   Good  Insight:    Good  Judgment:   Good  Impulse Control:  Fair   Risk Assessment: Danger to Self:  No Self-injurious Behavior: No Danger to Others: No Duty to Warn:no Physical Aggression / Violence:No  Access to Firearms a concern: No  Gang Involvement:No   Subjective:  Patient in today with anxiety, generalized fear.  Reports holiday ok and she went to a friend's house.  Anxiety has been less.    Interventions: Solution-Oriented/Positive Psychology  Diagnosis:   ICD-10-CM   1. GAD (generalized anxiety disorder)  F41.1      Plan: Treatment goals may stay on treatment plan as patient is working on strategies to meet her goals, and progess is noted every session in "Progress" section.   Treatment Goals: Patient not signing tx plan on computer screen due to [place].  Long Term Goal: Resolve the core conflicts that are the sources of anxiety.  Short Term goal: Increase her understanding of beliefs and messages that produce anxiety, worry, and uneasiness.  Strategy: Assist patient in becoming aware and exploring key unresolved life conflicts and encourage her work towards resolution.   (Progressing)-   Patient comes today and reports still having some anxiety, however is also some better.  "Not  sure why really, but I am not as anxious as I had been."  Related to her some of the progress I see in her and that seemed to give patient a boost as she doesn't hear a lot of positives very often.  Lives alone and is contemplating being more open to having a relationship at some point in her future.  She shared more about that and it  may be a very good thing for her.Trust issued also discussed and how this shows up in [place] people for no "apparent reason".  I do think she is trying to address part of her goals, specifically about resolving core conflicts from past in order to move forward. Shared what she feels she is wanting and where there are things she needs to focus on more.  We also discussed another resource in the community that may be of help to her.  (All details not included in note due to patient privacy needs.)  Self esteem issues still present and she is working on them and trying to be more positive with herself and in self-talk.  Encouraged her to push harder in that area, as she works on other goal-directed issues, especially negative/anxious thoughts that lead her to feel and respond in anxious ways.  Looking at the anxious thoughts behind her anxious feelings and trying to change those thoughts to be more positive, reality-based, and empowering. Patient is able to sometimes identify these and needing to work more on the  replacement of those thoughts. Goal review and progress/efforts noted with patient. Patient to follow through on this as homework that can be meaningful for her.  Will call for next appt.   Shanon Ace, LCSW

## 2019-09-14 DIAGNOSIS — G40909 Epilepsy, unspecified, not intractable, without status epilepticus: Secondary | ICD-10-CM | POA: Diagnosis not present

## 2019-09-14 DIAGNOSIS — Z79899 Other long term (current) drug therapy: Secondary | ICD-10-CM | POA: Diagnosis not present

## 2019-09-15 ENCOUNTER — Other Ambulatory Visit: Payer: Self-pay | Admitting: Neurology

## 2019-09-15 ENCOUNTER — Other Ambulatory Visit (HOSPITAL_COMMUNITY): Payer: Self-pay | Admitting: Neurology

## 2019-09-15 DIAGNOSIS — G40909 Epilepsy, unspecified, not intractable, without status epilepticus: Secondary | ICD-10-CM

## 2019-09-17 DIAGNOSIS — M65311 Trigger thumb, right thumb: Secondary | ICD-10-CM | POA: Diagnosis not present

## 2019-09-18 ENCOUNTER — Other Ambulatory Visit: Payer: Self-pay

## 2019-09-18 ENCOUNTER — Ambulatory Visit (HOSPITAL_COMMUNITY)
Admission: RE | Admit: 2019-09-18 | Discharge: 2019-09-18 | Disposition: A | Discharge status: Home / Self Care | Payer: Medicare HMO | Source: Ambulatory Visit | Attending: Neurology | Admitting: Neurology

## 2019-09-18 DIAGNOSIS — G40909 Epilepsy, unspecified, not intractable, without status epilepticus: Secondary | ICD-10-CM | POA: Diagnosis not present

## 2019-09-18 DIAGNOSIS — R569 Unspecified convulsions: Secondary | ICD-10-CM | POA: Diagnosis not present

## 2019-09-18 MED ORDER — GADOBUTROL 1 MMOL/ML IV SOLN
9.0000 mL | Freq: Once | INTRAVENOUS | Status: AC | PRN
  Administered 2019-09-18: 9 mL via INTRAVENOUS

## 2019-09-21 ENCOUNTER — Ambulatory Visit: Payer: Medicare HMO | Admitting: Psychiatry

## 2019-10-09 HISTORY — PX: KNEE SURGERY: SHX244

## 2019-11-16 ENCOUNTER — Ambulatory Visit: Payer: Medicare Other | Attending: Internal Medicine

## 2019-11-16 DIAGNOSIS — Z23 Encounter for immunization: Secondary | ICD-10-CM | POA: Insufficient documentation

## 2019-11-16 NOTE — Progress Notes (Signed)
   Covid-19 Vaccination Clinic  Name:  Desiree Snow    MRN: [id number] DOB: 08-18-1951  11/16/2019  [name] was observed post Covid-19 immunization for 15 minutes without incidence. She was provided with Vaccine Information Sheet and instruction to access the V-Safe system.   [name] was instructed to call 911 with any severe reactions post vaccine: Marland Kitchen Difficulty breathing  . Swelling of your face and throat  . A fast heartbeat  . A bad rash all over your body  . Dizziness and weakness    Immunizations Administered    Name Date Dose VIS Date Route   Pfizer COVID-19 Vaccine 11/16/2019  3:41 PM 0.3 mL 09/18/2019 Intramuscular   Manufacturer: [place]   Lot: [id number]   [place]: [id number]

## 2019-12-11 ENCOUNTER — Other Ambulatory Visit: Payer: Self-pay

## 2019-12-11 ENCOUNTER — Ambulatory Visit: Payer: Medicare Other | Attending: Internal Medicine

## 2019-12-11 DIAGNOSIS — Z23 Encounter for immunization: Secondary | ICD-10-CM

## 2019-12-11 NOTE — Progress Notes (Signed)
   Covid-19 Vaccination Clinic  Name:  Desiree Snow    MRN: [id number] DOB: 28-Mar-1951  12/11/2019  [name] was observed post Covid-19 immunization for 30 minutes based on pre-vaccination screening without incident. She was provided with Vaccine Information Sheet and instruction to access the V-Safe system.   [name] was instructed to call 911 with any severe reactions post vaccine: Marland Kitchen Difficulty breathing  . Swelling of face and throat  . A fast heartbeat  . A bad rash all over body  . Dizziness and weakness   Immunizations Administered    Name Date Dose VIS Date Route   Pfizer COVID-19 Vaccine 12/11/2019 11:38 AM 0.3 mL 09/18/2019 Intramuscular   Manufacturer: [place]   Lot: [id number]   [place]: [id number]

## 2019-12-14 ENCOUNTER — Other Ambulatory Visit: Payer: Self-pay | Admitting: Internal Medicine

## 2019-12-14 DIAGNOSIS — Z1231 Encounter for screening mammogram for malignant neoplasm of breast: Secondary | ICD-10-CM

## 2019-12-18 ENCOUNTER — Ambulatory Visit
Admission: RE | Admit: 2019-12-18 | Discharge: 2019-12-18 | Disposition: A | Discharge status: Home / Self Care | Payer: Medicare Other | Source: Ambulatory Visit | Attending: Internal Medicine | Admitting: Internal Medicine

## 2019-12-18 ENCOUNTER — Other Ambulatory Visit: Payer: Self-pay

## 2019-12-18 DIAGNOSIS — Z1231 Encounter for screening mammogram for malignant neoplasm of breast: Secondary | ICD-10-CM

## 2019-12-21 ENCOUNTER — Other Ambulatory Visit: Payer: Self-pay | Admitting: Internal Medicine

## 2019-12-21 DIAGNOSIS — R928 Other abnormal and inconclusive findings on diagnostic imaging of breast: Secondary | ICD-10-CM

## 2019-12-28 ENCOUNTER — Other Ambulatory Visit: Payer: Self-pay | Admitting: Internal Medicine

## 2019-12-28 ENCOUNTER — Other Ambulatory Visit: Payer: Self-pay

## 2019-12-28 ENCOUNTER — Ambulatory Visit
Admission: RE | Admit: 2019-12-28 | Discharge: 2019-12-28 | Disposition: A | Discharge status: Home / Self Care | Payer: Medicare Other | Source: Ambulatory Visit | Attending: Internal Medicine | Admitting: Internal Medicine

## 2019-12-28 DIAGNOSIS — R928 Other abnormal and inconclusive findings on diagnostic imaging of breast: Secondary | ICD-10-CM

## 2020-01-01 ENCOUNTER — Ambulatory Visit
Admission: RE | Admit: 2020-01-01 | Discharge: 2020-01-01 | Disposition: A | Discharge status: Home / Self Care | Payer: Medicare Other | Source: Ambulatory Visit | Attending: Internal Medicine | Admitting: Internal Medicine

## 2020-01-01 ENCOUNTER — Other Ambulatory Visit: Payer: Self-pay

## 2020-01-01 DIAGNOSIS — R928 Other abnormal and inconclusive findings on diagnostic imaging of breast: Secondary | ICD-10-CM

## 2020-01-01 HISTORY — PX: BREAST BIOPSY: SHX20

## 2020-01-25 ENCOUNTER — Ambulatory Visit: Payer: Self-pay | Admitting: Surgery

## 2020-01-25 DIAGNOSIS — N6489 Other specified disorders of breast: Secondary | ICD-10-CM

## 2020-01-25 NOTE — H&P (Signed)
Desiree Snow Documented: 01/25/2020 9:38 AM Location: [facility] Patient #: [id number] DOB: [birth year] Single / Language: Cleophus Molt / Race: White Female  History of Present Illness Desiree Moores A. Caileigh Canche MD; 01/25/2020 11:01 AM) Patient words: Patient sent at the request of the [facility] [place] due to mammographic distortion noted on follow-up mammogram involving right breast. Area distortion noted in core biopsy showed complex sclerosing lesion. Patient has no complaints today. Denies history of breast mass, nipple discharge or pain in either breast. There is been no change in the appearance of either breast.       Recall from screening mammography with tomosynthesis, possible architectural distortion involving the OUTER RIGHT breast at depth. Personal history of benign excisional biopsy from the RIGHT breast.  EXAM: DIGITAL DIAGNOSTIC RIGHT MAMMOGRAM WITH CAD AND TOMO  ULTRASOUND RIGHT BREAST  COMPARISON: Previous exam(s).  ACR Breast Density Category b: There are scattered areas of fibroglandular density.  FINDINGS: Tomosynthesis and synthesized spot-compression CC and MLO views of the area of concern in the RIGHT breast and a tomosynthesis and synthesized full field tangential view of the RIGHT breast surgical scar were obtained.  The spot compression images demonstrate persistent architectural distortion in the OUTER breast at MIDDLE depth, likely UPPER OUTER QUADRANT. There is no associated mass or suspicious calcifications. The distortion not correspond to the surgical scar which is in the LOWER breast on the tangential view.  The full field tangential image was processed with CAD.  Targeted RIGHT breast ultrasound is performed, showing a hypoechoic mass at the 10 o'clock position approximately 3 cm from the nipple at MIDDLE depth, measuring approximately 3 x 3 x 4 mm, demonstrating posterior acoustic shadowing, associated with subtle  architectural distortion, therefore likely correlating with the mammographic distortion. The hypoechoic mass is approximately 1 cm away from a benign cyst which measures approximately 3 mm.  Sonographic evaluation of the RIGHT axilla demonstrates no pathologic lymphadenopathy.  IMPRESSION: 1. Suspicious 4 mm mass involving the UPPER OUTER QUADRANT of the RIGHT breast at the 10 o'clock position approximately 3 cm from the nipple, likely the sonographic correlate for the architectural distortion identified on screening mammography. 2. No pathologic RIGHT axillary lymphadenopathy.  RECOMMENDATION: Ultrasound-guided core needle biopsy of the RIGHT breast mass.  The ultrasound core needle biopsy procedure was discussed with the patient and her questions were answered. She wishes to proceed and the biopsy has been scheduled at her convenience.  I have discussed the findings and recommendations with the patient.  BI-RADS CATEGORY 4: Suspicious.   Electronically Signed By: Desiree Snow M.D. On: 12/28/2019 10:09   Diagnosis Breast, right, needle core biopsy, 10 o'clock, 3cmfn - COMPLEX SCLEROSING LESION. - SEE COMMENT.  The patient is a 69 year old female.   Past Surgical History (Desiree Snow, [place]; 01/25/2020 9:38 AM) Breast Biopsy Bilateral. Breast Mass; Local Excision Bilateral. Colon Polyp Removal - Colonoscopy Oral Surgery Splenectomy Tonsillectomy  Diagnostic Studies History (Desiree Snow, [place]; 01/25/2020 9:38 AM) Colonoscopy 1-5 years ago Mammogram within last year Pap Smear 1-5 years ago  Allergies (Desiree Snow, [place]; 01/25/2020 9:39 AM) Scopolamine *ANTIEMETICS* Penicillins Quinolones Metronidazole and Related Allergies Reconciled  Medication History (Desiree Snow, [place]; 01/25/2020 9:40 AM) lamoTRIgine (100MG  Tablet, Oral) Active. Calcium (Oral) Specific strength unknown - Active. Cinnamon (Oral) Specific strength  unknown - Active. Multi-Vitamin (Oral) Active. Medications Reconciled  Social History (Desiree Snow, [place]; 01/25/2020 9:38 AM) Alcohol use Occasional alcohol use. Caffeine use Coffee. No drug use Tobacco use Former  smoker.  Family History (Desiree Snow, [place]; 01/25/2020 9:38 AM) Alcohol Abuse Mother. Bleeding disorder Family Members In General. Breast Cancer Family Members In General. Cerebrovascular Accident Family Members In General. Cervical Cancer Family Members In General. Diabetes Mellitus Family Members In General. Heart Disease Family Members In General. Heart disease in female family member before age 39 Hypertension Family Members In General.  Pregnancy / Birth History (Desiree Snow, [place]; 01/25/2020 9:38 AM) Age at menarche 34 years. Age of menopause 88-50 Gravida 0 Para 0  Other Problems (Desiree Snow, [place]; 01/25/2020 9:38 AM) Back Pain Gastroesophageal Reflux Disease Seizure Disorder Transfusion history     Review of Systems (Desiree A. Brown RMA; 01/25/2020 9:38 AM) General Not Present- Appetite Loss, Chills, Fatigue, Fever, Night Sweats, Weight Gain and Weight Loss. Skin Not Present- Change in Wart/Mole, Dryness, Hives, Jaundice, New Lesions, Non-Healing Wounds, Rash and Ulcer. HEENT Present- Seasonal Allergies and Wears glasses/contact lenses. Not Present- Earache, Hearing Loss, Hoarseness, Nose Bleed, Oral Ulcers, Ringing in the Ears, Sinus Pain, Sore Throat, Visual Disturbances and Yellow Eyes. Respiratory Not Present- Bloody sputum, Chronic Cough, Difficulty Breathing, Snoring and Wheezing. Breast Present- Breast Mass. Not Present- Breast Pain, Nipple Discharge and Skin Changes. Cardiovascular Not Present- Chest Pain, Difficulty Breathing Lying Down, Leg Cramps, Palpitations, Rapid Heart Rate, Shortness of Breath and Swelling of Extremities. Gastrointestinal Not Present- Abdominal Pain, Bloating, Bloody Stool, Change in  Bowel Habits, Chronic diarrhea, Constipation, Difficulty Swallowing, Excessive gas, Gets full quickly at meals, Hemorrhoids, Indigestion, Nausea, Rectal Pain and Vomiting. Female Genitourinary Not Present- Frequency, Nocturia, Painful Urination, Pelvic Pain and Urgency. Musculoskeletal Present- Back Pain. Not Present- Joint Pain, Joint Stiffness, Muscle Pain, Muscle Weakness and Swelling of Extremities. Neurological Not Present- Decreased Memory, Fainting, Headaches, Numbness, Seizures, Tingling, Tremor, Trouble walking and Weakness. Psychiatric Not Present- Anxiety, Bipolar, Change in Sleep Pattern, Depression, Fearful and Frequent crying. Endocrine Not Present- Cold Intolerance, Excessive Hunger, Hair Changes, Heat Intolerance, Hot flashes and New Diabetes. Hematology Not Present- Blood Thinners, Easy Bruising, Excessive bleeding, Gland problems, HIV and Persistent Infections.  Vitals (Desiree A. Brown RMA; 01/25/2020 9:40 AM) 01/25/2020 9:40 AM Weight: 220.8 lb Height: 66in Body Surface Area: 2.09 m Body Mass Index: 35.64 kg/m  Temp.: 97.60F  Pulse: 101 (Regular)  BP: 122/82 (Sitting, Left Arm, Standard)        Physical Exam (Zannah Melucci A. Tarri Guilfoil MD; 01/25/2020 11:02 AM)  General Mental Status-Alert. General Appearance-Consistent with stated age. Hydration-Well hydrated. Voice-Normal.  Head and Neck Head-normocephalic, atraumatic with no lesions or palpable masses. Trachea-midline. Thyroid Gland Characteristics - normal size and consistency.  Breast Breast - Left-Symmetric, Non Tender, No Biopsy scars, no Dimpling - Left, No Inflammation, No Lumpectomy scars, No Mastectomy scars, No Peau d' Orange. Breast - Right-Symmetric, Non Tender, No Biopsy scars, no Dimpling - Right, No Inflammation, No Lumpectomy scars, No Mastectomy scars, No Peau d' Orange. Breast Lump-No Palpable Breast Mass.  Neurologic Neurologic evaluation reveals -alert and  oriented x 3 with no impairment of recent or remote memory. Mental Status-Normal.  Musculoskeletal Normal Exam - Left-Upper Extremity Strength Normal and Lower Extremity Strength Normal. Normal Exam - Right-Upper Extremity Strength Normal and Lower Extremity Strength Normal.  Lymphatic Axillary -Note:No evidence of axillary adenopathy bilaterally.     Assessment & Plan (Jayden Kratochvil A. 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 The patient is chosen right breast seemed lumpectomy over the potential risk of malignancy which is in the range of  10% in these situations. Risk of lumpectomy include bleeding, infection, seroma, more surgery, use of seed/wire, wound care, cosmetic deformity and the need for other treatments, death , blood clots, death. Pt agrees to proceed.  total time 45 minutes for exam face to face time review of pathology , mammograms AND CHART  Current Plans You are being scheduled for surgery- Our schedulers will call you.  You should hear from our office's scheduling department within 5 working days about the location, date, and time of surgery. We try to make accommodations for patient's preferences in scheduling surgery, but sometimes the OR schedule or the surgeon's schedule prevents [place] from making those accommodations.  If you have not heard from our office [phone]) in 5 working days, call the office and ask for your surgeon's nurse.  If you have other questions about your diagnosis, plan, or surgery, call the office and ask for your surgeon's nurse.  Pt Education - [facility] Breast Biopsy HCI: discussed with patient and provided information

## 2020-02-04 ENCOUNTER — Other Ambulatory Visit: Payer: Self-pay | Admitting: Surgery

## 2020-02-04 DIAGNOSIS — N6489 Other specified disorders of breast: Secondary | ICD-10-CM

## 2020-02-22 ENCOUNTER — Encounter (HOSPITAL_BASED_OUTPATIENT_CLINIC_OR_DEPARTMENT_OTHER): Payer: Self-pay | Admitting: Surgery

## 2020-02-22 ENCOUNTER — Other Ambulatory Visit: Payer: Self-pay

## 2020-02-26 ENCOUNTER — Encounter (HOSPITAL_BASED_OUTPATIENT_CLINIC_OR_DEPARTMENT_OTHER)
Admission: RE | Admit: 2020-02-26 | Discharge: 2020-02-26 | Disposition: A | Discharge status: Home / Self Care | Payer: Medicare Other | Source: Ambulatory Visit | Attending: Surgery | Admitting: Surgery

## 2020-02-26 ENCOUNTER — Other Ambulatory Visit (HOSPITAL_COMMUNITY)
Admission: RE | Admit: 2020-02-26 | Discharge: 2020-02-26 | Disposition: A | Discharge status: Home / Self Care | Payer: Medicare Other | Source: Ambulatory Visit | Attending: Surgery | Admitting: Surgery

## 2020-02-26 DIAGNOSIS — Z01812 Encounter for preprocedural laboratory examination: Secondary | ICD-10-CM | POA: Diagnosis present

## 2020-02-26 DIAGNOSIS — Z20822 Contact with and (suspected) exposure to covid-19: Secondary | ICD-10-CM | POA: Insufficient documentation

## 2020-02-26 LAB — COMPREHENSIVE METABOLIC PANEL
ALT: 19 U/L (ref 0–44)
AST: 19 U/L (ref 15–41)
Albumin: 3.5 g/dL (ref 3.5–5.0)
Alkaline Phosphatase: 63 U/L (ref 38–126)
Anion gap: 10 (ref 5–15)
BUN: 15 mg/dL (ref 8–23)
CO2: 24 mmol/L (ref 22–32)
Calcium: 9.2 mg/dL (ref 8.9–10.3)
Chloride: 105 mmol/L (ref 98–111)
Creatinine, Ser: 1.06 mg/dL — ABNORMAL HIGH (ref 0.44–1.00)
GFR calc Af Amer: >60 mL/min (ref >60)
GFR calc non Af Amer: 54 mL/min — ABNORMAL LOW (ref >60)
Glucose, Bld: 128 mg/dL — ABNORMAL HIGH (ref 70–99)
Potassium: 4 mmol/L (ref 3.5–5.1)
Sodium: 139 mmol/L (ref 135–145)
Total Bilirubin: 0.8 mg/dL (ref 0.3–1.2)
Total Protein: 6.8 g/dL (ref 6.5–8.1)

## 2020-02-26 LAB — CBC WITH DIFFERENTIAL/PLATELET
Abs Immature Granulocytes: 0.04 10*3/uL (ref 0.00–0.07)
Basophils Absolute: 0.1 10*3/uL (ref 0.0–0.1)
Basophils Relative: 1 %
Eosinophils Absolute: 0.2 10*3/uL (ref 0.0–0.5)
Eosinophils Relative: 3 %
HCT: 40.8 % (ref 36.0–46.0)
Hemoglobin: 13.4 g/dL (ref 12.0–15.0)
Immature Granulocytes: 1 %
Lymphocytes Relative: 33 %
Lymphs Abs: 2.9 10*3/uL (ref 0.7–4.0)
MCH: 31.2 pg (ref 26.0–34.0)
MCHC: 32.8 g/dL (ref 30.0–36.0)
MCV: 94.9 fL (ref 80.0–100.0)
Monocytes Absolute: 0.9 10*3/uL (ref 0.1–1.0)
Monocytes Relative: 10 %
Neutro Abs: 4.7 10*3/uL (ref 1.7–7.7)
Neutrophils Relative %: 52 %
Platelets: 322 10*3/uL (ref 150–400)
RBC: 4.3 MIL/uL (ref 3.87–5.11)
RDW: 12.7 % (ref 11.5–15.5)
WBC: 8.8 10*3/uL (ref 4.0–10.5)
nRBC: 0 % (ref 0.0–0.2)

## 2020-02-26 LAB — SARS CORONAVIRUS 2 (TAT 6-24 HRS): SARS Coronavirus 2: NEGATIVE

## 2020-02-26 NOTE — Progress Notes (Signed)

## 2020-02-29 ENCOUNTER — Other Ambulatory Visit: Payer: Self-pay

## 2020-02-29 ENCOUNTER — Ambulatory Visit
Admission: RE | Admit: 2020-02-29 | Discharge: 2020-02-29 | Disposition: A | Discharge status: Home / Self Care | Payer: Medicare Other | Source: Ambulatory Visit | Attending: Surgery | Admitting: Surgery

## 2020-02-29 DIAGNOSIS — N6489 Other specified disorders of breast: Secondary | ICD-10-CM

## 2020-03-01 ENCOUNTER — Other Ambulatory Visit: Payer: Self-pay

## 2020-03-01 ENCOUNTER — Ambulatory Visit (HOSPITAL_BASED_OUTPATIENT_CLINIC_OR_DEPARTMENT_OTHER): Payer: Medicare Other | Admitting: Anesthesiology

## 2020-03-01 ENCOUNTER — Encounter (HOSPITAL_BASED_OUTPATIENT_CLINIC_OR_DEPARTMENT_OTHER): Payer: Self-pay | Admitting: Surgery

## 2020-03-01 ENCOUNTER — Encounter (HOSPITAL_BASED_OUTPATIENT_CLINIC_OR_DEPARTMENT_OTHER)
Admission: RE | Disposition: A | Discharge status: Home / Self Care | Payer: Self-pay | Source: Ambulatory Visit | Attending: Surgery

## 2020-03-01 ENCOUNTER — Ambulatory Visit
Admission: RE | Admit: 2020-03-01 | Discharge: 2020-03-01 | Disposition: A | Discharge status: Home / Self Care | Payer: Medicare Other | Source: Ambulatory Visit | Attending: Surgery | Admitting: Surgery

## 2020-03-01 ENCOUNTER — Ambulatory Visit (HOSPITAL_BASED_OUTPATIENT_CLINIC_OR_DEPARTMENT_OTHER)
Admission: RE | Admit: 2020-03-01 | Discharge: 2020-03-01 | Disposition: A | Discharge status: Home / Self Care | Payer: Medicare Other | Source: Ambulatory Visit | Attending: Surgery | Admitting: Surgery

## 2020-03-01 DIAGNOSIS — Z9081 Acquired absence of spleen: Secondary | ICD-10-CM | POA: Diagnosis not present

## 2020-03-01 DIAGNOSIS — Z880 Allergy status to penicillin: Secondary | ICD-10-CM | POA: Insufficient documentation

## 2020-03-01 DIAGNOSIS — G40909 Epilepsy, unspecified, not intractable, without status epilepticus: Secondary | ICD-10-CM | POA: Diagnosis not present

## 2020-03-01 DIAGNOSIS — Z881 Allergy status to other antibiotic agents status: Secondary | ICD-10-CM | POA: Diagnosis not present

## 2020-03-01 DIAGNOSIS — Z79899 Other long term (current) drug therapy: Secondary | ICD-10-CM | POA: Diagnosis not present

## 2020-03-01 DIAGNOSIS — N6091 Unspecified benign mammary dysplasia of right breast: Secondary | ICD-10-CM | POA: Insufficient documentation

## 2020-03-01 DIAGNOSIS — Z832 Family history of diseases of the blood and blood-forming organs and certain disorders involving the immune mechanism: Secondary | ICD-10-CM | POA: Diagnosis not present

## 2020-03-01 DIAGNOSIS — Z803 Family history of malignant neoplasm of breast: Secondary | ICD-10-CM | POA: Insufficient documentation

## 2020-03-01 DIAGNOSIS — N6489 Other specified disorders of breast: Secondary | ICD-10-CM

## 2020-03-01 DIAGNOSIS — K219 Gastro-esophageal reflux disease without esophagitis: Secondary | ICD-10-CM | POA: Diagnosis not present

## 2020-03-01 DIAGNOSIS — Z87891 Personal history of nicotine dependence: Secondary | ICD-10-CM | POA: Diagnosis not present

## 2020-03-01 DIAGNOSIS — Z86010 Personal history of colonic polyps: Secondary | ICD-10-CM | POA: Insufficient documentation

## 2020-03-01 DIAGNOSIS — Z811 Family history of alcohol abuse and dependence: Secondary | ICD-10-CM | POA: Diagnosis not present

## 2020-03-01 DIAGNOSIS — Z823 Family history of stroke: Secondary | ICD-10-CM | POA: Insufficient documentation

## 2020-03-01 DIAGNOSIS — Z8249 Family history of ischemic heart disease and other diseases of the circulatory system: Secondary | ICD-10-CM | POA: Insufficient documentation

## 2020-03-01 DIAGNOSIS — Z888 Allergy status to other drugs, medicaments and biological substances status: Secondary | ICD-10-CM | POA: Diagnosis not present

## 2020-03-01 DIAGNOSIS — N6311 Unspecified lump in the right breast, upper outer quadrant: Secondary | ICD-10-CM | POA: Diagnosis present

## 2020-03-01 HISTORY — PX: BREAST EXCISIONAL BIOPSY: SUR124

## 2020-03-01 HISTORY — PX: BREAST LUMPECTOMY WITH RADIOACTIVE SEED LOCALIZATION: SHX6424

## 2020-03-01 SURGERY — BREAST LUMPECTOMY WITH RADIOACTIVE SEED LOCALIZATION
Anesthesia: General | Site: Breast | Laterality: Right

## 2020-03-01 MED ORDER — BUPIVACAINE HCL (PF) 0.25 % IJ SOLN
INTRAMUSCULAR | Status: DC | PRN
  Administered 2020-03-01: 20 mL

## 2020-03-01 MED ORDER — FENTANYL CITRATE (PF) 100 MCG/2ML IJ SOLN
INTRAMUSCULAR | Status: DC | PRN
  Administered 2020-03-01: 50 ug via INTRAVENOUS
  Administered 2020-03-01 (×2): 25 ug via INTRAVENOUS

## 2020-03-01 MED ORDER — PROPOFOL 10 MG/ML IV BOLUS
INTRAVENOUS | Status: DC | PRN
  Administered 2020-03-01: 200 mg via INTRAVENOUS

## 2020-03-01 MED ORDER — CHLORHEXIDINE GLUCONATE CLOTH 2 % EX PADS: 6.0000 | MEDICATED_PAD | Freq: Once | CUTANEOUS | Status: DC

## 2020-03-01 MED ORDER — EPHEDRINE 5 MG/ML INJ
INTRAVENOUS | Status: AC
  Filled 2020-03-01: qty 10

## 2020-03-01 MED ORDER — EPHEDRINE SULFATE-NACL 50-0.9 MG/10ML-% IV SOSY
PREFILLED_SYRINGE | INTRAVENOUS | Status: DC | PRN
  Administered 2020-03-01 (×2): 5 mg via INTRAVENOUS

## 2020-03-01 MED ORDER — ACETAMINOPHEN 500 MG PO TABS
1000.0000 mg | ORAL_TABLET | ORAL | Status: AC
  Administered 2020-03-01: 1000 mg via ORAL

## 2020-03-01 MED ORDER — LACTATED RINGERS IV SOLN: INTRAVENOUS | Status: DC

## 2020-03-01 MED ORDER — DEXAMETHASONE SODIUM PHOSPHATE 10 MG/ML IJ SOLN
INTRAMUSCULAR | Status: DC | PRN
  Administered 2020-03-01: 5 mg via INTRAVENOUS

## 2020-03-01 MED ORDER — CLINDAMYCIN PHOSPHATE 900 MG/50ML IV SOLN
900.0000 mg | INTRAVENOUS | Status: AC
  Administered 2020-03-01: 900 mg via INTRAVENOUS

## 2020-03-01 MED ORDER — CLINDAMYCIN PHOSPHATE 900 MG/50ML IV SOLN
INTRAVENOUS | Status: AC
  Filled 2020-03-01: qty 50

## 2020-03-01 MED ORDER — FENTANYL CITRATE (PF) 100 MCG/2ML IJ SOLN
INTRAMUSCULAR | Status: AC
  Filled 2020-03-01: qty 2

## 2020-03-01 MED ORDER — HYDROCODONE-ACETAMINOPHEN 5-325 MG PO TABS
1.0000 | ORAL_TABLET | Freq: Four times a day (QID) | ORAL | 0 refills | Status: DC | PRN
Start: 2020-03-01 — End: 2020-07-12

## 2020-03-01 MED ORDER — ACETAMINOPHEN 500 MG PO TABS
ORAL_TABLET | ORAL | Status: AC
  Filled 2020-03-01: qty 2

## 2020-03-01 MED ORDER — MIDAZOLAM HCL 2 MG/2ML IJ SOLN: 1.0000 mg | INTRAMUSCULAR | Status: DC | PRN

## 2020-03-01 MED ORDER — IBUPROFEN 800 MG PO TABS
800.0000 mg | ORAL_TABLET | Freq: Three times a day (TID) | ORAL | 0 refills | Status: DC | PRN
Start: 2020-03-01 — End: 2020-07-12

## 2020-03-01 MED ORDER — LIDOCAINE HCL (CARDIAC) PF 100 MG/5ML IV SOSY
PREFILLED_SYRINGE | INTRAVENOUS | Status: DC | PRN
  Administered 2020-03-01: 100 mg via INTRAVENOUS

## 2020-03-01 MED ORDER — FENTANYL CITRATE (PF) 100 MCG/2ML IJ SOLN: 50.0000 ug | INTRAMUSCULAR | Status: DC | PRN

## 2020-03-01 MED ORDER — ONDANSETRON HCL 4 MG/2ML IJ SOLN
INTRAMUSCULAR | Status: DC | PRN
  Administered 2020-03-01: 4 mg via INTRAVENOUS

## 2020-03-01 SURGICAL SUPPLY — 52 items
APPLIER CLIP 9.375 MED OPEN (MISCELLANEOUS)
BINDER BREAST LRG (GAUZE/BANDAGES/DRESSINGS) IMPLANT
BINDER BREAST MEDIUM (GAUZE/BANDAGES/DRESSINGS) IMPLANT
BINDER BREAST XLRG (GAUZE/BANDAGES/DRESSINGS) ×1 IMPLANT
BINDER BREAST XXLRG (GAUZE/BANDAGES/DRESSINGS) IMPLANT
BLADE SURG 15 STRL LF DISP TIS (BLADE) ×1 IMPLANT
BLADE SURG 15 STRL SS (BLADE) ×1
CANISTER SUC SOCK COL 7IN (MISCELLANEOUS) IMPLANT
CANISTER SUCT 1200ML W/VALVE (MISCELLANEOUS) IMPLANT
CHLORAPREP W/TINT 26 (MISCELLANEOUS) ×2 IMPLANT
CLIP APPLIE 9.375 MED OPEN (MISCELLANEOUS) IMPLANT
COVER BACK TABLE 60X90IN (DRAPES) ×2 IMPLANT
COVER MAYO STAND STRL (DRAPES) ×2 IMPLANT
COVER PROBE W GEL 5X96 (DRAPES) ×2 IMPLANT
COVER WAND RF STERILE (DRAPES) IMPLANT
DECANTER SPIKE VIAL GLASS SM (MISCELLANEOUS) IMPLANT
DERMABOND ADVANCED (GAUZE/BANDAGES/DRESSINGS) ×1
DERMABOND ADVANCED .7 DNX12 (GAUZE/BANDAGES/DRESSINGS) ×1 IMPLANT
DRAPE LAPAROSCOPIC ABDOMINAL (DRAPES) IMPLANT
DRAPE LAPAROTOMY 100X72 PEDS (DRAPES) ×2 IMPLANT
DRAPE UTILITY XL STRL (DRAPES) ×2 IMPLANT
ELECT COATED BLADE 2.86 ST (ELECTRODE) ×2 IMPLANT
ELECT REM PT RETURN 9FT ADLT (ELECTROSURGICAL) ×2
ELECTRODE REM PT RTRN 9FT ADLT (ELECTROSURGICAL) ×1 IMPLANT
GLOVE BIO SURGEON STRL SZ7 (GLOVE) ×1 IMPLANT
GLOVE BIOGEL PI IND STRL 7.0 (GLOVE) IMPLANT
GLOVE BIOGEL PI IND STRL 7.5 (GLOVE) IMPLANT
GLOVE BIOGEL PI IND STRL 8 (GLOVE) ×1 IMPLANT
GLOVE BIOGEL PI INDICATOR 7.0 (GLOVE) ×1
GLOVE BIOGEL PI INDICATOR 7.5 (GLOVE) ×1
GLOVE BIOGEL PI INDICATOR 8 (GLOVE) ×1
GLOVE ECLIPSE 8.0 STRL XLNG CF (GLOVE) ×2 IMPLANT
GOWN STRL REUS W/ TWL LRG LVL3 (GOWN DISPOSABLE) ×2 IMPLANT
GOWN STRL REUS W/TWL LRG LVL3 (GOWN DISPOSABLE) ×2
HEMOSTAT ARISTA ABSORB 3G PWDR (HEMOSTASIS) IMPLANT
HEMOSTAT SNOW SURGICEL 2X4 (HEMOSTASIS) IMPLANT
KIT MARKER MARGIN INK (KITS) ×2 IMPLANT
NDL HYPO 25X1 1.5 SAFETY (NEEDLE) ×1 IMPLANT
NEEDLE HYPO 25X1 1.5 SAFETY (NEEDLE) ×2 IMPLANT
NS IRRIG 1000ML POUR BTL (IV SOLUTION) ×2 IMPLANT
PENCIL SMOKE EVACUATOR (MISCELLANEOUS) ×2 IMPLANT
SET BASIN DAY SURGERY F.S. (CUSTOM PROCEDURE TRAY) ×2 IMPLANT
SLEEVE SCD COMPRESS KNEE MED (MISCELLANEOUS) ×2 IMPLANT
SPONGE LAP 4X18 RFD (DISPOSABLE) ×2 IMPLANT
SUT MNCRL AB 4-0 PS2 18 (SUTURE) ×2 IMPLANT
SUT SILK 2 0 SH (SUTURE) IMPLANT
SUT VICRYL 3-0 CR8 SH (SUTURE) ×2 IMPLANT
SYR CONTROL 10ML LL (SYRINGE) ×2 IMPLANT
TOWEL GREEN STERILE FF (TOWEL DISPOSABLE) ×2 IMPLANT
TRAY FAXITRON CT DISP (TRAY / TRAY PROCEDURE) ×2 IMPLANT
TUBE CONNECTING 20X1/4 (TUBING) IMPLANT
YANKAUER SUCT BULB TIP NO VENT (SUCTIONS) IMPLANT

## 2020-03-01 NOTE — H&P (Signed)
[place]   Location: [facility]  Patient #: [id number]  DOB: [birth year]  Single / Language: Cleophus Molt / Race: White  Female  History of Present Illness Patient words: Patient sent at the request of the [facility] [place] due to mammographic distortion noted on follow-up mammogram involving right breast. Area distortion noted in core biopsy showed complex sclerosing lesion. Patient has no complaints today. Denies history of breast mass, nipple discharge or pain in either breast. There is been no change in the appearance of either breast.  Recall from screening mammography with  tomosynthesis, possible architectural distortion involving the OUTER  RIGHT breast at depth. Personal history of benign excisional biopsy  from the RIGHT breast.  EXAM:  DIGITAL DIAGNOSTIC RIGHT MAMMOGRAM WITH CAD AND TOMO  ULTRASOUND RIGHT BREAST  COMPARISON: Previous exam(s).  ACR Breast Density Category b: There are scattered areas of  fibroglandular density.  FINDINGS:  Tomosynthesis and synthesized spot-compression CC and MLO views of  the area of concern in the RIGHT breast and a tomosynthesis and  synthesized full field tangential view of the RIGHT breast surgical  scar were obtained.  The spot compression images demonstrate persistent architectural  distortion in the OUTER breast at MIDDLE depth, likely UPPER OUTER  QUADRANT. There is no associated mass or suspicious calcifications.  The distortion not correspond to the surgical scar which is in the  LOWER breast on the tangential view.  The full field tangential image was processed with CAD.  Targeted RIGHT breast ultrasound is performed, showing a hypoechoic  mass at the 10 o'clock position approximately 3 cm from the nipple  at MIDDLE depth, measuring approximately 3 x 3 x 4 mm, demonstrating  posterior acoustic shadowing, associated with subtle architectural  distortion, therefore likely correlating with the mammographic   distortion. The hypoechoic mass is approximately 1 cm away from a  benign cyst which measures approximately 3 mm.  Sonographic evaluation of the RIGHT axilla demonstrates no  pathologic lymphadenopathy.  IMPRESSION:  1. Suspicious 4 mm mass involving the UPPER OUTER QUADRANT of the  RIGHT breast at the 10 o'clock position approximately 3 cm from the  nipple, likely the sonographic correlate for the architectural  distortion identified on screening mammography.  2. No pathologic RIGHT axillary lymphadenopathy.  RECOMMENDATION:  Ultrasound-guided core needle biopsy of the RIGHT breast mass.  The ultrasound core needle biopsy procedure was discussed with the  patient and her questions were answered. She wishes to proceed and  the biopsy has been scheduled at her convenience.  I have discussed the findings and recommendations with the patient.  BI-RADS CATEGORY 4: Suspicious.  Electronically Signed  By: Evangeline Dakin M.D.  On: 12/28/2019 10:09  Diagnosis  Breast, right, needle core biopsy, 10 o'clock, 3cmfn  - COMPLEX SCLEROSING LESION.  - SEE COMMENT.  The patient is a 69 year old female.  Past Surgical History Breast Biopsy Bilateral.  Breast Mass; Local Excision Bilateral.  Colon Polyp Removal - Colonoscopy  Oral Surgery  Splenectomy  Tonsillectomy  Diagnostic Studies History AM)  Colonoscopy 1-5 years ago  Mammogram within last year  Pap Smear 1-5 years ago  Allergies Scopolamine *ANTIEMETICS*  Penicillins  Quinolones  Metronidazole and Related  Allergies Reconciled  Medication History  lamoTRIgine (100MG  Tablet, Oral) Active.  Calcium (Oral) Specific strength unknown - Active.  Cinnamon (Oral) Specific strength unknown - Active.  Multi-Vitamin (Oral) Active.  Medications Reconciled  Social History Alcohol use Occasional alcohol use.  Caffeine use Coffee.  No drug  use  Tobacco use Former smoker.  Family History Alcohol Abuse Mother.  Bleeding disorder Family  Members In General.  Breast Cancer Family Members In General.  Cerebrovascular Accident Family Members In General.  Cervical Cancer Family Members In General.  Diabetes Mellitus Family Members In General.  Heart Disease Family Members In General.  Heart disease in female family member before age 52  Hypertension Family Members In General.  Pregnancy / Birth History   Age at menarche 49 years.  Age of menopause 57-50  Gravida 0  Para 0  Other Problems Back Pain  Gastroesophageal Reflux Disease  Seizure Disorder  Transfusion history  Review of Systems General Not Present- Appetite Loss, Chills, Fatigue, Fever, Night Sweats, Weight Gain and Weight Loss.  Skin Not Present- Change in Wart/Mole, Dryness, Hives, Jaundice, New Lesions, Non-Healing Wounds, Rash and Ulcer.  HEENT Present- Seasonal Allergies and Wears glasses/contact lenses. Not Present- Earache, Hearing Loss, Hoarseness, Nose Bleed, Oral Ulcers, Ringing in the Ears, Sinus Pain, Sore Throat, Visual Disturbances and Yellow Eyes.  Respiratory Not Present- Bloody sputum, Chronic Cough, Difficulty Breathing, Snoring and Wheezing.  Breast Present- Breast Mass. Not Present- Breast Pain, Nipple Discharge and Skin Changes.  Cardiovascular Not Present- Chest Pain, Difficulty Breathing Lying Down, Leg Cramps, Palpitations, Rapid Heart Rate, Shortness of Breath and Swelling of Extremities.  Gastrointestinal Not Present- Abdominal Pain, Bloating, Bloody Stool, Change in Bowel Habits, Chronic diarrhea, Constipation, Difficulty Swallowing, Excessive gas, Gets full quickly at meals, Hemorrhoids, Indigestion, Nausea, Rectal Pain and Vomiting.  Female Genitourinary Not Present- Frequency, Nocturia, Painful Urination, Pelvic Pain and Urgency.  Musculoskeletal Present- Back Pain. Not Present- Joint Pain, Joint Stiffness, Muscle Pain, Muscle Weakness and Swelling of Extremities.  Neurological Not Present- Decreased Memory, Fainting, Headaches,  Numbness, Seizures, Tingling, Tremor, Trouble walking and Weakness.  Psychiatric Not Present- Anxiety, Bipolar, Change in Sleep Pattern, Depression, Fearful and Frequent crying.  Endocrine Not Present- Cold Intolerance, Excessive Hunger, Hair Changes, Heat Intolerance, Hot flashes and New Diabetes.  Hematology Not Present- Blood Thinners, Easy Bruising, Excessive bleeding, Gland problems, HIV and Persistent Infections.  Vitals 01/25/2020 9:40 AM  Weight: 220.8 lb Height: 66 in  Body Surface Area: 2.09 m Body Mass Index: 35.64 kg/m  Temp.: 97.3 F Pulse: 101 (Regular)  BP: 122/82 (Sitting, Left Arm, Standard)  Physical Exam (Arbadella Kimbler A. Thena Devora MD; 01/25/2020 11:02 AM)  General  Mental Status - Alert.  General Appearance - Consistent with stated age.  Hydration - Well hydrated.  Voice - Normal.  Head and Neck  Head - normocephalic, atraumatic with no lesions or palpable masses.  Trachea - midline.  Thyroid  Gland Characteristics - normal size and consistency.  Breast  Breast - Left - Symmetric, Non Tender, No Biopsy scars, no Dimpling - Left, No Inflammation, No Lumpectomy scars, No Mastectomy scars, No Peau d' Orange.  Breast - Right - Symmetric, Non Tender, No Biopsy scars, no Dimpling - Right, No Inflammation, No Lumpectomy scars, No Mastectomy scars, No Peau d' Orange.  Breast Lump - No Palpable Breast Mass.  Neurologic  Neurologic evaluation reveals - alert and oriented x 3 with no impairment of recent or remote memory.  Mental Status - Normal.  Musculoskeletal  Normal Exam - Left - Upper Extremity Strength Normal and Lower Extremity Strength Normal.  Normal Exam - Right - Upper Extremity Strength Normal and Lower Extremity Strength Normal.  Lymphatic  Axillary - Note: No evidence of axillary adenopathy bilaterally.  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 The patient is chosen right breast seemed lumpectomy  over the potential risk of malignancy which is in the range of 10% in these situations. Risk of lumpectomy include bleeding, infection, seroma, more surgery, use of seed/wire, wound care, cosmetic deformity and the need for other treatments, death , blood clots, death. Pt agrees to proceed.  total time 45 minutes for exam face to face time review of pathology , mammograms AND CHART  Current Plans  You are being scheduled for surgery - Our schedulers will call you.  You should hear from our office's scheduling department within 5 working days about the location, date, and time of surgery. We try to make accommodations for patient's preferences in scheduling surgery, but sometimes the OR schedule or the surgeon's schedule prevents [place] from making those accommodations.  If you have not heard from our office [phone]) in 5 working days, call the office and ask for your surgeon's nurse.  If you have other questions about your diagnosis, plan, or surgery, call the office and ask for your surgeon's nurse.  Pt Education - [facility] Breast Biopsy HCI: discussed with patient and provided information

## 2020-03-01 NOTE — Op Note (Signed)
Preoperative diagnosis: Right breast complex sclerosing lesion  Postoperative diagnosis: Same  Procedure: Right breast seed localized lumpectomy  Surgeon: Erroll Luna, MD  Anesthesia: LMA with 0.25% Marcaine plain  EBL: Minimal  Specimen: Right breast tissue with seed and clip verified by Faxitron  Drains: None  Indications for procedure: The patient is a 69 year old female with a mammographic abnormality noted.  Core biopsy showed a complex worrisome lesion.  We met to discuss the significance of these lesions and the potential upgrade risk of up to 10% to malignancy.  We discussed observation.  She opted for right breast lumpectomy.The procedure has been discussed with the patient. Alternatives to surgery have been discussed with the patient.  Risks of surgery include bleeding,  Infection,  Seroma formation, death,  and the need for further surgery.   The patient understands and wishes to proceed.   Description of procedure: The patient was met in the holding area and questions were answered.  Neoprobe was used to verify seed location right breast upper outer quadrant.  All questions were answered.  She taken back to the operative room.  She is placed supine upon the OR table.  After induction of general esthesia the right breast was prepped and draped in sterile fashion timeout performed.  Proper patient, site and procedure were verified.  Curvilinear incision was made upper quadrant after infiltration of local anesthetic.  All tissue around the seed and clip were excised with a grossly negative margin.  Hemostasis achieved.  Wound closed with 3-0 Vicryl and 4 Monocryl.  Faxitron revealed seed and clip to be in the specimen and this was sent to pathology after verification.  Dermabond applied.  All counts found to be correct.  The patient was awoke extubated taken to recovery in satisfactory condition.

## 2020-03-01 NOTE — Anesthesia Procedure Notes (Signed)
Procedure Name: LMA Insertion Date/Time: 03/01/2020 3:20 PM Performed by: Raenette Rover, CRNA Pre-anesthesia Checklist: Patient identified, Emergency Drugs available, Suction available and Patient being monitored Patient Re-evaluated:Patient Re-evaluated prior to induction Oxygen Delivery Method: Circle system utilized Preoxygenation: Pre-oxygenation with 100% oxygen Induction Type: IV induction LMA: LMA inserted LMA Size: 4.0 Number of attempts: 1 Placement Confirmation: positive ETCO2 and breath sounds checked- equal and bilateral Tube secured with: Tape Dental Injury: Teeth and Oropharynx as per pre-operative assessment

## 2020-03-01 NOTE — Interval H&P Note (Signed)
History and Physical Interval Note:  03/01/2020 1:40 PM  Desiree Snow  has presented today for surgery, with the diagnosis of [place].  The various methods of treatment have been discussed with the patient and family. After consideration of risks, benefits and other options for treatment, the patient has consented to  Procedure(s): RIGHT BREAST LUMPECTOMY WITH RADIOACTIVE SEED LOCALIZATION (Right) as a surgical intervention.  The patient's history has been reviewed, patient examined, no change in status, stable for surgery.  I have reviewed the patient's chart and labs.  Questions were answered to the patient's satisfaction.     [place]

## 2020-03-01 NOTE — Discharge Instructions (Signed)
°Post Anesthesia Home Care Instructions ° °Activity: °Get plenty of rest for the remainder of the day. A responsible individual must stay with you for 24 hours following the procedure.  °For the next 24 hours, DO NOT: °-Drive a car °-Operate machinery °-Drink alcoholic beverages °-Take any medication unless instructed by your physician °-Make any legal decisions or sign important papers. ° °Meals: °Start with liquid foods such as gelatin or soup. Progress to regular foods as tolerated. Avoid greasy, spicy, heavy foods. If nausea and/or vomiting occur, drink only clear liquids until the nausea and/or vomiting subsides. Call your physician if vomiting continues. ° °Special Instructions/Symptoms: °Your throat may feel dry or sore from the anesthesia or the breathing tube placed in your throat during surgery. If this causes discomfort, gargle with warm salt water. The discomfort should disappear within 24 hours. ° °If you had a scopolamine patch placed behind your ear for the management of post- operative nausea and/or vomiting: ° °1. The medication in the patch is effective for 72 hours, after which it should be removed.  Wrap patch in a tissue and discard in the trash. Wash hands thoroughly with soap and water. °2. You may remove the patch earlier than 72 hours if you experience unpleasant side effects which may include dry mouth, dizziness or visual disturbances. °3. Avoid touching the patch. Wash your hands with soap and water after contact with the patch. °  ° ° ° ° °[facility] °Office Phone Number [phone] ° °BREAST BIOPSY/ PARTIAL MASTECTOMY: POST OP INSTRUCTIONS ° °Always review your discharge instruction sheet given to you by the facility where your surgery was performed. ° °IF YOU HAVE DISABILITY OR FAMILY LEAVE FORMS, YOU MUST BRING THEM TO THE OFFICE FOR PROCESSING.  DO NOT GIVE THEM TO YOUR DOCTOR. ° °1. A prescription for pain medication may be given to you upon discharge.  Take your  pain medication as prescribed, if needed.  If narcotic pain medicine is not needed, then you may take acetaminophen (Tylenol) or ibuprofen (Advil) as needed. °2. Take your usually prescribed medications unless otherwise directed °3. If you need a refill on your pain medication, please contact your pharmacy.  They will contact our office to request authorization.  Prescriptions will not be filled after 5pm or on week-ends. °4. You should eat very light the first 24 hours after surgery, such as soup, crackers, pudding, etc.  Resume your normal diet the day after surgery. °5. Most patients will experience some swelling and bruising in the breast.  Ice packs and a good support bra will help.  Swelling and bruising can take several days to resolve.  °6. It is common to experience some constipation if taking pain medication after surgery.  Increasing fluid intake and taking a stool softener will usually help or prevent this problem from occurring.  A mild laxative (Milk of Magnesia or Miralax) should be taken according to package directions if there are no bowel movements after 48 hours. °7. Unless discharge instructions indicate otherwise, you may remove your bandages 24-48 hours after surgery, and you may shower at that time.  You may have steri-strips (small skin tapes) in place directly over the incision.  These strips should be left on the skin for 7-10 days.  If your surgeon used skin glue on the incision, you may shower in 24 hours.  The glue will flake off over the next 2-3 weeks.  Any sutures or staples will be removed at the office during your follow-up visit. °  8. ACTIVITIES:  You may resume regular daily activities (gradually increasing) beginning the next day.  Wearing a good support bra or sports bra minimizes pain and swelling.  You may have sexual intercourse when it is comfortable. °a. You may drive when you no longer are taking prescription pain medication, you can comfortably wear a seatbelt, and you can  safely maneuver your car and apply brakes. °b. RETURN TO WORK:  ______________________________________________________________________________________ °9. You should see your doctor in the office for a follow-up appointment approximately two weeks after your surgery.  Your doctor’s nurse will typically make your follow-up appointment when she calls you with your pathology report.  Expect your pathology report 2-3 business days after your surgery.  You may call to check if you do not hear from us after three days. °10. OTHER INSTRUCTIONS: _______________________________________________________________________________________________ _____________________________________________________________________________________________________________________________________ °_____________________________________________________________________________________________________________________________________ °_____________________________________________________________________________________________________________________________________ ° °WHEN TO CALL YOUR DOCTOR: °1. Fever over 101.0 °2. Nausea and/or vomiting. °3. Extreme swelling or bruising. °4. Continued bleeding from incision. °5. Increased pain, redness, or drainage from the incision. ° °The clinic staff is available to answer your questions during regular business hours.  Please don’t hesitate to call and ask to speak to one of the nurses for clinical concerns.  If you have a medical emergency, go to the nearest emergency room or call 911.  A surgeon from [facility] is always on call at the hospital. ° °For further questions, please visit [email or web address]  °

## 2020-03-01 NOTE — Anesthesia Preprocedure Evaluation (Signed)
Anesthesia Evaluation  Patient identified by MRN, date of birth, ID band Patient awake    Reviewed: Allergy & Precautions, NPO status , Patient's Chart, lab work & pertinent test results  Airway Mallampati: II  TM Distance: >3 FB Neck ROM: Full    Dental no notable dental hx.    Pulmonary neg pulmonary ROS, former smoker,    Pulmonary exam normal breath sounds clear to auscultation       Cardiovascular negative cardio ROS Normal cardiovascular exam Rhythm:Regular Rate:Normal     Neuro/Psych negative neurological ROS  negative psych ROS   GI/Hepatic Neg liver ROS, GERD  Medicated,  Endo/Other  negative endocrine ROS  Renal/GU negative Renal ROS  negative genitourinary   Musculoskeletal negative musculoskeletal ROS (+)   Abdominal   Peds negative pediatric ROS (+)  Hematology negative hematology ROS (+)   Anesthesia Other Findings   Reproductive/Obstetrics negative OB ROS                             Anesthesia Physical Anesthesia Plan  ASA: II  Anesthesia Plan: General   Post-op Pain Management:    Induction: Intravenous  PONV Risk Score and Plan: 3 and Ondansetron, Dexamethasone and Treatment may vary due to age or medical condition  Airway Management Planned: LMA  Additional Equipment:   Intra-op Plan:   Post-operative Plan: Extubation in OR  Informed Consent: I have reviewed the patients History and Physical, chart, labs and discussed the procedure including the risks, benefits and alternatives for the proposed anesthesia with the patient or authorized representative who has indicated his/her understanding and acceptance.     Dental advisory given  Plan Discussed with: CRNA and Surgeon  Anesthesia Plan Comments:         Anesthesia Quick Evaluation

## 2020-03-01 NOTE — Transfer of Care (Signed)
Immediate Anesthesia Transfer of Care Note  Patient: Desiree Snow  Procedure(s) Performed: RIGHT BREAST LUMPECTOMY WITH RADIOACTIVE SEED LOCALIZATION (Right Breast)  Patient Location: PACU  Anesthesia Type:General  Level of Consciousness: awake, alert , oriented and patient cooperative  Airway & Oxygen Therapy: Patient Spontanous Breathing and Patient connected to face mask oxygen  Post-op Assessment: Report given to RN and Post -op Vital signs reviewed and stable  Post vital signs: Reviewed and stable  Last Vitals:  Vitals Value Taken Time  BP 139/81 03/01/20 1555  Temp    Pulse 97 03/01/20 1555  Resp 18 03/01/20 1555  SpO2 99 % 03/01/20 1555  Vitals shown include unvalidated device data.  Last Pain:  Vitals:   03/01/20 1237  TempSrc: Oral      Patients Stated Pain Goal: 3 ([id number])  Complications: No apparent anesthesia complications

## 2020-03-02 ENCOUNTER — Encounter: Payer: Self-pay | Admitting: *Deleted

## 2020-03-02 NOTE — Anesthesia Postprocedure Evaluation (Signed)
Anesthesia Post Note  Patient: Desiree Snow  Procedure(s) Performed: RIGHT BREAST LUMPECTOMY WITH RADIOACTIVE SEED LOCALIZATION (Right Breast)     Patient location during evaluation: PACU Anesthesia Type: General Level of consciousness: awake and alert Pain management: pain level controlled Vital Signs Assessment: post-procedure vital signs reviewed and stable Respiratory status: spontaneous breathing, nonlabored ventilation, respiratory function stable and patient connected to nasal cannula oxygen Cardiovascular status: blood pressure returned to baseline and stable Postop Assessment: no apparent nausea or vomiting Anesthetic complications: no    Last Vitals:  Vitals:   03/01/20 1630 03/01/20 1645  BP: 132/73 (!) 143/81  Pulse: 90 88  Resp: 20 16  Temp:  36.6 C  SpO2: 96% 96%    Last Pain:  Vitals:   03/01/20 1645  TempSrc:   PainSc: 0-No pain                 Demika Langenderfer S

## 2020-03-03 LAB — SURGICAL PATHOLOGY

## 2020-04-27 IMAGING — MR MR HEAD WO/W CM
16 of 18 series · 42 of 48 positions shown · IV contrast (Contrast agent)
Comparison: None.

CLINICAL DATA: Seizure disorder. Status post right temporal
lobectomy.

EXAM:
MRI HEAD WITHOUT AND WITH CONTRAST
TECHNIQUE: Multiplanar, multiecho pulse sequences of the brain and surrounding
structures were obtained without and with intravenous contrast.
CONTRAST:  9mL GADAVIST GADOBUTROL 1 MMOL/ML IV SOLN

[Series 5: DWI · axial · 3.0mm · 0.88mm/px · z∈[-103,+50]mm · 7 of 104 slices shown (1 of 4)]
[im 1/104]
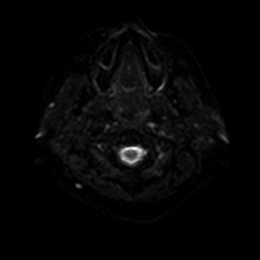
[im 18/104]
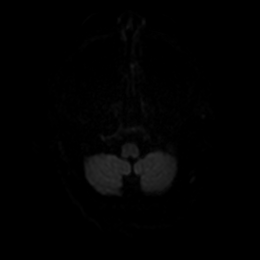
[im 35/104]
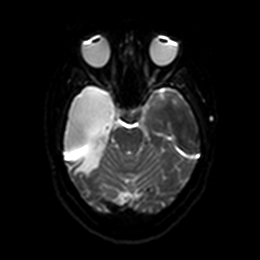
[im 52/104]
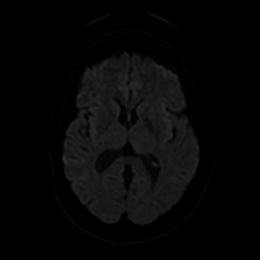
[im 69/104]
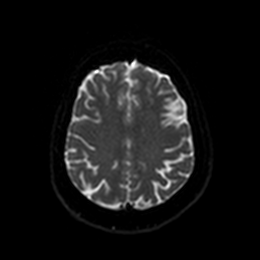
[im 86/104]
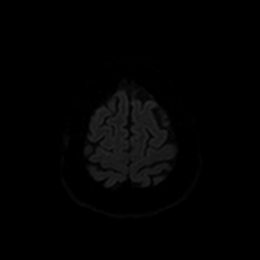
[im 104/104]
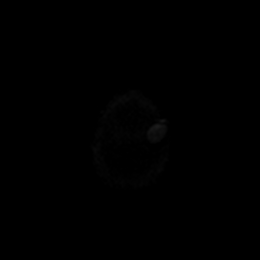

[Series 6: DWI · axial · 3.0mm · 0.88mm/px · z∈[-103,+50]mm · 4 of 52 slices shown (2 of 4)]
[im 1/52]
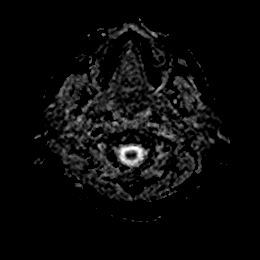
[im 18/52]
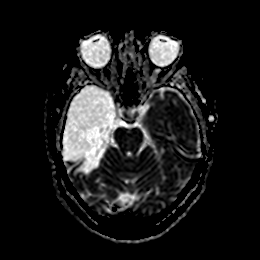
[im 35/52]
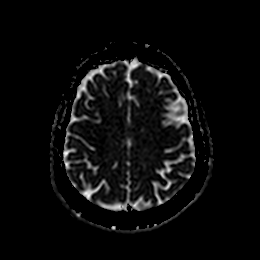
[im 52/52]
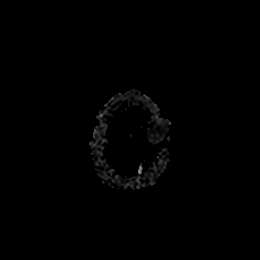

[Series 7: DWI · coronal · 4.0mm · 0.88mm/px · 5 of 76 slices shown (3 of 4)]
[im 1/76]
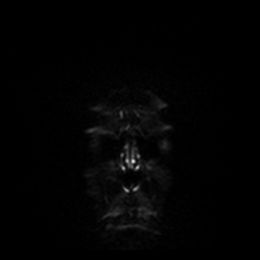
[im 19/76]
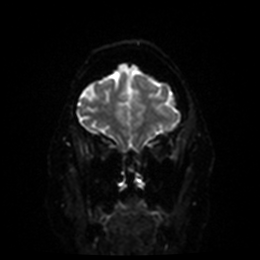
[im 38/76]
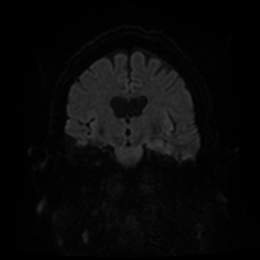
[im 57/76]
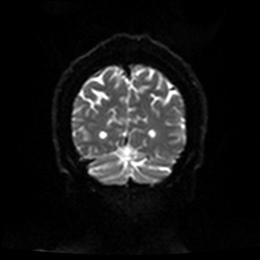
[im 76/76]
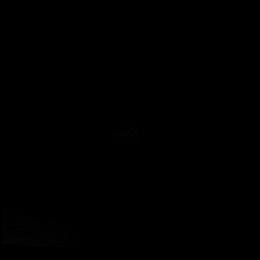

[Series 8: DWI · coronal · 4.0mm · 0.88mm/px · 2 of 37 slices shown (4 of 4)]
[im 1/37]
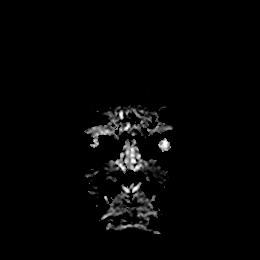
[im 37/37]
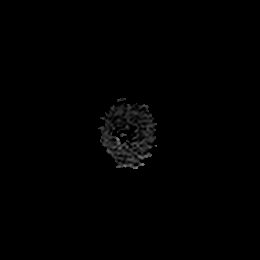

[Series 9: T1 · sagittal · 5.0mm · 0.75mm/px · 1 of 23 slices shown]
[im 1/23]
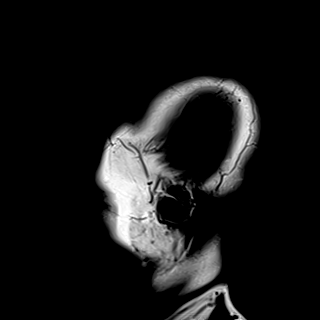

[Series 10: mag_images · axial · 3.0mm · 0.90mm/px · z∈[-108,+68]mm · 3 of 60 slices shown]
[im 1/60]
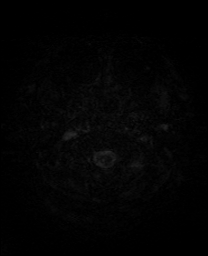
[im 30/60]
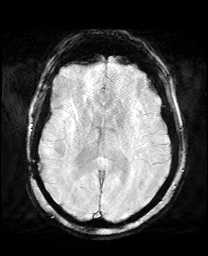
[im 60/60]
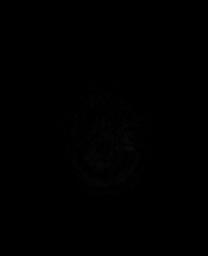

[Series 11: pha_images · axial · 3.0mm · 0.90mm/px · z∈[-105,+68]mm · 3 of 59 slices shown]
[im 1/59]
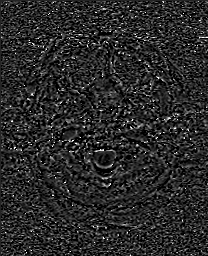
[im 30/59]
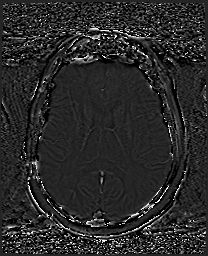
[im 59/59]
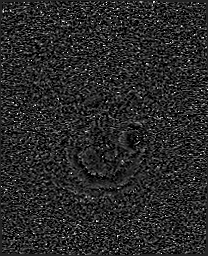

[Series 12: swi_images · axial · 3.0mm · 0.90mm/px · z∈[-108,+68]mm · 3 of 60 slices shown]
[im 1/60]
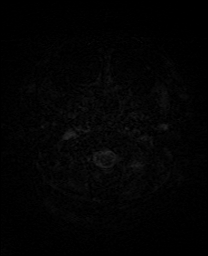
[im 30/60]
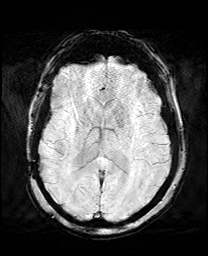
[im 60/60]
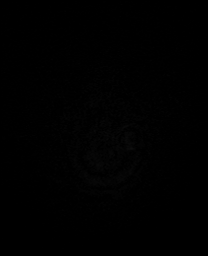

[Series 13: mip_images(sw) · axial · 24.0mm · 0.90mm/px · z∈[-98,+58]mm · 3 of 53 slices shown]
[im 1/53]
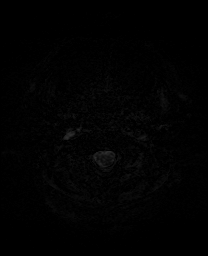
[im 27/53]
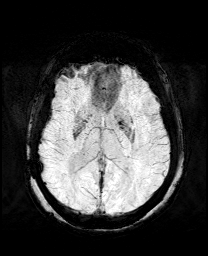
[im 53/53]
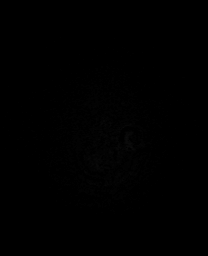

[Series 14: T2 · axial · 5.0mm · 0.72mm/px · 1 of 25 slices shown (1 of 2)]
[im 1/25]
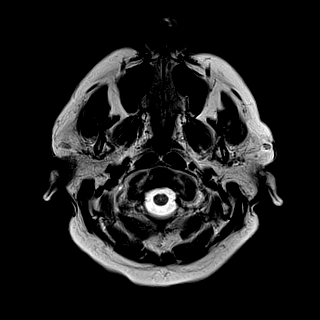

[Series 15: FLAIR · axial · 5.0mm · 0.45mm/px · 1 of 25 slices shown (1 of 2)]
[im 1/25]
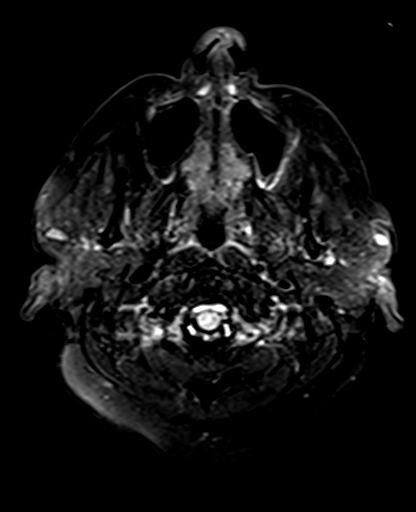

[Series 17: T2 · coronal · 3.0mm · 0.30mm/px · 2 of 38 slices shown (2 of 2)]
[im 1/38]
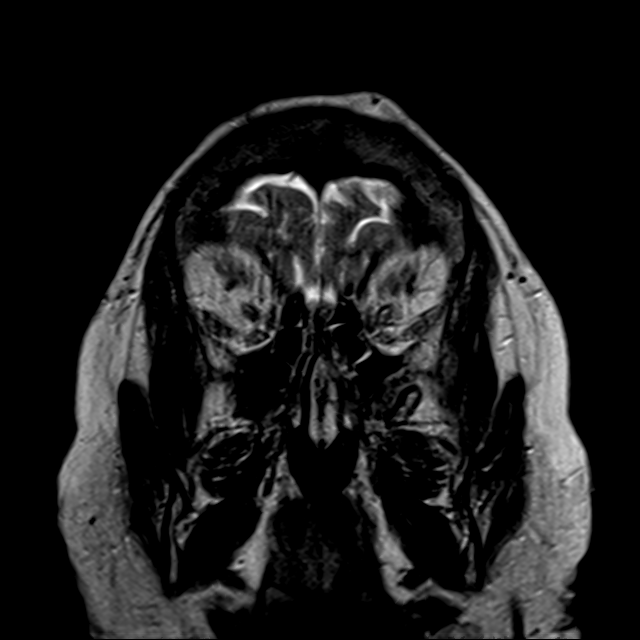
[im 38/38]
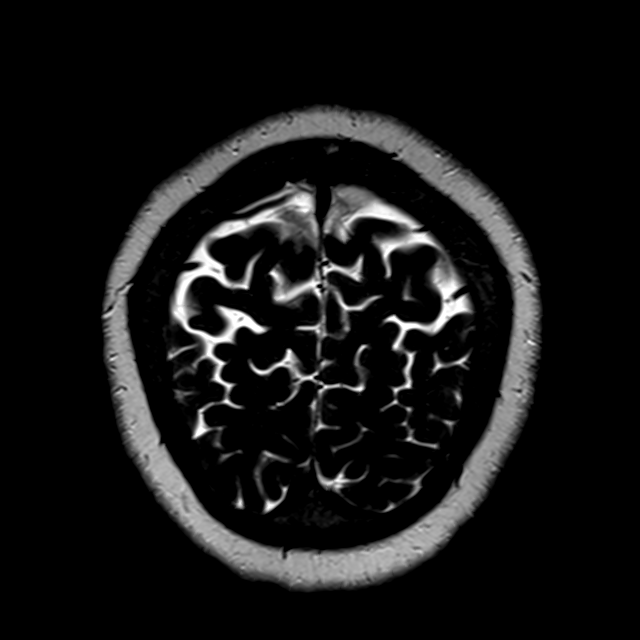

[Series 18: FLAIR · coronal · 3.0mm · 0.59mm/px · 2 of 38 slices shown (2 of 2)]
[im 1/38]
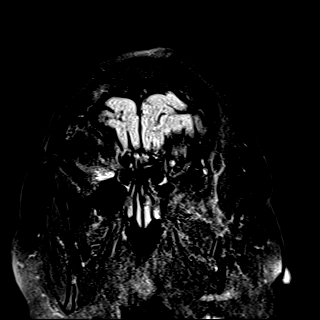
[im 38/38]
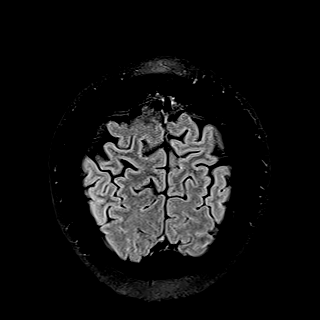

[Series 21: T1 post-contrast · coronal · 5.0mm · 0.34mm/px · 2 of 28 slices shown (1 of 2)]
[im 1/28]
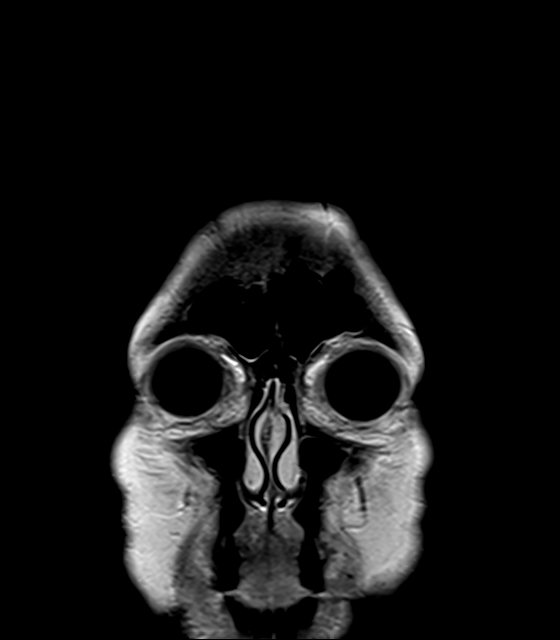
[im 28/28]
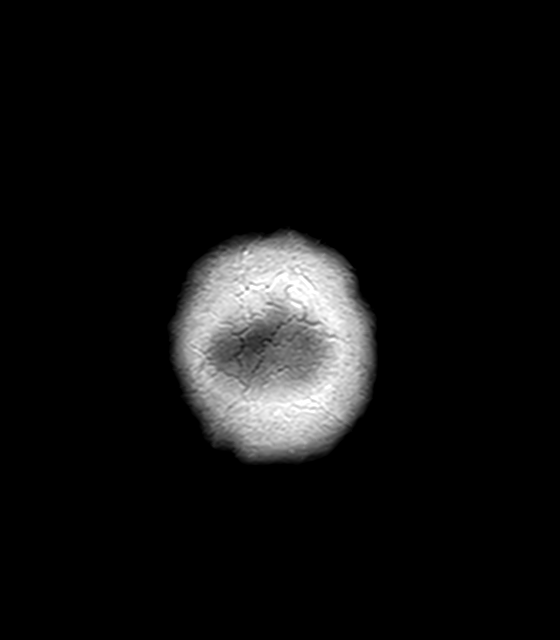

[Series 22: T1 post-contrast · sagittal · 5.0mm · 0.72mm/px · 1 of 23 slices shown (2 of 2)]
[im 1/23]
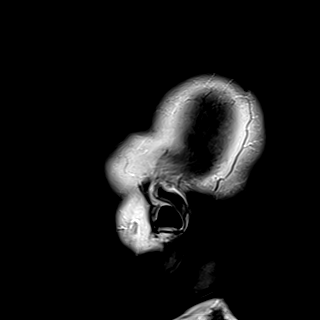

[Series 23: T2 post-contrast · coronal · 5.0mm · 0.72mm/px · 2 of 28 slices shown]
[im 1/28]
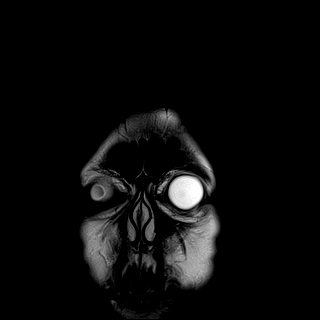
[im 28/28]
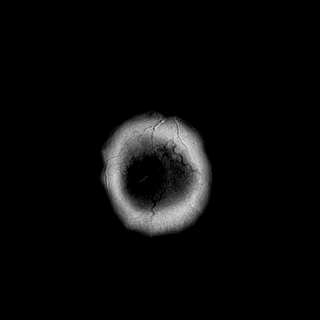

[42 of 48 positions shown; findings below may reference images not displayed]

FINDINGS: Brain: Right temporal lobectomy is noted. T2 signal changes adjacent
to the surgical site likely represent chronic gliosis. No focal mass
lesion is present. Left temporal lobe is unremarkable. Left
hippocampus is normal.

Focal dilated perivascular spaces are present in the lentiform
nucleus bilaterally.

No acute infarct, hemorrhage, or mass lesion is present. The
internal auditory canals are within normal limits. The brainstem and
cerebellum are within normal limits. The ventricles are of normal
size. No significant extraaxial fluid collection is present.

The postcontrast images demonstrate no pathologic enhancement.

Vascular: Flow is present in the major intracranial arteries.

Skull and upper cervical spine: The craniocervical junction is
normal. Upper cervical spine is within normal limits. Marrow signal
is unremarkable. A well-circumscribed T1 intermediate scalp lesion
measures 1.8 cm, consistent with a sebaceous cyst. There is no
associated enhancement

Sinuses/Orbits: The paranasal sinuses and mastoid air cells are
clear. The globes and orbits are within normal limits.
IMPRESSION: 1. Right temporal lobectomy without evidence for residual or
recurrent tumor.
2. Otherwise normal MRI appearance of the brain.

## 2020-06-22 ENCOUNTER — Other Ambulatory Visit: Payer: Self-pay | Admitting: Surgery

## 2020-06-22 DIAGNOSIS — Z9189 Other specified personal risk factors, not elsewhere classified: Secondary | ICD-10-CM

## 2020-07-06 ENCOUNTER — Ambulatory Visit: Payer: Medicare HMO | Admitting: Endocrinology

## 2020-07-12 ENCOUNTER — Ambulatory Visit (INDEPENDENT_AMBULATORY_CARE_PROVIDER_SITE_OTHER): Payer: Medicare Other | Admitting: Endocrinology

## 2020-07-12 ENCOUNTER — Other Ambulatory Visit: Payer: Self-pay

## 2020-07-12 ENCOUNTER — Encounter: Payer: Self-pay | Admitting: Endocrinology

## 2020-07-12 VITALS — BP 124/66 | HR 90 | Ht 66.0 in | Wt 215.6 lb

## 2020-07-12 DIAGNOSIS — E042 Nontoxic multinodular goiter: Secondary | ICD-10-CM

## 2020-07-12 LAB — TSH: TSH: 1.46 u[IU]/mL (ref 0.35–4.50)

## 2020-07-12 LAB — T4, FREE: Free T4: 0.74 ng/dL (ref 0.60–1.60)

## 2020-07-12 NOTE — Patient Instructions (Signed)
Let's recheck the ultrasound.  you will receive a phone call, about a day and time for an appointment.   Thyroid blood tests are requested for you today.  We'll let you know about the results.  Please come back for a follow-up appointment in 1 year.    

## 2020-07-12 NOTE — Progress Notes (Signed)
Subjective:    Patient ID: Desiree Snow, female    DOB: 01/26/1951, 69 y.o.   MRN: [id number]  HPI Pt returns for f/u of multinodular goiter: (dx'ed 2018, incidentally on a CT scan of the chest  in 2018; [place]: Nodule #4 in the inferior left thyroid lobe and Nodule #5 in the mid left thyroid lobe both meet criteria for biopsy.  Nodule #3 in the superior left thyroid lobe meets criteria for 1 year follow-up; bx in 2018 (LLP, labeled # 8 on [place]) showed BENIGN FOLLICULAR NODULE ([place] II; f/u US in 2019 shows LMP nodule (labeled #7 is stable in size and continues to meet criteria for bx; 8 is stable and previously underwent biopsy); she is euthyroid off rx).  Pt says she can notice the nodules, but is unaware of any change in size.  pt states she feels well in general.   Past Medical History:  Diagnosis Date  . Allergy   . Chicken pox   . Diverticulitis   . GERD (gastroesophageal reflux disease)   . Seizures ([place])    resolved    Past Surgical History:  Procedure Laterality Date  . ADENOIDECTOMY  1963  . BREAST EXCISIONAL BIOPSY Left   . BREAST EXCISIONAL BIOPSY Right   . BREAST LUMPECTOMY Left 2009   benign  . BREAST LUMPECTOMY WITH RADIOACTIVE SEED LOCALIZATION Right 03/01/2020   Procedure: RIGHT BREAST LUMPECTOMY WITH RADIOACTIVE SEED LOCALIZATION;  Surgeon: Erroll Luna, MD;  Location: [place];  Service: General;  Laterality: Right;  . LOBECTOMY FOR SEIZURE FOCUS    . SPLENECTOMY  1961  . TONSILLECTOMY  1963  . WISDOM TOOTH EXTRACTION      Social History   Socioeconomic History  . Marital status: Single    Spouse name: Not on file  . Number of children: Not on file  . Years of education: Not on file  . Highest education level: Not on file  Occupational History  . Not on file  Tobacco Use  . Smoking status: Former Smoker    Packs/day: 2.00    Years: 29.00    Pack years: 58.00    Types: Cigarettes    Quit date: 10/08/2002    Years since  quitting: 17.7  . Smokeless tobacco: Never Used  Vaping Use  . Vaping Use: Never used  Substance and Sexual Activity  . Alcohol use: No  . Drug use: No  . Sexual activity: Not on file  Other Topics Concern  . Not on file  Social History Narrative  . Not on file   Social Determinants of Health   Financial Resource Strain:   . Difficulty of Paying Living Expenses: Not on file  Food Insecurity:   . Worried About Charity fundraiser in the Last Year: Not on file  . Ran Out of Food in the Last Year: Not on file  Transportation Needs:   . Lack of Transportation (Medical): Not on file  . Lack of Transportation (Non-Medical): Not on file  Physical Activity:   . Days of Exercise per Week: Not on file  . Minutes of Exercise per Session: Not on file  Stress:   . Feeling of Stress : Not on file  Social Connections:   . Frequency of Communication with Friends and Family: Not on file  . Frequency of Social Gatherings with Friends and Family: Not on file  . Attends Religious Services: Not on file  . Active Member of Clubs or Organizations: Not  on file  . Attends Archivist Meetings: Not on file  . Marital Status: Not on file  Intimate Partner Violence:   . Fear of Current or Ex-Partner: Not on file  . Emotionally Abused: Not on file  . Physically Abused: Not on file  . Sexually Abused: Not on file    Current Outpatient Medications on File Prior to Visit  Medication Sig Dispense Refill  . Calcium Carbonate-Vit D-Min (CALCIUM 1200 PO) Take 1 tablet by mouth daily.    . Cholecalciferol (VITAMIN D3) 1000 units CAPS Take 1 capsule by mouth daily.    Marland Kitchen CINNAMON PO Take 100 tablets by mouth daily.     . clorazepate (TRANXENE) 3.75 MG tablet Take 1 tablet daily as needed by mouth.    . famotidine (PEPCID) 20 MG tablet Take 20 mg by mouth daily.     . Flaxseed, Linseed, (FLAX SEED OIL PO) Take by mouth.    . lamoTRIgine (LAMICTAL) 100 MG tablet Take 100 mg by mouth 2 (two) times  daily.    Marland Kitchen loratadine (CLARITIN) 10 MG tablet Take 10 mg by mouth daily.    . Magnesium 250 MG TABS Take 1 tablet by mouth daily.    . Multiple Vitamin (MULTIVITAMIN) tablet Take 1 tablet by mouth daily.    . TURMERIC PO Take 1 tablet by mouth daily.    Marland Kitchen UNABLE TO FIND Med Name: Collagen 2 otc once daily    . UNABLE TO FIND Med Name: CLA (over the counter) daily    . Zinc 50 MG CAPS Take 1 capsule by mouth daily.     No current facility-administered medications on file prior to visit.    Allergies  Allergen Reactions  . Cetirizine Other (See Comments)    Cognitive issues  . Metronidazole Other (See Comments)    Strange feeling and inability to function  . Quinolones   . Scopolamine Hives     on her fingers no itchng  . Penicillins Rash    Family History  Problem Relation Age of Onset  . Pneumonia Mother   . Dementia Father   . Breast cancer Maternal Aunt   . Goiter Neg Hx   . Thyroid cancer Neg Hx   . Thyroid nodules Neg Hx   . Thyroid disease Neg Hx     BP 124/66   Pulse 90   Ht 5\' 6"  (1.676 m)   Wt 215 lb 9.6 oz (97.8 kg)   SpO2 95%   BMI 34.80 kg/m    Review of Systems     Objective:   Physical Exam VITAL SIGNS:  See vs page.   GENERAL: no distress.   NECK: left and right thyroid nodules are again palpable.    Lab Results  Component Value Date   TSH 1.46 07/12/2020      Assessment & Plan:  MNG: due for recheck.   Patient Instructions  Let's recheck the ultrasound.  you will receive a phone call, about a day and time for an appointment.   Thyroid blood tests are requested for you today.  We'll let you know about the results.  Please come back for a follow-up appointment in 1 year.

## 2020-07-18 ENCOUNTER — Other Ambulatory Visit: Payer: Self-pay

## 2020-07-18 ENCOUNTER — Ambulatory Visit
Admission: RE | Admit: 2020-07-18 | Discharge: 2020-07-18 | Disposition: A | Discharge status: Home / Self Care | Payer: Medicare Other | Source: Ambulatory Visit | Attending: Surgery | Admitting: Surgery

## 2020-07-18 DIAGNOSIS — Z9189 Other specified personal risk factors, not elsewhere classified: Secondary | ICD-10-CM

## 2020-07-18 MED ORDER — GADOBUTROL 1 MMOL/ML IV SOLN
10.0000 mL | Freq: Once | INTRAVENOUS | Status: AC | PRN
  Administered 2020-07-18: 10 mL via INTRAVENOUS

## 2020-07-19 ENCOUNTER — Ambulatory Visit
Admission: RE | Admit: 2020-07-19 | Discharge: 2020-07-19 | Disposition: A | Discharge status: Home / Self Care | Payer: Medicare Other | Source: Ambulatory Visit | Attending: Endocrinology | Admitting: Endocrinology

## 2020-07-19 DIAGNOSIS — E042 Nontoxic multinodular goiter: Secondary | ICD-10-CM

## 2020-07-21 ENCOUNTER — Other Ambulatory Visit: Payer: Self-pay | Admitting: Surgery

## 2020-07-21 DIAGNOSIS — R9389 Abnormal findings on diagnostic imaging of other specified body structures: Secondary | ICD-10-CM

## 2020-07-29 ENCOUNTER — Ambulatory Visit
Admission: RE | Admit: 2020-07-29 | Discharge: 2020-07-29 | Disposition: A | Discharge status: Home / Self Care | Payer: Medicare Other | Source: Ambulatory Visit | Attending: Surgery | Admitting: Surgery

## 2020-07-29 ENCOUNTER — Other Ambulatory Visit: Payer: Self-pay

## 2020-07-29 DIAGNOSIS — R9389 Abnormal findings on diagnostic imaging of other specified body structures: Secondary | ICD-10-CM

## 2020-07-29 HISTORY — PX: BREAST BIOPSY: SHX20

## 2020-07-29 MED ORDER — GADOBUTROL 1 MMOL/ML IV SOLN
10.0000 mL | Freq: Once | INTRAVENOUS | Status: AC | PRN
  Administered 2020-07-29: 10 mL via INTRAVENOUS

## 2020-08-02 ENCOUNTER — Ambulatory Visit: Payer: Medicare Other | Admitting: Allergy

## 2020-08-03 NOTE — Progress Notes (Signed)
New Patient Note  RE: Desiree Snow MRN: [id number] DOB: 1950/11/18 Date of Office Visit: 08/04/2020  Referring provider: Haywood Pao, MD Primary care provider: Haywood Pao, MD  Chief Complaint: Allergic Rhinitis   History of Present Illness: I had the pleasure of seeing Desiree Snow for initial evaluation at the [facility] on 08/04/2020. She is a 69 y.o. female, who is referred here by [name], [name], MD for the evaluation of seasonal allergies.  Rhinitis:  She reports symptoms of nasal congestion, rhinorrhea,.PND, occasional sneezing, itchy eyes Symptoms have been going on for 40+ years. The symptoms are present mainly in the spring and summer. Other triggers include exposure to unknown. Anosmia: no. Headache: yes in the past. She has used mucinex, decongestant, antihistamines with fair improvement in symptoms. Sinus infections: no. Previous work up includes: none. Had some elevated eosinophils in the past and concerned if it may be due to allergies.  She is also concerned about antihistamines lowering her seizure threshold.  Previous ENT evaluation: in the past over 10 years ago. Previous sinus imaging: no. History of nasal polyps: no. Last eye exam: 2 weeks ago. History of reflux: yes and takes Pepcid in the morning.  Breathing: She reports symptoms of shortness of breath for about 1 year. Current medications include none.  Patient saw pulmonology and was told she had elevated eosinophils.  Had CXR in the past which was unremarkable per patient report.  Patient had full body PFT was normal.   History of pneumonia: no. She was evaluated by pulmonologist in the past. Smoking exposure: used to smoke in the past - quit 17 years ago. Up to date with flu vaccine: yes. Up to date with pneumonia vaccine: yes. Up to date with COVID-19 vaccine: yes.   Component     Latest Ref Rng & Units 04/12/2017 02/04/2019 02/26/2020  WBC     4.0 - 10.5  K/uL 10.0 12.4 (H) 8.8  RBC     3.87 - 5.11 MIL/uL 4.60 4.43 4.30  Hemoglobin     12.0 - 15.0 g/dL 14.4 14.0 13.4  HCT     36 - 46 % 42.6 41.4 40.8  MCV     80.0 - 100.0 fL 92.7 93.5 94.9  MCH     26.0 - 34.0 pg   31.2  MCHC     30.0 - 36.0 g/dL 33.7 33.7 32.8  RDW     11.5 - 15.5 % 12.8 13.1 12.7  Platelets     150 - 400 K/uL 346.0 327.0 322  nRBC     0.0 - 0.2 %   0.0  Neutrophils     % 49.6 45.0 52  NEUT#     1.7 - 7.7 K/uL 5.0 5.6 4.7  Lymphocytes     % 38.1 35.1 33  Lymphocyte #     0.7 - 4.0 K/uL 3.8 4.4 (H) 2.9  Monocytes Relative     % 8.6 8.7 10  Monocyte #     0.1 - 1.0 K/uL 0.9 1.1 (H) 0.9  Eosinophil     % 2.9 10.6 (H) 3  Eosinophils Absolute     0.0 - 0.5 K/uL 0.3 1.3 (H) 0.2  Basophil     % 0.8 0.6 1  Basophils Absolute     0.0 - 0.1 K/uL 0.1 0.1 0.1  Immature Granulocytes     %   1  Abs Immature Granulocytes     0.00 - 0.07  K/uL   0.04    Assessment and Plan: Jae is a 69 y.o. female with: Chronic rhinitis Perennial rhinitis symptoms for 40+ years mainly in the spring and summer.  Uses over-the-counter antihistamines, decongestant and Mucinex with good benefit.  Concerned for possible environmental allergies as she had elevated eosinophils in 2020 but repeat eosinophils in 2021 were only 200. Negative environmental panel via bloodwork in 2020.  Today's skin testing showed: Negative to indoor/outdoor allergies.  Patient most likely has non-allergic rhinitis.  May use Flonase (fluticasone) nasal spray 1 spray per nostril twice a day as needed for nasal congestion.   Nasal saline spray (i.e., Simply Saline) or nasal saline lavage (i.e., NeilMed) is recommended as needed and prior to medicated nasal sprays.  Not sure if there's any benefit in continuing antihistamines on a daily basis given these results.   Most recent eosinophils were normal.   Shortness of breath Episodes of shortness of breath mainly with exertion for 1 year. Patient was  evaluated by pulmonology and had normal full body PFT.  Today's spirometry showed some restriction  - most likely due to body habitus.  Monitor symptoms.  Recommend weight loss and increase exercising.   Penicillin allergy Rash as a child.  Consider penicillin allergy skin testing and in office drug challenge in the future.   Over 90% of people with history of penicillin allergy which occurred over 10 years ago are found to be non-allergic.   Return if symptoms worsen or fail to improve.  Meds ordered this encounter  Medications   fluticasone (FLONASE) 50 MCG/ACT nasal spray    Sig: Place 1 spray into both nostrils 2 (two) times daily as needed for allergies or rhinitis.    Dispense:  16 g    Refill:  5   Other allergy screening: Food allergy: no Medication allergy: yes - scopolamine - hives Penicillin - rash as a child.  Hymenoptera allergy: no Urticaria: no Eczema:no History of recurrent infections suggestive of immunodeficency: no  Diagnostics: Spirometry:  Tracings reviewed. Her effort: Good reproducible efforts. FVC: 2.54L FEV1: 2.12L, 86% predicted FEV1/FVC ratio: 83% Interpretation: Spirometry consistent with possible restrictive disease.  Please see scanned spirometry results for details.  Skin Testing: Environmental allergy panel. Negative test to: indoor/outdoor allergens.  Results discussed with patient/family.  Airborne Adult Perc - 08/04/20 1457    Allergen Manufacturer Lavella Hammock    Location Back    Number of Test 59    Panel 1 Select    1. Control-Buffer 50% Glycerol Negative    2. Control-Histamine 1 mg/ml 2+    3. Albumin saline Negative    4. Trinidad Negative    5. Guatemala Negative    6. Johnson Negative    7. Tennant Blue Negative    8. Meadow Fescue Negative    9. Perennial Rye Negative    10. Sweet Vernal Negative    11. Timothy Negative    12. Cocklebur Negative    13. Burweed Marshelder Negative    14. Ragweed, short Negative    15.  Ragweed, Giant Negative    16. Plantain,  English Negative    17. Lamb's Quarters Negative    18. Sheep Sorrell Negative    19. Rough Pigweed Negative    20. Marsh Elder, Rough Negative    21. Mugwort, Common Negative    22. Ash mix Negative    23. Birch mix Negative    24. Beech American Negative    25. Box, Elder Negative  Altheimer, red Negative    27. Cottonwood, [place] Negative    28. Elm mix Negative    29. Hickory Negative    30. Maple mix Negative    31. Oak, [place] mix Negative    32. Pecan Pollen Negative    33. Pine mix Negative    34. Sycamore Eastern Negative    35. Sabula, Black Pollen Negative    36. Alternaria alternata Negative    37. Cladosporium Herbarum Negative    38. Aspergillus mix Negative    39. Penicillium mix Negative    40. Bipolaris sorokiniana (Helminthosporium) Negative    41. Drechslera spicifera (Curvularia) Negative    42. Mucor plumbeus Negative    43. Fusarium moniliforme Negative    44. Aureobasidium pullulans (pullulara) Negative    45. Rhizopus oryzae Negative    46. Botrytis cinera Negative    47. Epicoccum nigrum Negative    48. Phoma betae Negative    49. Candida Albicans Negative    50. Trichophyton mentagrophytes Negative    51. Mite, D Farinae  5,000 AU/ml Negative    52. Mite, D Pteronyssinus  5,000 AU/ml Negative    53. Cat Hair 10,000 BAU/ml Negative    54.  Dog Epithelia Negative    55. Mixed Feathers Negative    56. Horse Epithelia Negative    57. Cockroach, German Negative    58. Mouse Negative    59. Tobacco Leaf Negative          Intradermal - 08/04/20 1522    Time Antigen Placed 0330    Allergen Manufacturer Lavella Hammock    Location Arm    Number of Test 15    Intradermal Select    Control Negative    Guatemala Negative    Johnson Negative    7 Grass Negative    Ragweed mix Negative    Weed mix Negative    Tree mix Negative    Mold 1 Negative    Mold 2 Negative    Mold 3 Negative    Mold 4 Negative     Cat Negative    Dog Negative    Cockroach Negative    Mite mix Negative           Past Medical History: Patient Active Problem List   Diagnosis Date Noted   Penicillin allergy 08/04/2020   Shortness of breath 08/04/2020   Chronic rhinitis 02/04/2019   DOE (dyspnea on exertion) 02/04/2019   Multinodular goiter 06/20/2017   Tobacco abuse 05/15/2017   Incidental pulmonary nodule 05/15/2017   Seizure disorder ([place]) 05/15/2017   GERD (gastroesophageal reflux disease) 04/12/2017   Class 1 obesity with body mass index (BMI) of 33.0 to 33.9 in adult 04/12/2017   IFG (impaired fasting glucose) 04/12/2017   Past Medical History:  Diagnosis Date   Allergy    Chicken pox    Diverticulitis    GERD (gastroesophageal reflux disease)    Seizures ([place])    resolved   Past Surgical History: Past Surgical History:  Procedure Laterality Date   ADENOIDECTOMY  1963   BREAST EXCISIONAL BIOPSY Left    BREAST EXCISIONAL BIOPSY Right    BREAST LUMPECTOMY Left 2009   benign   BREAST LUMPECTOMY WITH RADIOACTIVE SEED LOCALIZATION Right 03/01/2020   Procedure: RIGHT BREAST LUMPECTOMY WITH RADIOACTIVE SEED LOCALIZATION;  Surgeon: Erroll Luna, MD;  Location: [facility];  Service: General;  Laterality: Right;   LOBECTOMY FOR SEIZURE FOCUS     SPLENECTOMY  Nettie EXTRACTION     Medication List:  Current Outpatient Medications  Medication Sig Dispense Refill   Calcium Carbonate-Vit D-Min (CALCIUM 1200 PO) Take 1 tablet by mouth daily.     Cholecalciferol (VITAMIN D3) 1000 units CAPS Take 1 capsule by mouth daily.     CINNAMON PO Take 100 tablets by mouth daily.      clorazepate (TRANXENE) 3.75 MG tablet Take 1 tablet daily as needed by mouth.     famotidine (PEPCID) 20 MG tablet Take 20 mg by mouth daily.      Flaxseed, Linseed, (FLAX SEED OIL PO) Take by mouth.     lamoTRIgine (LAMICTAL) 100 MG tablet Take 100  mg by mouth 2 (two) times daily.     loratadine (CLARITIN) 10 MG tablet Take 10 mg by mouth daily.     Magnesium 250 MG TABS Take 1 tablet by mouth daily.     Multiple Vitamin (MULTIVITAMIN) tablet Take 1 tablet by mouth daily.     TURMERIC PO Take 1 tablet by mouth daily.     UNABLE TO FIND Med Name: Collagen 2 otc once daily     UNABLE TO FIND Med Name: CLA (over the counter) daily     Zinc 50 MG CAPS Take 1 capsule by mouth daily.     fluticasone (FLONASE) 50 MCG/ACT nasal spray Place 1 spray into both nostrils 2 (two) times daily as needed for allergies or rhinitis. 16 g 5   No current facility-administered medications for this visit.   Allergies: Allergies  Allergen Reactions   Cetirizine Other (See Comments)    Cognitive issues   Metronidazole Other (See Comments)    Strange feeling and inability to function   Quinolones    Scopolamine Hives     on her fingers no itchng   Penicillins Rash   Social History: Social History   Socioeconomic History   Marital status: Single    Spouse name: Not on file   Number of children: Not on file   Years of education: Not on file   Highest education level: Not on file  Occupational History   Not on file  Tobacco Use   Smoking status: Former Smoker    Packs/day: 2.00    Years: 29.00    Pack years: 58.00    Types: Cigarettes    Quit date: 10/08/2002    Years since quitting: 17.8   Smokeless tobacco: Never Used  Scientific laboratory technician Use: Never used  Substance and Sexual Activity   Alcohol use: No   Drug use: No   Sexual activity: Not on file  Other Topics Concern   Not on file  Social History Narrative   Not on file   Social Determinants of Health   Financial Resource Strain:    Difficulty of Paying Living Expenses: Not on file  Food Insecurity:    Worried About Charity fundraiser in the Last Year: Not on file   [employer] of Food in the Last Year: Not on file  Transportation Needs:    Lack of  Transportation (Medical): Not on file   Lack of Transportation (Non-Medical): Not on file  Physical Activity:    Days of Exercise per Week: Not on file   Minutes of Exercise per Session: Not on file  Stress:    Feeling of Stress : Not on file  Social Connections:    Frequency of Communication with Friends  and Family: Not on file   Frequency of Social Gatherings with Friends and Family: Not on file   Attends Religious Services: Not on file   Active Member of Clubs or Organizations: Not on file   Attends Club or Organization Meetings: Not on file   Marital Status: Not on file   Lives in an apartment. Smoking: quit 17 years ago Occupation: Presenter, broadcasting History: Water Damage/mildew in the house: no Charity fundraiser in the family room: yes Carpet in the bedroom: yes Heating: gas Cooling: central Pet: yes 2 cats x 9 yrs and 10 yrs  Family History: Family History  Problem Relation Age of Onset   Pneumonia Mother    Dementia Father    Breast cancer Maternal Aunt    Goiter Neg Hx    Thyroid cancer Neg Hx    Thyroid nodules Neg Hx    Thyroid disease Neg Hx    Problem                               Relation Asthma                                   No  Eczema                                No  Food allergy                          No  Allergic rhino conjunctivitis     No   Review of Systems  Constitutional: Negative for appetite change, chills, fever and unexpected weight change.  HENT: Positive for congestion, postnasal drip and rhinorrhea.   Eyes: Negative for itching.  Respiratory: Positive for shortness of breath. Negative for cough, chest tightness and wheezing.   Cardiovascular: Negative for chest pain.  Gastrointestinal: Negative for abdominal pain.  Genitourinary: Negative for difficulty urinating.  Skin: Negative for rash.  Allergic/Immunologic: Negative for environmental allergies.  Neurological: Negative for headaches.   Objective: BP  126/72    Pulse 97    Resp 17    Ht 5\' 6"  (1.676 m)    Wt 214 lb 8 oz (97.3 kg)    SpO2 97%    BMI 34.62 kg/m  Body mass index is 34.62 kg/m. Physical Exam Vitals and nursing note reviewed.  Constitutional:      Appearance: Normal appearance. She is well-developed.  HENT:     Head: Normocephalic and atraumatic.     Right Ear: External ear normal. There is impacted cerumen.     Left Ear: External ear normal.     Nose: Nose normal.     Mouth/Throat:     Mouth: Mucous membranes are moist.     Pharynx: Oropharynx is clear.  Eyes:     Conjunctiva/sclera: Conjunctivae normal.  Cardiovascular:     Rate and Rhythm: Normal rate and regular rhythm.     Heart sounds: Normal heart sounds. No murmur heard.  No friction rub. No gallop.   Pulmonary:     Effort: Pulmonary effort is normal.     Breath sounds: Normal breath sounds. No wheezing, rhonchi or rales.  Abdominal:     Palpations: Abdomen is soft.  Musculoskeletal:     Cervical back: Neck supple.  Skin:    General: Skin is warm.     Findings: No rash.  Neurological:     Mental Status: She is alert and oriented to person, place, and time.  Psychiatric:        Behavior: Behavior normal.    The plan was reviewed with the patient/family, and all questions/concerned were addressed.  It was my pleasure to see Michie today and participate in her care. Please feel free to contact me with any questions or concerns.  Sincerely,  Rexene Alberts, DO Allergy & Immunology  [facility] office: [place] office: [phone]

## 2020-08-04 ENCOUNTER — Ambulatory Visit: Payer: Medicare Other | Admitting: Allergy

## 2020-08-04 ENCOUNTER — Other Ambulatory Visit: Payer: Self-pay

## 2020-08-04 ENCOUNTER — Encounter: Payer: Self-pay | Admitting: Allergy

## 2020-08-04 VITALS — BP 126/72 | HR 97 | Resp 17 | Ht 66.0 in | Wt 214.5 lb

## 2020-08-04 DIAGNOSIS — R0602 Shortness of breath: Secondary | ICD-10-CM | POA: Diagnosis not present

## 2020-08-04 DIAGNOSIS — Z880 Allergy status to penicillin: Secondary | ICD-10-CM | POA: Diagnosis not present

## 2020-08-04 DIAGNOSIS — J310 Chronic rhinitis: Secondary | ICD-10-CM | POA: Diagnosis not present

## 2020-08-04 MED ORDER — FLUTICASONE PROPIONATE 50 MCG/ACT NA SUSP: 1.0000 | Freq: Two times a day (BID) | NASAL | 5 refills | Status: DC | PRN

## 2020-08-04 NOTE — Patient Instructions (Addendum)
Today's skin testing showed: Negative to indoor/outdoor allergies. Results given.   Rhinitis:   You most likely have non-allergic rhinitis.  May use Flonase (fluticasone) nasal spray 1 spray per nostril twice a day as needed for nasal congestion.   Nasal saline spray (i.e., Simply Saline) or nasal saline lavage (i.e., NeilMed) is recommended as needed and prior to medicated nasal sprays.  Breathing:  Today's breathing test showed some restriction - most likely due to body habitus.  Monitor symptoms.  Recommend weight loss and increase exercising.   Penicillin allergy:  Consider penicillin allergy skin testing and in office drug challenge in the future.   Over 90% of people with history of penicillin allergy which occurred over 10 years ago are found to be non-allergic.   You must be off antihistamines for 3-5 days before. Plan on being in the office for 2-3 hours. You must call to schedule an appointment and specify it's for a drug challenge.   A few days prior to the appointment, I will send in a prescription for amoxicillin liquid which you must bring to the appointment as well.   Follow up if needed.

## 2020-08-04 NOTE — Assessment & Plan Note (Signed)
Rash as a child.  Consider penicillin allergy skin testing and in office drug challenge in the future.   Over 90% of people with history of penicillin allergy which occurred over 10 years ago are found to be non-allergic.

## 2020-08-04 NOTE — Assessment & Plan Note (Addendum)
Perennial rhinitis symptoms for 40+ years mainly in the spring and summer.  Uses over-the-counter antihistamines, decongestant and Mucinex with good benefit.  Concerned for possible environmental allergies as she had elevated eosinophils in 2020 but repeat eosinophils in 2021 were only 200. Negative environmental panel via bloodwork in 2020.  Today's skin testing showed: Negative to indoor/outdoor allergies.  Patient most likely has non-allergic rhinitis.  May use Flonase (fluticasone) nasal spray 1 spray per nostril twice a day as needed for nasal congestion.   Nasal saline spray (i.e., Simply Saline) or nasal saline lavage (i.e., NeilMed) is recommended as needed and prior to medicated nasal sprays.  Not sure if there's any benefit in continuing antihistamines on a daily basis given these results.   Most recent eosinophils were normal.

## 2020-08-04 NOTE — Assessment & Plan Note (Signed)
Episodes of shortness of breath mainly with exertion for 1 year. Patient was evaluated by pulmonology and had normal full body PFT.  Today's spirometry showed some restriction  - most likely due to body habitus.  Monitor symptoms.  Recommend weight loss and increase exercising.

## 2020-08-22 ENCOUNTER — Ambulatory Visit: Payer: Self-pay | Admitting: Surgery

## 2020-08-22 DIAGNOSIS — N6489 Other specified disorders of breast: Secondary | ICD-10-CM

## 2020-08-22 NOTE — H&P (View-Only) (Signed)
Desiree Snow Appointment: 08/22/2020 3:00 PM Location: [facility] Patient #: [id number] DOB: [birth year] Single / Language: Desiree Snow / Race: White Female  History of Present Illness [name] A. 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 She has begun imaging with magnetic resonance imaging and 2 additional areas were found the right breast. Core biopsy showed more atypical ductal hyperplasia and complex sclerosing lesion. She is here today for follow-up and decide future planning. She has no physical complaints.                     ADDENDUM REPORT: 07/19/2020 15:34 ADDENDUM: The Recommendation section should read: MRI guided core needle biopsy of the 2 indeterminate RIGHT breast masses. Electronically Signed By: Desiree Snow M.D. On: 07/19/2020 15:34  Addended by Desiree Dakin, MD on 07/19/2020 3:36 PM Study Result  Addenda ADDENDUM REPORT: 07/19/2020 15:34 ADDENDUM: The Recommendation section should read: MRI guided core needle biopsy of the 2 indeterminate RIGHT breast masses. Electronically Signed By: Desiree Snow M.D. On: 07/19/2020 15:34  Signed by Desiree Dakin, MD on 07/19/2020 3:36 PM Narrative & Impression CLINICAL DATA: 69 year old who underwent excisional biopsy of a high risk complex sclerosing lesion with associated atypical ductal hyperplasia in March, 2021. High risk supplemental screening Breast MRI is performed. LABS: Not applicable. EXAM: BILATERAL BREAST MRI WITH AND WITHOUT CONTRAST TECHNIQUE: Multiplanar, multisequence MR images of both breasts were obtained prior to and following the intravenous administration of 10 ml of Gadavist. Three-dimensional MR images were rendered by post-processing of the original MR data on an independent workstation. The three-dimensional MR images were  interpreted, and findings are reported in the following complete MRI report for this study. Three dimensional images were evaluated at the independent interpreting workstation using the DynaCAD thin client. COMPARISON: No prior Breast MRI. Multiple prior mammograms and breast ultrasounds, most recently in May, 2021. FINDINGS: Breast composition: b. Scattered fibroglandular tissue. Background parenchymal enhancement: Mild to moderate. Right breast: Enhancing mass in the INNER breast at MIDDLE depth at the 3 o'clock position measuring approximately 9 x 6 x 6 mm (AP x coronal x craniocaudal), demonstrating washout kinetics. Second enhancing mass in the slight INNER subareolar location at the level of the nipple measuring approximately 6 x 6 x 9 mm, demonstrating washout kinetics. Left breast: No suspicious mass or abnormal enhancement. Lymph nodes: No pathologic lymphadenopathy. Ancillary findings: None. IMPRESSION: 1. Two indeterminate 9 mm masses in the RIGHT breast. One of the masses is located in the INNER breast at MIDDLE depth at the 3 o'clock location and the other is in the slight INNER subareolar location at the level of the nipple. 2. No MRI evidence of malignancy involving the LEFT breast. 3. No pathologic lymphadenopathy. RECOMMENDATION: MRI guided core needle biopsy of the 2 indeterminate LEFT breast masses. BI-RADS CATEGORY 4: Suspicious. Electronically Signed: By: Desiree Snow M.D. 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 - SEE MICROSCOPIC DESCRIPTION Microscopic Comment The atypical ductal hyperplasia has low grade cytologic atypia. Immunohistochemistry for cytokeratin 5/6, calponin, p63 and smooth muscle myosin supports the diagnosis. Dr. Melina Copa agrees. Called to the [place] on 08/01/20. (JDP:kh 08/01/20) Desiree Laws MD Pathologist, Electronic Signature (Case signed 08/01/2020) Specimen Gross and Clinical Information Specimen(s) Obtained: Breast, right, needle core biopsy Specimen  Clinical.  The patient is a 69 year old female.   Allergies Desiree Snow, [place]; 08/22/2020 3:04 PM) Scopolamine *ANTIEMETICS* Penicillins Quinolones Metronidazole and Related Allergies Reconciled  Medication History Desiree Snow, RMA; 08/22/2020 3:05 PM) lamoTRIgine (100MG  Tablet, Oral) Active. Calcium (Oral) Specific strength unknown - Active. Cinnamon (Oral) Specific strength unknown - Active. Multi-Vitamin (Oral) Active. Medications Reconciled    Vitals Desiree Snow RMA; 08/22/2020 3:05 PM) 08/22/2020 3:05 PM Weight: 216.5 lb Height: 66.5in Body Surface Area: 2.08 m Body Mass Index: 34.42 kg/m  Temp.: 98.28F  Pulse: 92 (Regular)  P.OX: 93% (Room air) BP: 124/76(Sitting, Left Arm, Standard)        Physical Exam (Desiree Snow A. Desiree Bezdek MD; 08/22/2020 3:43 PM)  General Mental Status-Alert. General Appearance-Consistent with stated age. Hydration-Well hydrated. Voice-Normal.  Breast Note: Not reexamined today  Neurologic Neurologic evaluation reveals -alert and oriented x 3 with no impairment of recent or remote memory. Mental Status-Normal.  Musculoskeletal Normal Exam - Left-Upper Extremity Strength Normal and Lower Extremity Strength Normal. Normal Exam - Right-Upper Extremity Strength Normal and Lower Extremity Strength Normal.    Assessment & Plan (Desiree Snow A. Desiree Shafer MD; 08/22/2020 3:40 PM)  AT HIGH RISK FOR BREAST CANCER (Z91.89) Impression: Lifetime risk over 20% for breast cancer. Discussed risk prevention strategies to include tamoxifen, urinary magnetic resonance imaging, the role of aromatase inhibitors at this point in time with observation. She would like to begin magnetic resonance imaging yearly in January. She has no interest  in tamoxifen at this point in time after discussion the pros, cons and side effects of the drug and long-term benefit. Discuss off label use of aromatase inhibitor as well. No interest in genetics currently. 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 The patient is chosen right breast seemed lumpectomy over the potential risk of malignancy which is in the range of 10% in these situations. Risk of lumpectomy include bleeding, infection, seroma, more surgery, use of seed/wire, wound care, cosmetic deformity and the need for other treatments, death , blood clots, death. Pt agrees to proceed.  total time 45 minutes for exam face to face time review of pathology , mammograms AND CHART  Current Plans Pt Education - [facility] Breast Biopsy HCI: discussed with patient and provided information.

## 2020-08-22 NOTE — H&P (Signed)
Gale K[facility] Pare Appointment: 08/22/2020 3:00 PM Location: [facility] Patient #: [id number] DOB: Mar 29, 1951 Single / Language: Cleophus Molt / Race: White Female  History of Present Illness [name] A. 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 She has begun imaging with magnetic resonance imaging and 2 additional areas were found the right breast. Core biopsy showed more atypical ductal hyperplasia and complex sclerosing lesion. She is here today for follow-up and decide future planning. She has no physical complaints.                     ADDENDUM REPORT: 07/19/2020 15:34 ADDENDUM: The Recommendation section should read: MRI guided core needle biopsy of the 2 indeterminate RIGHT breast masses. Electronically Signed By: Evangeline Dakin M.D. On: 07/19/2020 15:34  Addended by Evangeline Dakin, MD on 07/19/2020 3:36 PM Study Result  Addenda ADDENDUM REPORT: 07/19/2020 15:34 ADDENDUM: The Recommendation section should read: MRI guided core needle biopsy of the 2 indeterminate RIGHT breast masses. Electronically Signed By: Evangeline Dakin M.D. On: 07/19/2020 15:34  Signed by Evangeline Dakin, MD on 07/19/2020 3:36 PM Narrative & Impression CLINICAL DATA: 69 year old who underwent excisional biopsy of a high risk complex sclerosing lesion with associated atypical ductal hyperplasia in March, 2021. High risk supplemental screening Breast MRI is performed. LABS: Not applicable. EXAM: BILATERAL BREAST MRI WITH AND WITHOUT CONTRAST TECHNIQUE: Multiplanar, multisequence MR images of both breasts were obtained prior to and following the intravenous administration of 10 ml of Gadavist. Three-dimensional MR images were rendered by post-processing of the original MR data on an independent workstation. The three-dimensional MR images were  interpreted, and findings are reported in the following complete MRI report for this study. Three dimensional images were evaluated at the independent interpreting workstation using the DynaCAD thin client. COMPARISON: No prior Breast MRI. Multiple prior mammograms and breast ultrasounds, most recently in May, 2021. FINDINGS: Breast composition: b. Scattered fibroglandular tissue. Background parenchymal enhancement: Mild to moderate. Right breast: Enhancing mass in the INNER breast at MIDDLE depth at the 3 o'clock position measuring approximately 9 x 6 x 6 mm (AP x coronal x craniocaudal), demonstrating washout kinetics. Second enhancing mass in the slight INNER subareolar location at the level of the nipple measuring approximately 6 x 6 x 9 mm, demonstrating washout kinetics. Left breast: No suspicious mass or abnormal enhancement. Lymph nodes: No pathologic lymphadenopathy. Ancillary findings: None. IMPRESSION: 1. Two indeterminate 9 mm masses in the RIGHT breast. One of the masses is located in the INNER breast at MIDDLE depth at the 3 o'clock location and the other is in the slight INNER subareolar location at the level of the nipple. 2. No MRI evidence of malignancy involving the LEFT breast. 3. No pathologic lymphadenopathy. RECOMMENDATION: MRI guided core needle biopsy of the 2 indeterminate LEFT breast masses. BI-RADS CATEGORY 4: Suspicious. Electronically Signed: By: Evangeline Dakin M.D. 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 - SEE MICROSCOPIC DESCRIPTION Microscopic Comment The atypical ductal hyperplasia has low grade cytologic atypia. Immunohistochemistry for cytokeratin 5/6, calponin, p63 and smooth muscle myosin supports the diagnosis. Dr. Melina Copa agrees. Called to the [facility] on 08/01/20. (JDP:kh 08/01/20) Claudette Laws MD Pathologist, Electronic Signature (Case signed 08/01/2020) Specimen Gross and Clinical Information Specimen(s) Obtained: Breast, right, needle core biopsy Specimen  Clinical.  The patient is a 69 year old female.   Allergies [name], [place]; 08/22/2020 3:04 PM) Scopolamine *ANTIEMETICS* Penicillins Quinolones Metronidazole and Related Allergies Reconciled  Medication History [name], RMA; 08/22/2020 3:05 PM) lamoTRIgine (100MG  Tablet, Oral) Active. Calcium (Oral) Specific strength unknown - Active. Cinnamon (Oral) Specific strength unknown - Active. Multi-Vitamin (Oral) Active. Medications Reconciled    Vitals [name] RMA; 08/22/2020 3:05 PM) 08/22/2020 3:05 PM Weight: 216.5 lb Height: 66.5in Body Surface Area: 2.08 m Body Mass Index: 34.42 kg/m  Temp.: 98.12F  Pulse: 92 (Regular)  P.OX: 93% (Room air) BP: 124/76(Sitting, Left Arm, Standard)        Physical Exam (Kylo Gavin A. Kaian Fahs MD; 08/22/2020 3:43 PM)  General Mental Status-Alert. General Appearance-Consistent with stated age. Hydration-Well hydrated. Voice-Normal.  Breast Note: Not reexamined today  Neurologic Neurologic evaluation reveals -alert and oriented x 3 with no impairment of recent or remote memory. Mental Status-Normal.  Musculoskeletal Normal Exam - Left-Upper Extremity Strength Normal and Lower Extremity Strength Normal. Normal Exam - Right-Upper Extremity Strength Normal and Lower Extremity Strength Normal.    Assessment & Plan (Amelya Mabry A. Kristyana Notte MD; 08/22/2020 3:40 PM)  AT HIGH RISK FOR BREAST CANCER (Z91.89) Impression: Lifetime risk over 20% for breast cancer. Discussed risk prevention strategies to include tamoxifen, urinary magnetic resonance imaging, the role of aromatase inhibitors at this point in time with observation. She would like to begin magnetic resonance imaging yearly in January. She has no interest  in tamoxifen at this point in time after discussion the pros, cons and side effects of the drug and long-term benefit. Discuss off label use of aromatase inhibitor as well. No interest in genetics currently. 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 The patient is chosen right breast seemed lumpectomy over the potential risk of malignancy which is in the range of 10% in these situations. Risk of lumpectomy include bleeding, infection, seroma, more surgery, use of seed/wire, wound care, cosmetic deformity and the need for other treatments, death , blood clots, death. Pt agrees to proceed.  total time 45 minutes for exam face to face time review of pathology , mammograms AND CHART  Current Plans Pt Education - [facility] Breast Biopsy HCI: discussed with patient and provided information.

## 2020-09-13 ENCOUNTER — Other Ambulatory Visit: Payer: Self-pay | Admitting: Surgery

## 2020-09-13 DIAGNOSIS — N6489 Other specified disorders of breast: Secondary | ICD-10-CM

## 2020-09-14 ENCOUNTER — Encounter (HOSPITAL_BASED_OUTPATIENT_CLINIC_OR_DEPARTMENT_OTHER): Payer: Self-pay | Admitting: Surgery

## 2020-09-14 ENCOUNTER — Other Ambulatory Visit: Payer: Self-pay

## 2020-09-16 ENCOUNTER — Other Ambulatory Visit (HOSPITAL_COMMUNITY)
Admission: RE | Admit: 2020-09-16 | Discharge: 2020-09-16 | Disposition: A | Discharge status: Home / Self Care | Payer: Medicare Other | Source: Ambulatory Visit | Attending: Surgery | Admitting: Surgery

## 2020-09-16 DIAGNOSIS — Z01812 Encounter for preprocedural laboratory examination: Secondary | ICD-10-CM | POA: Insufficient documentation

## 2020-09-16 DIAGNOSIS — Z20822 Contact with and (suspected) exposure to covid-19: Secondary | ICD-10-CM | POA: Insufficient documentation

## 2020-09-16 LAB — SARS CORONAVIRUS 2 (TAT 6-24 HRS): SARS Coronavirus 2: NEGATIVE

## 2020-09-16 MED ORDER — CHLORHEXIDINE GLUCONATE CLOTH 2 % EX PADS: 6.0000 | MEDICATED_PAD | Freq: Once | CUTANEOUS | Status: DC

## 2020-09-16 NOTE — Progress Notes (Signed)
      Enhanced Recovery after Surgery for Orthopedics Enhanced Recovery after Surgery is a protocol used to improve the stress on your body and your recovery after surgery.  Patient Instructions  . The night before surgery:  o No food after midnight. ONLY clear liquids after midnight  . The day of surgery (if you do NOT have diabetes):  o Drink ONE (1) Pre-Surgery Clear Ensure as directed.   o This drink was given to you during your hospital  pre-op appointment visit. o The pre-op nurse will instruct you on the time to drink the  Pre-Surgery Ensure depending on your surgery time. o Finish the drink at the designated time by the pre-op nurse.  o Nothing else to drink after completing the  Pre-Surgery Clear Ensure.  . The day of surgery (if you have diabetes): o Drink ONE (1) Gatorade 2 (G2) as directed. o This drink was given to you during your hospital  pre-op appointment visit.  o The pre-op nurse will instruct you on the time to drink the   Gatorade 2 (G2) depending on your surgery time. o Color of the Gatorade may vary. Red is not allowed. o Nothing else to drink after completing the  Gatorade 2 (G2).         If you have questions, please contact your surgeon's office.  Patient given CHG pre-surgical soap. Patient educated and verbalized understanding.

## 2020-09-19 ENCOUNTER — Ambulatory Visit
Admission: RE | Admit: 2020-09-19 | Discharge: 2020-09-19 | Disposition: A | Discharge status: Home / Self Care | Payer: Medicare Other | Source: Ambulatory Visit | Attending: Surgery | Admitting: Surgery

## 2020-09-19 ENCOUNTER — Other Ambulatory Visit: Payer: Self-pay

## 2020-09-19 DIAGNOSIS — N6489 Other specified disorders of breast: Secondary | ICD-10-CM

## 2020-09-20 ENCOUNTER — Ambulatory Visit (HOSPITAL_BASED_OUTPATIENT_CLINIC_OR_DEPARTMENT_OTHER): Payer: Medicare Other | Admitting: Anesthesiology

## 2020-09-20 ENCOUNTER — Encounter (HOSPITAL_BASED_OUTPATIENT_CLINIC_OR_DEPARTMENT_OTHER)
Admission: RE | Disposition: A | Discharge status: Home / Self Care | Payer: Self-pay | Source: Home / Self Care | Attending: Surgery

## 2020-09-20 ENCOUNTER — Other Ambulatory Visit: Payer: Self-pay

## 2020-09-20 ENCOUNTER — Ambulatory Visit
Admission: RE | Admit: 2020-09-20 | Discharge: 2020-09-20 | Disposition: A | Discharge status: Home / Self Care | Payer: Medicare Other | Source: Ambulatory Visit | Attending: Surgery | Admitting: Surgery

## 2020-09-20 ENCOUNTER — Ambulatory Visit (HOSPITAL_BASED_OUTPATIENT_CLINIC_OR_DEPARTMENT_OTHER)
Admission: RE | Admit: 2020-09-20 | Discharge: 2020-09-21 | Disposition: A | Discharge status: Home / Self Care | Payer: Medicare Other | Attending: Surgery | Admitting: Surgery

## 2020-09-20 ENCOUNTER — Encounter (HOSPITAL_BASED_OUTPATIENT_CLINIC_OR_DEPARTMENT_OTHER): Payer: Self-pay | Admitting: Surgery

## 2020-09-20 DIAGNOSIS — N6011 Diffuse cystic mastopathy of right breast: Secondary | ICD-10-CM | POA: Diagnosis not present

## 2020-09-20 DIAGNOSIS — N6489 Other specified disorders of breast: Secondary | ICD-10-CM

## 2020-09-20 DIAGNOSIS — Z880 Allergy status to penicillin: Secondary | ICD-10-CM | POA: Insufficient documentation

## 2020-09-20 DIAGNOSIS — N6091 Unspecified benign mammary dysplasia of right breast: Secondary | ICD-10-CM | POA: Diagnosis not present

## 2020-09-20 HISTORY — DX: Diverticulosis of intestine, part unspecified, without perforation or abscess without bleeding: K57.90

## 2020-09-20 HISTORY — PX: BREAST EXCISIONAL BIOPSY: SUR124

## 2020-09-20 HISTORY — DX: Nontoxic goiter, unspecified: E04.9

## 2020-09-20 HISTORY — PX: BREAST LUMPECTOMY WITH RADIOACTIVE SEED LOCALIZATION: SHX6424

## 2020-09-20 SURGERY — BREAST LUMPECTOMY WITH RADIOACTIVE SEED LOCALIZATION
Anesthesia: General | Site: Breast | Laterality: Right

## 2020-09-20 MED ORDER — FENTANYL CITRATE (PF) 100 MCG/2ML IJ SOLN
INTRAMUSCULAR | Status: DC | PRN
  Administered 2020-09-20: 50 ug via INTRAVENOUS
  Administered 2020-09-20: 25 ug via INTRAVENOUS

## 2020-09-20 MED ORDER — SODIUM CHLORIDE 0.9% FLUSH: 3.0000 mL | Freq: Two times a day (BID) | INTRAVENOUS | Status: DC

## 2020-09-20 MED ORDER — SODIUM CHLORIDE 0.9 % IV SOLN
250.0000 mL | INTRAVENOUS | Status: DC | PRN
Start: 2020-09-20 — End: 2020-09-21

## 2020-09-20 MED ORDER — FAMOTIDINE 20 MG PO TABS: 20.0000 mg | ORAL_TABLET | Freq: Every day | ORAL | Status: DC

## 2020-09-20 MED ORDER — FENTANYL CITRATE (PF) 100 MCG/2ML IJ SOLN: 25.0000 ug | INTRAMUSCULAR | Status: DC | PRN

## 2020-09-20 MED ORDER — HYDROCODONE-ACETAMINOPHEN 5-325 MG PO TABS: 1.0000 | ORAL_TABLET | Freq: Four times a day (QID) | ORAL | 0 refills | Status: DC | PRN

## 2020-09-20 MED ORDER — ACETAMINOPHEN 500 MG PO TABS
1000.0000 mg | ORAL_TABLET | Freq: Once | ORAL | Status: AC
  Administered 2020-09-20: 1000 mg via ORAL

## 2020-09-20 MED ORDER — PROPOFOL 10 MG/ML IV BOLUS
INTRAVENOUS | Status: DC | PRN
  Administered 2020-09-20: 120 mg via INTRAVENOUS

## 2020-09-20 MED ORDER — LIDOCAINE 2% (20 MG/ML) 5 ML SYRINGE
INTRAMUSCULAR | Status: AC
  Filled 2020-09-20: qty 5

## 2020-09-20 MED ORDER — SODIUM CHLORIDE 0.9% FLUSH: 3.0000 mL | INTRAVENOUS | Status: DC | PRN

## 2020-09-20 MED ORDER — ONDANSETRON HCL 4 MG/2ML IJ SOLN
INTRAMUSCULAR | Status: DC | PRN
  Administered 2020-09-20: 4 mg via INTRAVENOUS

## 2020-09-20 MED ORDER — EPHEDRINE 5 MG/ML INJ
INTRAVENOUS | Status: AC
  Filled 2020-09-20: qty 10

## 2020-09-20 MED ORDER — ONDANSETRON HCL 4 MG/2ML IJ SOLN
INTRAMUSCULAR | Status: AC
  Filled 2020-09-20: qty 2

## 2020-09-20 MED ORDER — LAMOTRIGINE 100 MG PO TABS
100.0000 mg | ORAL_TABLET | Freq: Two times a day (BID) | ORAL | Status: DC
  Administered 2020-09-20: 100 mg via ORAL
  Filled 2020-09-20: qty 1

## 2020-09-20 MED ORDER — MIDAZOLAM HCL 5 MG/5ML IJ SOLN
INTRAMUSCULAR | Status: DC | PRN
  Administered 2020-09-20: 2 mg via INTRAVENOUS

## 2020-09-20 MED ORDER — DEXAMETHASONE SODIUM PHOSPHATE 10 MG/ML IJ SOLN
INTRAMUSCULAR | Status: AC
  Filled 2020-09-20: qty 1

## 2020-09-20 MED ORDER — FENTANYL CITRATE (PF) 100 MCG/2ML IJ SOLN
INTRAMUSCULAR | Status: AC
  Filled 2020-09-20: qty 2

## 2020-09-20 MED ORDER — MIDAZOLAM HCL 2 MG/2ML IJ SOLN
INTRAMUSCULAR | Status: AC
  Filled 2020-09-20: qty 2

## 2020-09-20 MED ORDER — CLINDAMYCIN PHOSPHATE 900 MG/50ML IV SOLN
900.0000 mg | INTRAVENOUS | Status: AC
  Administered 2020-09-20: 900 mg via INTRAVENOUS

## 2020-09-20 MED ORDER — DEXAMETHASONE SODIUM PHOSPHATE 10 MG/ML IJ SOLN
INTRAMUSCULAR | Status: DC | PRN
  Administered 2020-09-20: 10 mg via INTRAVENOUS

## 2020-09-20 MED ORDER — ACETAMINOPHEN 325 MG RE SUPP: 650.0000 mg | RECTAL | Status: DC | PRN

## 2020-09-20 MED ORDER — LIDOCAINE HCL (CARDIAC) PF 100 MG/5ML IV SOSY
PREFILLED_SYRINGE | INTRAVENOUS | Status: DC | PRN
  Administered 2020-09-20: 50 mg via INTRATRACHEAL

## 2020-09-20 MED ORDER — LACTATED RINGERS IV SOLN: INTRAVENOUS | Status: DC

## 2020-09-20 MED ORDER — OXYCODONE HCL 5 MG PO TABS: 5.0000 mg | ORAL_TABLET | ORAL | Status: DC | PRN

## 2020-09-20 MED ORDER — ACETAMINOPHEN 500 MG PO TABS
ORAL_TABLET | ORAL | Status: AC
  Filled 2020-09-20: qty 2

## 2020-09-20 MED ORDER — EPHEDRINE SULFATE 50 MG/ML IJ SOLN
INTRAMUSCULAR | Status: DC | PRN
  Administered 2020-09-20 (×2): 10 mg via INTRAVENOUS

## 2020-09-20 MED ORDER — ACETAMINOPHEN 325 MG PO TABS: 650.0000 mg | ORAL_TABLET | ORAL | Status: DC | PRN

## 2020-09-20 MED ORDER — PROPOFOL 10 MG/ML IV BOLUS
INTRAVENOUS | Status: AC
  Filled 2020-09-20: qty 20

## 2020-09-20 MED ORDER — BUPIVACAINE-EPINEPHRINE (PF) 0.25% -1:200000 IJ SOLN
INTRAMUSCULAR | Status: DC | PRN
  Administered 2020-09-20: 16 mL

## 2020-09-20 MED ORDER — CLINDAMYCIN PHOSPHATE 900 MG/50ML IV SOLN
INTRAVENOUS | Status: AC
  Filled 2020-09-20: qty 50

## 2020-09-20 MED ORDER — ONDANSETRON HCL 4 MG/2ML IJ SOLN: 4.0000 mg | Freq: Once | INTRAMUSCULAR | Status: DC | PRN

## 2020-09-20 MED ORDER — IBUPROFEN 800 MG PO TABS: 800.0000 mg | ORAL_TABLET | Freq: Three times a day (TID) | ORAL | 0 refills | Status: DC | PRN

## 2020-09-20 MED ORDER — MORPHINE SULFATE (PF) 4 MG/ML IV SOLN: 1.0000 mg | INTRAVENOUS | Status: DC | PRN

## 2020-09-20 SURGICAL SUPPLY — 48 items
APPLIER CLIP 9.375 MED OPEN (MISCELLANEOUS)
BINDER BREAST LRG (GAUZE/BANDAGES/DRESSINGS) IMPLANT
BINDER BREAST MEDIUM (GAUZE/BANDAGES/DRESSINGS) IMPLANT
BINDER BREAST XLRG (GAUZE/BANDAGES/DRESSINGS) ×2 IMPLANT
BINDER BREAST XXLRG (GAUZE/BANDAGES/DRESSINGS) IMPLANT
BLADE SURG 15 STRL LF DISP TIS (BLADE) ×1 IMPLANT
BLADE SURG 15 STRL SS (BLADE) ×1
CANISTER SUC SOCK COL 7IN (MISCELLANEOUS) IMPLANT
CANISTER SUCT 1200ML W/VALVE (MISCELLANEOUS) IMPLANT
CHLORAPREP W/TINT 26 (MISCELLANEOUS) ×2 IMPLANT
CLIP APPLIE 9.375 MED OPEN (MISCELLANEOUS) IMPLANT
COVER BACK TABLE 60X90IN (DRAPES) ×2 IMPLANT
COVER MAYO STAND STRL (DRAPES) ×2 IMPLANT
COVER PROBE W GEL 5X96 (DRAPES) ×2 IMPLANT
COVER WAND RF STERILE (DRAPES) IMPLANT
DECANTER SPIKE VIAL GLASS SM (MISCELLANEOUS) IMPLANT
DERMABOND ADVANCED (GAUZE/BANDAGES/DRESSINGS) ×1
DERMABOND ADVANCED .7 DNX12 (GAUZE/BANDAGES/DRESSINGS) ×1 IMPLANT
DRAPE LAPAROSCOPIC ABDOMINAL (DRAPES) IMPLANT
DRAPE LAPAROTOMY 100X72 PEDS (DRAPES) ×2 IMPLANT
DRAPE UTILITY XL STRL (DRAPES) ×2 IMPLANT
ELECT COATED BLADE 2.86 ST (ELECTRODE) ×2 IMPLANT
ELECT REM PT RETURN 9FT ADLT (ELECTROSURGICAL) ×2
ELECTRODE REM PT RTRN 9FT ADLT (ELECTROSURGICAL) ×1 IMPLANT
GLOVE ECLIPSE 8.0 STRL XLNG CF (GLOVE) ×2 IMPLANT
GLOVE SRG 8 PF TXTR STRL LF DI (GLOVE) ×1 IMPLANT
GLOVE SURG UNDER POLY LF SZ8 (GLOVE) ×1
GOWN STRL REUS W/ TWL LRG LVL3 (GOWN DISPOSABLE) ×1 IMPLANT
GOWN STRL REUS W/ TWL XL LVL3 (GOWN DISPOSABLE) ×1 IMPLANT
GOWN STRL REUS W/TWL LRG LVL3 (GOWN DISPOSABLE) ×1
GOWN STRL REUS W/TWL XL LVL3 (GOWN DISPOSABLE) ×1
HEMOSTAT ARISTA ABSORB 3G PWDR (HEMOSTASIS) IMPLANT
HEMOSTAT SNOW SURGICEL 2X4 (HEMOSTASIS) IMPLANT
KIT MARKER MARGIN INK (KITS) ×2 IMPLANT
NEEDLE HYPO 25X1 1.5 SAFETY (NEEDLE) ×2 IMPLANT
NS IRRIG 1000ML POUR BTL (IV SOLUTION) ×2 IMPLANT
PACK BASIN DAY SURGERY FS (CUSTOM PROCEDURE TRAY) ×2 IMPLANT
PENCIL SMOKE EVACUATOR (MISCELLANEOUS) ×2 IMPLANT
SLEEVE SCD COMPRESS KNEE MED (MISCELLANEOUS) ×2 IMPLANT
SPONGE LAP 4X18 RFD (DISPOSABLE) ×2 IMPLANT
SUT MNCRL AB 4-0 PS2 18 (SUTURE) ×2 IMPLANT
SUT SILK 2 0 SH (SUTURE) IMPLANT
SUT VICRYL 3-0 CR8 SH (SUTURE) ×2 IMPLANT
SYR CONTROL 10ML LL (SYRINGE) ×2 IMPLANT
TOWEL GREEN STERILE FF (TOWEL DISPOSABLE) ×2 IMPLANT
TRAY FAXITRON CT DISP (TRAY / TRAY PROCEDURE) ×2 IMPLANT
TUBE CONNECTING 20X1/4 (TUBING) IMPLANT
YANKAUER SUCT BULB TIP NO VENT (SUCTIONS) IMPLANT

## 2020-09-20 NOTE — Transfer of Care (Signed)
Immediate Anesthesia Transfer of Care Note  Patient: Desiree Snow  Procedure(s) Performed: RIGHT BREAST LUMPECTOMY WITH RADIOACTIVE SEED LOCALIZATION (Right Breast)  Patient Location: PACU  Anesthesia Type:General  Level of Consciousness: awake, alert  and oriented  Airway & Oxygen Therapy: Patient Spontanous Breathing and Patient connected to face mask oxygen  Post-op Assessment: Report given to RN and Post -op Vital signs reviewed and stable  Post vital signs: Reviewed and stable  Last Vitals:  Vitals Value Taken Time  BP 101/41 09/20/20 1623  Temp 36.2 C 09/20/20 1623  Pulse 97 09/20/20 1626  Resp 18 09/20/20 1626  SpO2 99 % 09/20/20 1626  Vitals shown include unvalidated device data.  Last Pain:  Vitals:   09/20/20 1356  TempSrc: Oral  PainSc: 0-No pain         Complications: No complications documented.

## 2020-09-20 NOTE — Op Note (Signed)
Preoperative diagnosis: Right breast complex sclerosing lesion  Postop diagnosis: Same  Procedure: Right breast seed localized lumpectomy  Surgeon: Erroll Luna, MD  Anesthesia: LMA with local  EBL: Minimal  Specimen: Right breast tissue to pathology with seed and clip verified by Faxitron  Drains: None  IV fluids: Per anesthesia record  Indications for procedure: The patient presents for right breast seed lumpectomy due to a mammographic abnormality.  Core biopsy showed a complex sclerosing lesion.  We discussed the pros and cons of excision and the potential upgrade risk to malignancy with these lesions when discovered on core biopsy.  She was to proceed with right breast seed localized lumpectomy.The procedure has been discussed with the patient. Alternatives to surgery have been discussed with the patient.  Risks of surgery include bleeding,  Infection,  Seroma formation, death, cosmetic deformity, nipple loss and the need for further surgery.   The patient understands and wishes to proceed.  Description of procedure: The patient was met in the holding area and questions were answered.  Neoprobe used to verify seed location right breast which was central.  Films available for review.  She was taken back to the operating room.  She is placed supine upon the OR table.  After induction of general anesthesia, right breast was prepped and draped in a sterile fashion timeout performed.  Proper patient, site and procedure were verified and she received appropriate preoperative antibiotics.  Neoprobe was used and the seed was directly posterior to the nipple areolar complex.  Curvilinear incision was made along the inferior border of the nipple areolar complex.  Dissection was carried in a small area of tissue was excised corresponding to the lesion.  The Faxitron image revealed the seed clip to be specimen.  Hemostasis achieved with cautery.  Wound closed with 3-0 Vicryl and 4 Monocryl.  Dermabond  was applied.  All counts were found to be correct.  Breast binder placed.  Patient was awoke extubated taken to recovery in satisfactory condition.

## 2020-09-20 NOTE — Interval H&P Note (Signed)
History and Physical Interval Note:  09/20/2020 3:03 PM  Desiree Snow  has presented today for surgery, with the diagnosis of [name].  The various methods of treatment have been discussed with the patient and family. After consideration of risks, benefits and other options for treatment, the patient has consented to  Procedure(s): RIGHT BREAST LUMPECTOMY WITH RADIOACTIVE SEED LOCALIZATION (Right) as a surgical intervention.  The patient's history has been reviewed, patient examined, no change in status, stable for surgery.  I have reviewed the patient's chart and labs.  Questions were answered to the patient's satisfaction.     Turner Daniels MD

## 2020-09-20 NOTE — Anesthesia Procedure Notes (Signed)
Procedure Name: LMA Insertion Date/Time: 09/20/2020 3:39 PM Performed by: Glory Buff, CRNA Pre-anesthesia Checklist: Patient identified, Emergency Drugs available, Suction available and Patient being monitored Patient Re-evaluated:Patient Re-evaluated prior to induction Oxygen Delivery Method: Circle system utilized Preoxygenation: Pre-oxygenation with 100% oxygen Induction Type: IV induction LMA: LMA inserted LMA Size: 4.0 Number of attempts: 1 Placement Confirmation: positive ETCO2 Tube secured with: Tape Dental Injury: Teeth and Oropharynx as per pre-operative assessment

## 2020-09-20 NOTE — Anesthesia Preprocedure Evaluation (Addendum)
Anesthesia Evaluation  Patient identified by MRN, date of birth, ID band Patient awake    Reviewed: Allergy & Precautions, H&P , NPO status , Patient's Chart, lab work & pertinent test results  Airway Mallampati: II  TM Distance: >3 FB Neck ROM: Full    Dental no notable dental hx. (+) Teeth Intact, Dental Advisory Given   Pulmonary former smoker,  Quit smoking 2004, 58 pack year history   Pulmonary exam normal breath sounds clear to auscultation       Cardiovascular negative cardio ROS Normal cardiovascular exam Rhythm:Regular Rate:Normal     Neuro/Psych Seizures -, Well Controlled,  negative psych ROS   GI/Hepatic Neg liver ROS, GERD  Controlled,  Endo/Other  Obesity BMI 35  Renal/GU negative Renal ROS  negative genitourinary   Musculoskeletal negative musculoskeletal ROS (+)   Abdominal (+) + obese,   Peds negative pediatric ROS (+)  Hematology negative hematology ROS (+)   Anesthesia Other Findings Complex sclerosing lesion R breast   Reproductive/Obstetrics negative OB ROS                            Anesthesia Physical Anesthesia Plan  ASA: III  Anesthesia Plan: General   Post-op Pain Management:    Induction: Intravenous  PONV Risk Score and Plan: 3 and Ondansetron, Dexamethasone and Treatment may vary due to age or medical condition  Airway Management Planned: LMA  Additional Equipment: None  Intra-op Plan:   Post-operative Plan: Extubation in OR  Informed Consent: I have reviewed the patients History and Physical, chart, labs and discussed the procedure including the risks, benefits and alternatives for the proposed anesthesia with the patient or authorized representative who has indicated his/her understanding and acceptance.     Dental advisory given  Plan Discussed with: CRNA  Anesthesia Plan Comments:        Anesthesia Quick Evaluation

## 2020-09-20 NOTE — Anesthesia Postprocedure Evaluation (Signed)
Anesthesia Post Note  Patient: Desiree Snow  Procedure(s) Performed: RIGHT BREAST LUMPECTOMY WITH RADIOACTIVE SEED LOCALIZATION (Right Breast)     Patient location during evaluation: PACU Anesthesia Type: General Level of consciousness: awake and alert Pain management: pain level controlled Vital Signs Assessment: post-procedure vital signs reviewed and stable Respiratory status: spontaneous breathing, nonlabored ventilation and respiratory function stable Cardiovascular status: blood pressure returned to baseline and stable Postop Assessment: no apparent nausea or vomiting Anesthetic complications: no   No complications documented.  Last Vitals:  Vitals:   09/20/20 1630 09/20/20 1645  BP: 120/68 (!) 146/84  Pulse: 96 96  Resp: 20 17  Temp:    SpO2: 97% 97%    Last Pain:  Vitals:   09/20/20 1630  TempSrc:   PainSc: 0-No pain                 Merlinda Frederick

## 2020-09-21 ENCOUNTER — Encounter (HOSPITAL_BASED_OUTPATIENT_CLINIC_OR_DEPARTMENT_OTHER): Payer: Self-pay | Admitting: Surgery

## 2020-09-21 NOTE — Discharge Instructions (Signed)
[facility] Office Phone Number [phone]  BREAST BIOPSY/ PARTIAL MASTECTOMY: POST OP INSTRUCTIONS  Always review your discharge instruction sheet given to you by the facility where your surgery was performed.  IF YOU HAVE DISABILITY OR FAMILY LEAVE FORMS, YOU MUST BRING THEM TO THE OFFICE FOR PROCESSING.  DO NOT GIVE THEM TO YOUR DOCTOR.  1. A prescription for pain medication may be given to you upon discharge.  Take your pain medication as prescribed, if needed.  If narcotic pain medicine is not needed, then you may take acetaminophen (Tylenol) or ibuprofen (Advil) as needed. 2. Take your usually prescribed medications unless otherwise directed 3. If you need a refill on your pain medication, please contact your pharmacy.  They will contact our office to request authorization.  Prescriptions will not be filled after 5pm or on week-ends. 4. You should eat very light the first 24 hours after surgery, such as soup, crackers, pudding, etc.  Resume your normal diet the day after surgery. 5. Most patients will experience some swelling and bruising in the breast.  Ice packs and a good support bra will help.  Swelling and bruising can take several days to resolve.  6. It is common to experience some constipation if taking pain medication after surgery.  Increasing fluid intake and taking a stool softener will usually help or prevent this problem from occurring.  A mild laxative (Milk of Magnesia or Miralax) should be taken according to package directions if there are no bowel movements after 48 hours. 7. Unless discharge instructions indicate otherwise, you may remove your bandages 24-48 hours after surgery, and you may shower at that time.  You may have steri-strips (small skin tapes) in place directly over the incision.  These strips should be left on the skin for 7-10 days.  If your surgeon used skin glue on the incision, you may shower in 24 hours.  The glue will flake off over the  next 2-3 weeks.  Any sutures or staples will be removed at the office during your follow-up visit. 8. ACTIVITIES:  You may resume regular daily activities (gradually increasing) beginning the next day.  Wearing a good support bra or sports bra minimizes pain and swelling.  You may have sexual intercourse when it is comfortable. a. You may drive when you no longer are taking prescription pain medication, you can comfortably wear a seatbelt, and you can safely maneuver your car and apply brakes. b. RETURN TO WORK:  ______________________________________________________________________________________ 9. You should see your doctor in the office for a follow-up appointment approximately two weeks after your surgery.  Your doctor's nurse will typically make your follow-up appointment when she calls you with your pathology report.  Expect your pathology report 2-3 business days after your surgery.  You may call to check if you do not hear from [place] after three days.  WHEN TO CALL YOUR DOCTOR: 1. Fever over 101.0 2. Nausea and/or vomiting. 3. Extreme swelling or bruising. 4. Continued bleeding from incision. 5. Increased pain, redness, or drainage from the incision.  The clinic staff is available to answer your questions during regular business hours.  Please don't hesitate to call and ask to speak to one of the nurses for clinical concerns.  If you have a medical emergency, go to the nearest emergency room or call 911.  A surgeon from [facility] is always on call at the hospital.  For further questions, please visit [email or web address]

## 2020-09-21 NOTE — Discharge Summary (Signed)
Physician Discharge Summary  Patient ID: Desiree Snow MRN: [id number] DOB/AGE: Dec 13, 1950 69 y.o.  Admit date: 09/20/2020 Discharge date: 09/21/2020  Admission Diagnoses:right breast radial scar   Discharge Diagnoses: same    Discharged Condition: good  Hospital Course: Pt did well She had good pain control, tolerated her diet and ambulated without difficulty.       Treatments: surgery: right breast lumpectomy   Discharge Exam: Blood pressure 132/76, pulse (!) 110, temperature 97.7 F (36.5 C), resp. rate 16, height 5\' 6"  (1.676 m), weight 96.9 kg, SpO2 97 %. General appearance: alert and cooperative Cardio: NSR Incision/Wound:CDI   Disposition: Discharge disposition: 01-Home or Self Care       Discharge Instructions    Diet - low sodium heart healthy   Complete by: As directed    Increase activity slowly   Complete by: As directed         Signed: Turner Daniels MD  09/21/2020, 8:31 AM

## 2020-09-22 LAB — SURGICAL PATHOLOGY

## 2020-11-30 ENCOUNTER — Encounter: Payer: Self-pay | Admitting: *Deleted

## 2020-11-30 ENCOUNTER — Telehealth: Payer: Self-pay | Admitting: Hematology

## 2020-11-30 NOTE — Telephone Encounter (Signed)
I received a staff msg to get Ms. Desiree Snow to be seen in te high risk breast clinic. Ms. Desiree Snow has been cld and scheduled to see Dr. Burr Medico for the high risk clinic on 3/10 at 3pm.

## 2020-12-14 DIAGNOSIS — N6091 Unspecified benign mammary dysplasia of right breast: Secondary | ICD-10-CM | POA: Insufficient documentation

## 2020-12-14 NOTE — Progress Notes (Signed)
[place]   Telephone:(336) [phone] [phone]) [phone]   Clinic New Consult Note   Patient Care Team: [name], [name], MD as PCP - General (Internal Medicine)  Date of Service:  12/15/2020   CHIEF COMPLAINTS/PURPOSE OF CONSULTATION:  Right breast ADH, High risk for breast cancer  REFERRING PHYSICIAN:  Self-referral   HISTORY OF PRESENTING ILLNESS: Desiree Snow 70 y.o. female is a here because of ADH and high risk of breast cancer. 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 The patient presents to the clinic today by her self.   She has no personal or family history of breast cancer.  Her routine screening mammogram in March 2021 showed possible distortion in the right breast.  She underwent diagnostic mammogram and ultrasound subsequently which showed 4 mm mass in the upper outer quadrant of right breast, biopsy showed complex sclerosing lesion.  She was referred to breast surgeon Dr. Brantley Stage and underwent right breast lumpectomy on Mar 01, 2020.  Pathology showed atypical ductal hyperplasia.  Due to the high risk of breast cancer, she underwent screening breast MRI on July 19, 2020, which showed 2 indeterminate 9 mm masses in the right breast, biopsy showed ADH.  She underwent second lumpectomy of right breast on September 20, 2020, which showed ADH and residual complex sclerosing lesion.  She has recovered well from surgery.  She has a mild residual numbness around her right nipple, no breast pain or other complaints.  She is followed Dr. Brantley Stage in January 2022.  Dr. Brantley Stage discussed her high risk of future breast cancer, and risk reduction strategies.  He recommended antiestrogen therapy with tamoxifen or anastrozole, patient opted with anastrozole.  She has been tolerating well for the past 2 months.  She is single, never been pregnant.  She is a Administrator, used to work for [employer], and is currently looking for new job.    GYN HISTORY  Menarchal: 11 LMP: 45 Contraceptive: no HRT: no  G0P0:    REVIEW OF SYSTEMS:    Constitutional: Denies fevers, chills or abnormal night sweats Eyes: Denies blurriness of vision, double vision or watery eyes Ears, nose, mouth, throat, and face: Denies mucositis or sore throat Respiratory: Denies cough, dyspnea or wheezes Cardiovascular: Denies palpitation, chest discomfort or lower extremity swelling Gastrointestinal:  Denies nausea, heartburn or change in bowel habits Skin: Denies abnormal skin rashes Lymphatics: Denies new lymphadenopathy or easy bruising Neurological:Denies numbness, tingling or new weaknesses Musculoskeletal: She has mild intermittent low back and SI joint pain Behavioral/Psych: Mood is stable, no new changes  All other systems were reviewed with the patient and are negative.   MEDICAL HISTORY:  Past Medical History:  Diagnosis Date  . Allergy   . Chicken pox   . Diverticulosis   . GERD (gastroesophageal reflux disease)   . Goiter   . Seizures ([place])    resolved    SURGICAL HISTORY: Past Surgical History:  Procedure Laterality Date  . ADENOIDECTOMY  1963  . BREAST EXCISIONAL BIOPSY Left   . BREAST EXCISIONAL BIOPSY Right   . BREAST LUMPECTOMY Left 2009   benign  . BREAST LUMPECTOMY WITH RADIOACTIVE SEED LOCALIZATION Right 03/01/2020   Procedure: RIGHT BREAST LUMPECTOMY WITH RADIOACTIVE SEED LOCALIZATION;  Surgeon: Erroll Luna, MD;  Location: [facility];  Service: General;  Laterality: Right;  . BREAST LUMPECTOMY WITH RADIOACTIVE SEED LOCALIZATION Right 09/20/2020   Procedure: RIGHT BREAST LUMPECTOMY WITH RADIOACTIVE SEED LOCALIZATION;  Surgeon: Erroll Luna, MD;  Location: [facility];  Service: General;  Laterality: Right;  . KNEE SURGERY  2021  . LOBECTOMY FOR SEIZURE FOCUS     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  . MENISECTOMY Left   . SPLENECTOMY  1961  .  TONSILLECTOMY  1963  . WISDOM TOOTH EXTRACTION      SOCIAL HISTORY: Social History   Socioeconomic History  . Marital status: Single    Spouse name: Not on file  . Number of children: 0  . Years of education: Not on file  . Highest education level: Not on file  Occupational History  . Occupation: NP   Tobacco Use  . Smoking status: Former Smoker    Packs/day: 2.00    Years: 29.00    Pack years: 58.00    Types: Cigarettes    Quit date: 10/08/2002    Years since quitting: 18.2  . Smokeless tobacco: Never Used  Vaping Use  . Vaping Use: Never used  Substance and Sexual Activity  . Alcohol use: No  . Drug use: No  . Sexual activity: Not on file  Other Topics Concern  . Not on file  Social History Narrative  . Not on file   Social Determinants of Health   Financial Resource Strain: Not on file  Food Insecurity: Not on file  Transportation Needs: Not on file  Physical Activity: Not on file  Stress: Not on file  Social Connections: Not on file  Intimate Partner Violence: Not on file    FAMILY HISTORY: Family History  Problem Relation Age of Onset  . Pneumonia Mother   . Dementia Father   . Goiter Neg Hx   . Thyroid cancer Neg Hx   . Thyroid nodules Neg Hx   . Thyroid disease Neg Hx     ALLERGIES:  is allergic to cetirizine, metronidazole, quinolones, scopolamine, and penicillins.  MEDICATIONS:  Current Outpatient Medications  Medication Sig Dispense Refill  . Calcium Carbonate-Vit D-Min (CALCIUM 1200 PO) Take 1 tablet by mouth daily.    . Cholecalciferol (VITAMIN D3) 1000 units CAPS Take 1 capsule by mouth daily.    Marland Kitchen CINNAMON PO Take 100 tablets by mouth daily.     . clorazepate (TRANXENE) 3.75 MG tablet Take 1 tablet daily as needed by mouth.    . famotidine (PEPCID) 20 MG tablet Take 20 mg by mouth daily.     . Flaxseed, Linseed, (FLAX SEED OIL PO) Take by mouth.    . fluticasone (FLONASE) 50 MCG/ACT nasal spray Place 1 spray into both nostrils 2 (two)  times daily as needed for allergies or rhinitis. 16 g 5  . ibuprofen (ADVIL) 800 MG tablet Take 1 tablet (800 mg total) by mouth every 8 (eight) hours as needed. 30 tablet 0  . lamoTRIgine (LAMICTAL) 100 MG tablet Take 100 mg by mouth 2 (two) times daily.    Marland Kitchen loratadine (CLARITIN) 10 MG tablet Take 10 mg by mouth daily.    . Magnesium 250 MG TABS Take 1 tablet by mouth daily.    . Multiple Vitamin (MULTIVITAMIN) tablet Take 1 tablet by mouth daily.    . TURMERIC PO Take 1 tablet by mouth daily.    Marland Kitchen UNABLE TO FIND Med Name: Collagen 2 otc once daily    . UNABLE TO FIND Med Name: CLA (over the counter) daily    . Zinc 50 MG CAPS Take 1 capsule by mouth daily.     No current facility-administered medications for this visit.  PHYSICAL EXAMINATION: ECOG PERFORMANCE STATUS: 0 - Asymptomatic  Vitals:   12/15/20 1451  BP: (!) 143/53  Pulse: 94  Resp: 20  Temp: 98.3 F (36.8 C)  SpO2: 99%   Filed Weights   12/15/20 1451  Weight: 215 lb 1.6 oz (97.6 kg)    GENERAL:alert, no distress and comfortable SKIN: skin color, texture, turgor are normal, no rashes or significant lesions EYES: normal, Conjunctiva are pink and non-injected, sclera clear  NECK: supple, thyroid normal size, non-tender, without nodularity LYMPH:  no palpable lymphadenopathy in the cervical, axillary  LUNGS: clear to auscultation and percussion with normal breathing effort HEART: regular rate & rhythm and no murmurs and no lower extremity edema ABDOMEN:abdomen soft, non-tender and normal bowel sounds Musculoskeletal:no cyanosis of digits and no clubbing  NEURO: alert & oriented x 3 with fluent speech, no focal motor/sensory deficits BREAST: Status post lumpectomy in right breast, 2 surgical incisions have healed well. No palpable mass, nodules or adenopathy bilaterally. Breast exam benign.  LABORATORY DATA:  I have reviewed the data as listed CBC Latest Ref Rng & Units 02/26/2020 02/04/2019 04/12/2017  WBC 4.0 -  10.5 K/uL 8.8 12.4(H) 10.0  Hemoglobin 12.0 - 15.0 g/dL 13.4 14.0 14.4  Hematocrit 36.0 - 46.0 % 40.8 41.4 42.6  Platelets 150 - 400 K/uL 322 327.0 346.0    CMP Latest Ref Rng & Units 02/26/2020 12/25/2017 04/12/2017  Glucose 70 - 99 mg/dL 128(H) 117(H) 99  BUN 8 - 23 mg/dL 15 18 16   Creatinine 0.44 - 1.00 mg/dL 1.06(H) 0.95 0.98  Sodium 135 - 145 mmol/L 139 141 138  Potassium 3.5 - 5.1 mmol/L 4.0 4.7 4.2  Chloride 98 - 111 mmol/L 105 105 102  CO2 22 - 32 mmol/L 24 24 28   Calcium 8.9 - 10.3 mg/dL 9.2 9.9 9.5  Total Protein 6.5 - 8.1 g/dL 6.8 - -  Total Bilirubin 0.3 - 1.2 mg/dL 0.8 - -  Alkaline Phos 38 - 126 U/L 63 - -  AST 15 - 41 U/L 19 - -  ALT 0 - 44 U/L 19 - -    Mammogram 12/28/19  IMPRESSION: 1. Suspicious 3x3x4 mm mass involving the UPPER OUTER QUADRANT of the RIGHT breast at the 10 o'clock position approximately 3 cm from the nipple, likely the sonographic correlate for the architectural distortion identified on screening mammography. 2. No pathologic RIGHT axillary lymphadenopathy.    Diagnosis 01/01/20 Breast, right, needle core biopsy, 10 o'clock, 3cmfn - COMPLEX SCLEROSING LESION. - SEE COMMENT. Microscopic Comment The results are called to [facility] on 01/04/20. (JBK:gt, 01/04/20)   RIGHT BREAST LUMPECTOMY WITH RADIOACTIVE SEED LOCALIZATION 03/01/20 by Dr Brantley Stage FINAL MICROSCOPIC DIAGNOSIS:  A. BREAST, RIGHT, LUMPECTOMY:  - Atypical ductal hyperplasia arising in complex sclerosing lesion  - No malignancy identified     Breast MRI 07/18/20 IMPRESSION: 1. Two indeterminate 6x6x9 mm masses in the RIGHT breast. One of the masses is located in the INNER breast at MIDDLE depth at the 3 o'clock location and the other is in the slight INNER subareolar location at the level of the nipple. 2. No MRI evidence of malignancy involving the LEFT breast. 3. No pathologic lymphadenopathy.     Diagnosis 07/29/20  Breast, right, needle core  biopsy - COMPLEX SCLEROSING LESION WITH ATYPICAL DUCTAL HYPERPLASIA AND CALCIFICATIONS. - SEE MICROSCOPIC DESCRIPTION Microscopic Comment The atypical ductal hyperplasia has low grade cytologic atypia. Immunohistochemistry for cytokeratin 5/6, calponin, p63 and smooth muscle myosin supports the diagnosis.  RIGHT BREAST LUMPECTOMY WITH RADIOACTIVE SEED LOCALIZATION by Dr Brantley Stage 09/20/20 FINAL MICROSCOPIC DIAGNOSIS: 09/20/20 A. BREAST, RIGHT, LUMPECTOMY:  - Focal atypical ductal hyperplasia, see comment  - Residual complex sclerosing lesion  - Fibrocystic change with adenosis, usual ductal hyperplasia and  calcifications  - Biopsy site changes  COMMENT:  Dr. Tresa Moore reviewed the case and concurs with the diagnosis.    RADIOGRAPHIC STUDIES: I have personally reviewed the radiological images as listed and agreed with the findings in the report. No results found.  ASSESSMENT & PLAN:  Desiree Snow is a 70 y.o. Caucasian female with a history of seizure, Diverticulosis, GERD, Goiter   1. Right breast atypical ductal hyperplasia in 12/2019 and 07/2020, status post lumpectomy twice -I discussed her imaging findings and surgical pathology results with her in great details.  -She first was diagnosed with right breast ADH after abnormal 12/2019 Mammogram and 03/01/20 Right lumpectomy with Dr Brantley Stage.  -During her close breast cancer screening, she was seen to have 64mm mass in right breast on 07/18/20 MRI. 07/29/20 biopsy and 09/20/20 path from right lumpectomy show right breast ADH again.    -We reviewed the natural history of atypical hyperplasia. It is consider a benign breast disease, however it does increase the risk of breast cancer by 3-5 fold. It is considered as a high risk for breast cancer. -We discussed annual screening mammogram, which will detect early stage breast cancer. She agrees to continue. She is very compliant on screening.  Due to her high risk disease, I also  recommend her to continue annual screening breast MRI, she is agreeable  -We also discussed healthy diet and regular exercise, calcium and vitamin D supplement, to reduce her risk of breast cancer. -We further discussed chemoprevention to breast cancer. I discussed the option of tamoxifen, raloxifene and anastrozole. These endocrine therapy agent is likely going to reduce the risk of breast cancer by 30-40%, however there is no data of survival benefit so far.  -The different side effects of this 3 agents were discussed with patient in great details.  -She is concerned about risk of thrombosis and endometrial cancer from tamoxifen, and she has opted anastrozole 2 months ago.  She is tolerating well so far.  I encouraged her to continue for a total of 5 years. -I encouraged her to continue follow-up with Dr. Brantley Stage and me, we will split her visits    2. Osteopenia  -Her 01/31/18 DEXA showed osteopenia with lowest T-score -1.2 in right femur neck.  -We discussed risk of worsening osteopenia from anastrozole, I strongly encouraged her to have weightbearing exercise, and continue calcium and vitamin D supplement -We will continue DEXA scan every 2 years for close monitoring.  3.  History of seizure, GERD, thyroid disease -Continue medication and follow-up with PCP   PLAN:  -she will continue anastrozole  -She is due for mammogram, I ordered for her -Lab and follow-up in 4 months, then yearly. She will continue to f/u with Dr. Alena Bills also    Orders Placed This Encounter  Procedures  . MM Digital Screening    Standing Status:   Future    Standing Expiration Date:   12/15/2021    Order Specific Question:   Reason for Exam (SYMPTOM  OR DIAGNOSIS REQUIRED)    Answer:   screening    Order Specific Question:   Preferred imaging location?    Answer:   [facility]    All questions were answered. The patient knows to call the  clinic with any problems, questions or concerns. The total time  spent in the appointment was 45 minutes.     Truitt Merle, MD 12/15/2020 10:50 PM  I, Joslyn Devon, am acting as scribe for Truitt Merle, MD.   I have reviewed the above documentation for accuracy and completeness, and I agree with the above.

## 2020-12-15 ENCOUNTER — Encounter: Payer: Self-pay | Admitting: Hematology

## 2020-12-15 ENCOUNTER — Other Ambulatory Visit: Payer: Self-pay

## 2020-12-15 ENCOUNTER — Inpatient Hospital Stay: Payer: Medicare Other | Attending: Hematology | Admitting: Hematology

## 2020-12-15 VITALS — BP 143/53 | HR 94 | Temp 98.3°F | Resp 20 | Ht 66.0 in | Wt 215.1 lb

## 2020-12-15 DIAGNOSIS — Z1231 Encounter for screening mammogram for malignant neoplasm of breast: Secondary | ICD-10-CM

## 2020-12-15 DIAGNOSIS — K219 Gastro-esophageal reflux disease without esophagitis: Secondary | ICD-10-CM | POA: Diagnosis not present

## 2020-12-15 DIAGNOSIS — N6091 Unspecified benign mammary dysplasia of right breast: Secondary | ICD-10-CM | POA: Diagnosis not present

## 2020-12-15 DIAGNOSIS — E079 Disorder of thyroid, unspecified: Secondary | ICD-10-CM

## 2020-12-15 DIAGNOSIS — Z79811 Long term (current) use of aromatase inhibitors: Secondary | ICD-10-CM

## 2020-12-15 DIAGNOSIS — M85851 Other specified disorders of bone density and structure, right thigh: Secondary | ICD-10-CM | POA: Diagnosis not present

## 2021-01-30 ENCOUNTER — Encounter: Payer: Self-pay | Admitting: *Deleted

## 2021-02-01 ENCOUNTER — Encounter: Payer: Self-pay | Admitting: Diagnostic Neuroimaging

## 2021-02-01 ENCOUNTER — Other Ambulatory Visit: Payer: Self-pay

## 2021-02-01 ENCOUNTER — Ambulatory Visit: Payer: Medicare Other | Admitting: Diagnostic Neuroimaging

## 2021-02-01 VITALS — BP 133/76 | HR 81 | Ht 65.75 in | Wt 209.0 lb

## 2021-02-01 DIAGNOSIS — G40909 Epilepsy, unspecified, not intractable, without status epilepticus: Secondary | ICD-10-CM

## 2021-02-01 MED ORDER — LAMOTRIGINE 100 MG PO TABS
100.0000 mg | ORAL_TABLET | Freq: Two times a day (BID) | ORAL | 4 refills | Status: DC
Start: 2021-02-01 — End: 2022-02-05

## 2021-02-01 NOTE — Progress Notes (Signed)
[facility]  PATIENT: Desiree Snow DOB: [birth year]  REFERRING CLINICIAN: Lonell Face, MD HISTORY FROM: patient  REASON FOR VISIT: new consult    HISTORICAL  CHIEF COMPLAINT:  Chief Complaint  Patient presents with  . Seizures    Rm 6 New Pt     HISTORY OF PRESENT ILLNESS:   70 year old female here for evaluation of seizure disorder.  Patient had onset of seizures in 1963, diagnosed in 1965.  At that time patient was living in [place].  She was having weird sensations initially and then had complex partial seizures where she lost awareness.  She would still continue to be able to walk and move but would have memory lapse.  She was started on Dilantin, primidone, Tegretol without relief.  Patient continued to have seizures at least 2-3 times per month.  Eventually she had video EEG monitoring and underwent epilepsy surgery in 1998 at medical [facility].  Since that time patient has done extremely well and has not had any major seizures.  She rarely has brief ROS but does not have alteration of consciousness.  Patient was maintained on Keppra and Lamictal for a while after her seizure surgery, and then reduce to Keppra alone.  She noticed some issues with mood and therefore transitioned from Keppra to Lamictal in 2021.  Since that time patient is doing well.  Patient previously was seeing neurologist in [facility] but requested transfer to care.  [place] where she lives.  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  She is currently not working but is looking for employment.   REVIEW OF SYSTEMS: Full 14 system review of systems performed and negative with exception of: As per HPI.  ALLERGIES: Allergies  Allergen Reactions  . Cetirizine Other (See Comments)    Cognitive issues and "lowers seizure threshold."  .  Metronidazole Other (See Comments)    Strange feeling and inability to function  . Quinolones   . Scopolamine Hives     on her fingers no itchng  . Penicillins Rash    HOME MEDICATIONS: Outpatient Medications Prior to Visit  Medication Sig Dispense Refill  . Calcium Carbonate-Vit D-Min (CALCIUM 1200 PO) Take 1 tablet by mouth daily.    . Cholecalciferol (VITAMIN D3) 1000 units CAPS Take 1 capsule by mouth daily.    Marland Kitchen CINNAMON PO Take 100 tablets by mouth daily.     . clorazepate (TRANXENE) 3.75 MG tablet Take 1 tablet daily as needed by mouth.    . famotidine (PEPCID) 20 MG tablet Take 20 mg by mouth daily.     . Flaxseed, Linseed, (FLAX SEED OIL PO) Take by mouth.    . fluticasone (FLONASE) 50 MCG/ACT nasal spray Place 1 spray into both nostrils 2 (two) times daily as needed for allergies or rhinitis. 16 g 5  . ibuprofen (ADVIL) 800 MG tablet Take 1 tablet (800 mg total) by mouth every 8 (eight) hours as needed. 30 tablet 0  . Magnesium 250 MG TABS Take 1 tablet by mouth daily.    . Multiple Vitamin (MULTIVITAMIN) tablet Take 1 tablet by mouth daily.    . TURMERIC PO Take 1 tablet by mouth daily.    Marland Kitchen UNABLE TO FIND Med Name: Collagen 2 otc once daily    . UNABLE TO FIND Med Name: CLA (over the counter) daily    . Zinc 50 MG  CAPS Take 1 capsule by mouth daily.    Marland Kitchen lamoTRIgine (LAMICTAL) 100 MG tablet Take 100 mg by mouth 2 (two) times daily.    Marland Kitchen loratadine (CLARITIN) 10 MG tablet Take 10 mg by mouth daily.     No facility-administered medications prior to visit.    PAST MEDICAL HISTORY: Past Medical History:  Diagnosis Date  . Allergy   . Chicken pox   . Diverticulosis   . GERD (gastroesophageal reflux disease)   . Goiter   . Seizures ([place])    "resolved" last seizure in 2002    PAST SURGICAL HISTORY: Past Surgical History:  Procedure Laterality Date  . ADENOIDECTOMY  1963  . BREAST EXCISIONAL BIOPSY Left   . BREAST EXCISIONAL BIOPSY Right   . BREAST LUMPECTOMY Left  2009   benign  . BREAST LUMPECTOMY WITH RADIOACTIVE SEED LOCALIZATION Right 03/01/2020   Procedure: RIGHT BREAST LUMPECTOMY WITH RADIOACTIVE SEED LOCALIZATION;  Surgeon: Erroll Luna, MD;  Location: [place];  Service: General;  Laterality: Right;  . BREAST LUMPECTOMY WITH RADIOACTIVE SEED LOCALIZATION Right 09/20/2020   Procedure: RIGHT BREAST LUMPECTOMY WITH RADIOACTIVE SEED LOCALIZATION;  Surgeon: Erroll Luna, MD;  Location: [place];  Service: General;  Laterality: Right;  . KNEE SURGERY  2021  . LOBECTOMY FOR SEIZURE FOCUS  1998   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  . MENISECTOMY Left   . SPLENECTOMY  1961  . TONSILLECTOMY  1963  . WISDOM TOOTH EXTRACTION      FAMILY HISTORY: Family History  Problem Relation Age of Onset  . Pneumonia Mother   . Dementia Father   . Hypertension Maternal Grandmother   . Stroke Maternal Grandmother   . Dementia Paternal Grandfather   . Goiter Neg Hx   . Thyroid cancer Neg Hx   . Thyroid nodules Neg Hx   . Thyroid disease Neg Hx     SOCIAL HISTORY: Social History   Socioeconomic History  . Marital status: Single    Spouse name: Not on file  . Number of children: 0  . Years of education: Not on file  . Highest education level: Master's degree (e.g., MA, MS, MEng, MEd, MSW, MBA)  Occupational History  . Occupation: NP     Comment: NA  Tobacco Use  . Smoking status: Former Smoker    Packs/day: 2.00    Years: 29.00    Pack years: 58.00    Types: Cigarettes    Quit date: 10/08/2002    Years since quitting: 18.3  . Smokeless tobacco: Never Used  Vaping Use  . Vaping Use: Never used  Substance and Sexual Activity  . Alcohol use: No  . Drug use: No  . Sexual activity: Not on file  Other Topics Concern  . Not on file  Social History Narrative   Lives alone   Caffeine- 2 c coffee   Social Determinants of Health   Financial Resource Strain: Not on file  Food  Insecurity: Not on file  Transportation Needs: Not on file  Physical Activity: Not on file  Stress: Not on file  Social Connections: Not on file  Intimate Partner Violence: Not on file     PHYSICAL EXAM  GENERAL EXAM/CONSTITUTIONAL: Vitals:  Vitals:   02/01/21 1010  BP: 133/76  Pulse: 81  Weight: 209 lb (94.8 kg)  Height: 5' 5.75" (1.67 m)   Body mass index is 33.99 kg/m. Wt Readings from Last 3 Encounters:  02/01/21 209 lb (94.8 kg)  12/15/20 215 lb 1.6 oz (97.6 kg)  09/20/20 213 lb 10 oz (96.9 kg)    Patient is in no distress; well developed, nourished and groomed; neck is supple  CARDIOVASCULAR:  Examination of carotid arteries is normal; no carotid bruits  Regular rate and rhythm, no murmurs  Examination of peripheral vascular system by observation and palpation is normal  EYES:  Ophthalmoscopic exam of optic discs and posterior segments is normal; no papilledema or hemorrhages No exam data present  MUSCULOSKELETAL:  Gait, strength, tone, movements noted in Neurologic exam below  NEUROLOGIC: MENTAL STATUS:  No flowsheet data found.  awake, alert, oriented to person, place and time  recent and remote memory intact  normal attention and concentration  language fluent, comprehension intact, naming intact  fund of knowledge appropriate  CRANIAL NERVE:   2nd - no papilledema on fundoscopic exam  2nd, 3rd, 4th, 6th - pupils equal and reactive to light, visual fields full to confrontation, extraocular muscles intact, no nystagmus  5th - facial sensation symmetric  7th - facial strength symmetric  8th - hearing intact  9th - palate elevates symmetrically, uvula midline  11th - shoulder shrug symmetric  12th - tongue protrusion midline  MOTOR:   normal bulk and tone, full strength in the BUE, BLE  SENSORY:   normal and symmetric to light touch, temperature, vibration  COORDINATION:   finger-nose-finger, fine finger movements  normal  REFLEXES:   deep tendon reflexes present and symmetric  GAIT/STATION:   narrow based gait     DIAGNOSTIC DATA (LABS, IMAGING, TESTING) - I reviewed patient records, labs, notes, testing and imaging myself where available.  Lab Results  Component Value Date   WBC 8.8 02/26/2020   HGB 13.4 02/26/2020   HCT 40.8 02/26/2020   MCV 94.9 02/26/2020   PLT 322 02/26/2020      Component Value Date/Time   NA 139 02/26/2020 1200   NA 140 11/22/2016 0000   K 4.0 02/26/2020 1200   CL 105 02/26/2020 1200   CO2 24 02/26/2020 1200   GLUCOSE 128 (H) 02/26/2020 1200   BUN 15 02/26/2020 1200   BUN 16 11/22/2016 0000   CREATININE 1.06 (H) 02/26/2020 1200   CALCIUM 9.2 02/26/2020 1200   PROT 6.8 02/26/2020 1200   ALBUMIN 3.5 02/26/2020 1200   AST 19 02/26/2020 1200   ALT 19 02/26/2020 1200   ALKPHOS 63 02/26/2020 1200   BILITOT 0.8 02/26/2020 1200   GFRNONAA 54 (L) 02/26/2020 1200   GFRAA >60 02/26/2020 1200   Lab Results  Component Value Date   CHOL 187 12/25/2017   HDL 50.90 12/25/2017   LDLCALC 104 (H) 12/25/2017   TRIG 158.0 (H) 12/25/2017   CHOLHDL 4 12/25/2017   Lab Results  Component Value Date   HGBA1C 6.4 12/25/2017   No results found for: [id number] Lab Results  Component Value Date   TSH 1.46 07/12/2020    09/18/19 MRI brain 1. Right temporal lobectomy without evidence for residual or recurrent tumor. 2. Otherwise normal MRI appearance of the brain.    ASSESSMENT AND PLAN  70 y.o. year old female here with:  Dx:  1. Seizure disorder ([place])      PLAN:  RIGHT TEMPORAL LOBE SEIZURES (s/p temporal lobectomy and amygdalohippocampectomy, 1998; last seizure ~1998) - doing well; continue lamotrigine 100mg  twice a day  Meds ordered this encounter  Medications  . lamoTRIgine (LAMICTAL) 100 MG tablet    Sig: Take 1 tablet (100 mg total) by mouth  2 (two) times daily.    Dispense:  180 tablet    Refill:  4   Return in about 1 year (around  02/01/2022).    Penni Bombard, MD 9/39/0300, 92:33 AM Certified in Neurology, Neurophysiology and Neuroimaging  [facility] [address], [place], [place] [address] [phone]

## 2021-02-08 ENCOUNTER — Other Ambulatory Visit: Payer: Self-pay

## 2021-02-08 ENCOUNTER — Ambulatory Visit
Admission: RE | Admit: 2021-02-08 | Discharge: 2021-02-08 | Disposition: A | Discharge status: Home / Self Care | Payer: Medicare Other | Source: Ambulatory Visit | Attending: Hematology | Admitting: Hematology

## 2021-02-08 DIAGNOSIS — Z1231 Encounter for screening mammogram for malignant neoplasm of breast: Secondary | ICD-10-CM

## 2021-02-09 ENCOUNTER — Encounter: Payer: Self-pay | Admitting: Hematology

## 2021-04-08 ENCOUNTER — Other Ambulatory Visit: Payer: Self-pay | Admitting: Hematology

## 2021-04-15 ENCOUNTER — Encounter (HOSPITAL_COMMUNITY): Payer: Self-pay

## 2021-04-20 ENCOUNTER — Encounter: Payer: Self-pay | Admitting: Hematology

## 2021-04-20 ENCOUNTER — Inpatient Hospital Stay: Payer: Medicare Other | Admitting: Hematology

## 2021-04-20 ENCOUNTER — Other Ambulatory Visit: Payer: Self-pay

## 2021-04-20 ENCOUNTER — Inpatient Hospital Stay: Payer: Medicare Other | Attending: Hematology

## 2021-04-20 VITALS — BP 125/77 | HR 79 | Temp 98.4°F | Resp 20 | Ht 65.75 in | Wt 205.7 lb

## 2021-04-20 DIAGNOSIS — N6091 Unspecified benign mammary dysplasia of right breast: Secondary | ICD-10-CM

## 2021-04-20 DIAGNOSIS — Z79811 Long term (current) use of aromatase inhibitors: Secondary | ICD-10-CM | POA: Insufficient documentation

## 2021-04-20 DIAGNOSIS — M8580 Other specified disorders of bone density and structure, unspecified site: Secondary | ICD-10-CM | POA: Diagnosis not present

## 2021-04-20 LAB — COMPREHENSIVE METABOLIC PANEL
ALT: 13 U/L (ref 0–44)
AST: 12 U/L — ABNORMAL LOW (ref 15–41)
Albumin: 3.5 g/dL (ref 3.5–5.0)
Alkaline Phosphatase: 63 U/L (ref 38–126)
Anion gap: 7 (ref 5–15)
BUN: 14 mg/dL (ref 8–23)
CO2: 27 mmol/L (ref 22–32)
Calcium: 9.6 mg/dL (ref 8.9–10.3)
Chloride: 104 mmol/L (ref 98–111)
Creatinine, Ser: 1 mg/dL (ref 0.44–1.00)
GFR, Estimated: >60 mL/min (ref >60)
Glucose, Bld: 105 mg/dL — ABNORMAL HIGH (ref 70–99)
Potassium: 4.6 mmol/L (ref 3.5–5.1)
Sodium: 138 mmol/L (ref 135–145)
Total Bilirubin: 0.4 mg/dL (ref 0.3–1.2)
Total Protein: 7.1 g/dL (ref 6.5–8.1)

## 2021-04-20 LAB — CBC WITH DIFFERENTIAL (CANCER CENTER ONLY)
Abs Immature Granulocytes: 0.02 10*3/uL (ref 0.00–0.07)
Basophils Absolute: 0.1 10*3/uL (ref 0.0–0.1)
Basophils Relative: 1 %
Eosinophils Absolute: 0.2 10*3/uL (ref 0.0–0.5)
Eosinophils Relative: 2 %
HCT: 40.5 % (ref 36.0–46.0)
Hemoglobin: 13.3 g/dL (ref 12.0–15.0)
Immature Granulocytes: 0 %
Lymphocytes Relative: 29 %
Lymphs Abs: 2.9 10*3/uL (ref 0.7–4.0)
MCH: 31.6 pg (ref 26.0–34.0)
MCHC: 32.8 g/dL (ref 30.0–36.0)
MCV: 96.2 fL (ref 80.0–100.0)
Monocytes Absolute: 1.1 10*3/uL — ABNORMAL HIGH (ref 0.1–1.0)
Monocytes Relative: 11 %
Neutro Abs: 5.6 10*3/uL (ref 1.7–7.7)
Neutrophils Relative %: 57 %
Platelet Count: 323 10*3/uL (ref 150–400)
RBC: 4.21 MIL/uL (ref 3.87–5.11)
RDW: 13 % (ref 11.5–15.5)
WBC Count: 9.8 10*3/uL (ref 4.0–10.5)
nRBC: 0 % (ref 0.0–0.2)

## 2021-04-20 NOTE — Progress Notes (Signed)
[place]   Telephone:([phone]) [phone] Fax:(336) [phone]   Clinic Follow up Note   Patient Care Team: [name], [name], MD as PCP - General (Internal Medicine) Truitt Merle, MD as Consulting Physician (Hematology) Erroll Luna, MD as Consulting Physician (General Surgery)  Date of Service:  04/20/2021  CHIEF COMPLAINT: f/u of high risk for breast cancer  CURRENT THERAPY:  Anastrozole, started 10/2020  INTERVAL HISTORY:  Desiree Snow is here for a follow up of high risk. She was last seen by me on 12/15/20. She presents to the clinic alone. She denies joint pain, hot flashes, or mood changes. She notes pain to her SI joint, which she has been aware of previously and receives injections for. She also notes she has lost weight, which she relates to moving.   All other systems were reviewed with the patient and are negative.  MEDICAL HISTORY:  Past Medical History:  Diagnosis Date   Allergy    Chicken pox    Diverticulosis    GERD (gastroesophageal reflux disease)    Goiter    H/O febrile seizure    as infant, x 1, List of AEDs tried: Dilantin, Mysoline, Tegretol, Gabapentine, Depakote,  Keppra, and Lamictal,   Seizures ([place])    "resolved" last seizure in 2002    SURGICAL HISTORY: Past Surgical History:  Procedure Laterality Date   [place] Right 07/29/2020   COMPLEX SCLEROSING LESION WITH ATYPICAL DUCTAL   BREAST BIOPSY Right 01/01/2020   COMPLEX SCLEROSING LESION    BREAST EXCISIONAL BIOPSY Right 09/20/2020   complex sclerosing   BREAST EXCISIONAL BIOPSY Right 03/01/2020   [facility] IDC, CSL also considered   BREAST EXCISIONAL BIOPSY Left    ? Date  ?lipoma   BREAST LUMPECTOMY Left 2009   benign   BREAST LUMPECTOMY WITH RADIOACTIVE SEED LOCALIZATION Right 03/01/2020   Procedure: RIGHT BREAST LUMPECTOMY WITH RADIOACTIVE SEED LOCALIZATION;  Surgeon: Erroll Luna, MD;  Location: [facility];  Service: General;   Laterality: Right;   BREAST LUMPECTOMY WITH RADIOACTIVE SEED LOCALIZATION Right 09/20/2020   Procedure: RIGHT BREAST LUMPECTOMY WITH RADIOACTIVE SEED LOCALIZATION;  Surgeon: Erroll Luna, MD;  Location: [facility];  Service: General;  Laterality: Right;   KNEE SURGERY  2021   [place]   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   MENISECTOMY Left    [place] EXTRACTION      I have reviewed the social history and family history with the patient and they are unchanged from previous note.  ALLERGIES:  is allergic to cetirizine, metronidazole, quinolones, scopolamine, and penicillins.  MEDICATIONS:  Current Outpatient Medications  Medication Sig Dispense Refill   anastrozole (ARIMIDEX) 1 MG tablet TAKE 1 TABLET BY MOUTH EVERY DAY 90 tablet 1   Calcium Carbonate-Vit D-Min (CALCIUM 1200 PO) Take 1 tablet by mouth daily.     Cholecalciferol (VITAMIN D3) 1000 units CAPS Take 1 capsule by mouth daily.     CINNAMON PO Take 100 tablets by mouth daily.      clorazepate (TRANXENE) 3.75 MG tablet Take 1 tablet daily as needed by mouth.     famotidine (PEPCID) 20 MG tablet Take 20 mg by mouth daily.      Flaxseed, Linseed, (FLAX SEED OIL PO) Take by mouth.     fluticasone (FLONASE) 50 MCG/ACT nasal spray Place 1 spray into both nostrils 2 (two) times  daily as needed for allergies or rhinitis. 16 g 5   ibuprofen (ADVIL) 800 MG tablet Take 1 tablet (800 mg total) by mouth every 8 (eight) hours as needed. 30 tablet 0   lamoTRIgine (LAMICTAL) 100 MG tablet Take 1 tablet (100 mg total) by mouth 2 (two) times daily. 180 tablet 4   Magnesium 250 MG TABS Take 1 tablet by mouth daily.     Multiple Vitamin (MULTIVITAMIN) tablet Take 1 tablet by mouth daily.     TURMERIC PO Take 1 tablet by mouth daily.     UNABLE TO FIND Med Name: Collagen 2 otc once daily     UNABLE TO FIND Med Name: CLA (over the  counter) daily     Zinc 50 MG CAPS Take 1 capsule by mouth daily.     No current facility-administered medications for this visit.    PHYSICAL EXAMINATION: ECOG PERFORMANCE STATUS: 0 - Asymptomatic  Vitals:   04/20/21 1026 04/20/21 1106  BP: (!) 153/70 125/77  Pulse: 79   Resp: 20   Temp: 98.4 F (36.9 C)   SpO2: 100%    Filed Weights   04/20/21 1026  Weight: 205 lb 11.2 oz (93.3 kg)    GENERAL:alert, no distress and comfortable SKIN: skin color, texture, turgor are normal, no rashes or significant lesions EYES: normal, Conjunctiva are pink and non-injected, sclera clear  NECK: supple, thyroid normal size, non-tender, without nodularity LYMPH:  no palpable lymphadenopathy in the cervical, axillary  LUNGS: clear to auscultation and percussion with normal breathing effort HEART: regular rate & rhythm and no murmurs and no lower extremity edema ABDOMEN:abdomen soft, non-tender and normal bowel sounds Musculoskeletal:no cyanosis of digits and no clubbing  NEURO: alert & oriented x 3 with fluent speech, no focal motor/sensory deficits BREAST: scar tissue present at incision site on right breast. No palpable mass, nodules or adenopathy bilaterally. Breast exam benign.   LABORATORY DATA:  I have reviewed the data as listed CBC Latest Ref Rng & Units 04/20/2021 02/26/2020 02/04/2019  WBC 4.0 - 10.5 K/uL 9.8 8.8 12.4(H)  Hemoglobin 12.0 - 15.0 g/dL 13.3 13.4 14.0  Hematocrit 36.0 - 46.0 % 40.5 40.8 41.4  Platelets 150 - 400 K/uL 323 322 327.0     CMP Latest Ref Rng & Units 04/20/2021 02/26/2020 12/25/2017  Glucose 70 - 99 mg/dL 105(H) 128(H) 117(H)  BUN 8 - 23 mg/dL 14 15 18   Creatinine 0.44 - 1.00 mg/dL 1.00 1.06(H) 0.95  Sodium 135 - 145 mmol/L 138 139 141  Potassium 3.5 - 5.1 mmol/L 4.6 4.0 4.7  Chloride 98 - 111 mmol/L 104 105 105  CO2 22 - 32 mmol/L 27 24 24   Calcium 8.9 - 10.3 mg/dL 9.6 9.2 9.9  Total Protein 6.5 - 8.1 g/dL 7.1 6.8 -  Total Bilirubin 0.3 - 1.2 mg/dL 0.4  0.8 -  Alkaline Phos 38 - 126 U/L 63 63 -  AST 15 - 41 U/L 12(L) 19 -  ALT 0 - 44 U/L 13 19 -     RADIOGRAPHIC STUDIES: I have personally reviewed the radiological images as listed and agreed with the findings in the report. No results found.   ASSESSMENT & PLAN:  Desiree Snow is a 70 y.o. female with   1. 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 -During her close breast cancer screening, she was  seen to have 60mm mass in right breast on 07/18/20 MRI. 07/29/20 biopsy and 09/20/20 path from right lumpectomy show right breast ADH again.   -She was started on prophylactic anastrozole in 10/2020. Tolerating well with no noticeable side effects. -Her most recent mammogram on 02/08/21 was negative. -We again discussed the benefit of anastrozole, to reduce the risk of breast cancer by approximately 40-50%, however there is no data of survival benefit so far. She questioned why only for 5 years. I reviewed that this is what the studies have showed, with the maximum benefit occurring within 5 years. -We also reviewed continuing with annual screening mammogram with annual screening breast MRI 6 months apart. She is enthusiastic about proceeding. -f/u in 6 months then yearly    2. Osteopenia -Her 01/31/18 DEXA showed osteopenia with lowest T-score -1.2 in right femur neck.  -We discussed risk of worsening osteopenia from anastrozole, I strongly encouraged her to have weightbearing exercise, and continue calcium and vitamin D supplement -We will continue DEXA scan every 2 years for close monitoring.  3. Cancer screening: History of smoking, family history of lung and uterine cancer -She quit smoking in 2004. She was previously followed with screening chest CT, completed 15 years of follow up in 2019. -She expressed concern  since a paternal cousin recently passed away from lung cancer. Her aunt had uterine cancer  -I discussed that we have genetic counselors here, and I am happy to refer her. Her insurance may not cover her genetic testing but she is willing to do selfpay    4. History of seizure, GERD, thyroid disease -Continue medication and follow-up with PCP     PLAN:  -referral to genetics  -continue anastrozole -will order screening breast MRI for 07/2021 -labs and f/u in 6 months   No problem-specific Assessment & Plan notes found for this encounter.   Orders Placed This Encounter  Procedures   MR BREAST BILATERAL W WO CONTRAST INC CAD    Standing Status:   Future    Standing Expiration Date:   04/20/2022    Order Specific Question:   If indicated for the ordered procedure, I authorize the administration of contrast media per Radiology protocol    Answer:   Yes    Order Specific Question:   What is the patient's sedation requirement?    Answer:   No Sedation    Order Specific Question:   Does the patient have a pacemaker or implanted devices?    Answer:   No    Order Specific Question:   Radiology Contrast Protocol - do NOT remove file path    Answer:   [email or web address]    Order Specific Question:   Preferred imaging location?    Answer:   [facility] (table limit-550lbs)   Ambulatory referral to Genetics    Referral Priority:   Routine    Referral Type:   Consultation    Referral Reason:   Specialty Services Required    Number of Visits Requested:   1   All questions were answered. The patient knows to call the clinic with any problems, questions or concerns. No barriers to learning was detected. The total time spent in the appointment was 30 minutes.     Truitt Merle, MD 04/20/2021   I, Wilburn Mylar, am acting as scribe for Truitt Merle, MD.   I have reviewed the above documentation for accuracy and completeness, and I agree with the  above.

## 2021-05-04 ENCOUNTER — Inpatient Hospital Stay: Payer: Medicare Other

## 2021-05-04 ENCOUNTER — Other Ambulatory Visit: Payer: Self-pay

## 2021-05-04 ENCOUNTER — Other Ambulatory Visit: Payer: Self-pay | Admitting: Genetic Counselor

## 2021-05-04 ENCOUNTER — Inpatient Hospital Stay (HOSPITAL_BASED_OUTPATIENT_CLINIC_OR_DEPARTMENT_OTHER): Payer: Medicare Other | Admitting: Genetic Counselor

## 2021-05-04 DIAGNOSIS — Z8049 Family history of malignant neoplasm of other genital organs: Secondary | ICD-10-CM

## 2021-05-04 LAB — GENETIC SCREENING ORDER

## 2021-05-05 ENCOUNTER — Encounter: Payer: Self-pay | Admitting: Genetic Counselor

## 2021-05-05 DIAGNOSIS — Z8049 Family history of malignant neoplasm of other genital organs: Secondary | ICD-10-CM

## 2021-05-05 HISTORY — DX: Family history of malignant neoplasm of other genital organs: Z80.49

## 2021-05-05 NOTE — Progress Notes (Signed)
REFERRING PROVIDER: Truitt Merle, MD [place],  [place] [address]  PRIMARY PROVIDER:  [name], [name], MD  PRIMARY REASON FOR VISIT:  1. Family history of uterine cancer     HISTORY OF PRESENT ILLNESS:   Desiree Snow, a 70 y.o. female, was seen for a [facility] cancer genetics consultation at the request of Dr. Burr Medico due to a family history of cancer.  Desiree Snow presents to clinic today to discuss the possibility of a hereditary predisposition to cancer, to discuss genetic testing, and to further clarify her future cancer risks, as well as potential cancer risks for family members.   Desiree Snow is a 70 y.o. female with no personal history of cancer.  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  By MRI, she was found to have a mass on her right breast later that year, which also showed right breast ADH after lumpectomy. She is being followed in the high risk clinic by Dr. Burr Medico, with plans to continue annual mammograms and breast MRIs in addition to prophylactic anastrozole for five years.   CANCER HISTORY:  Oncology History Overview Note  Cancer Staging No matching staging information was found for the patient.       RISK FACTORS:  Menarche was at age 7.  Nulliparous.  OCP use for approximately 0 years.  Ovaries intact: yes.  Hysterectomy: no.  Menopausal status: postmenopausal.  HRT use: 0 years. Colonoscopy: yes;  most recent in 2018; history of less than 5 colon polyps . Mammogram within the last year: yes. Up to date with pelvic exams: yes. Any excessive radiation exposure in the past: no  Past Medical History:  Diagnosis Date   Allergy    Chicken pox    Diverticulosis    Family history of uterine cancer 05/05/2021   GERD (gastroesophageal reflux disease)    Goiter    H/O febrile seizure    as infant, x 1, List of AEDs tried: Dilantin, Mysoline,  Tegretol, Gabapentine, Depakote,  Keppra, and Lamictal,   Seizures ([place])    "resolved" last seizure in 2002    Past Surgical History:  Procedure Laterality Date   [place] Right 07/29/2020   COMPLEX SCLEROSING LESION WITH ATYPICAL DUCTAL   BREAST BIOPSY Right 01/01/2020   COMPLEX SCLEROSING LESION    BREAST EXCISIONAL BIOPSY Right 09/20/2020   complex sclerosing   BREAST EXCISIONAL BIOPSY Right 03/01/2020   [facility] IDC, CSL also considered   BREAST EXCISIONAL BIOPSY Left    ? Date  ?lipoma   BREAST LUMPECTOMY Left 2009   benign   BREAST LUMPECTOMY WITH RADIOACTIVE SEED LOCALIZATION Right 03/01/2020   Procedure: RIGHT BREAST LUMPECTOMY WITH RADIOACTIVE SEED LOCALIZATION;  Surgeon: Erroll Luna, MD;  Location: [facility];  Service: General;  Laterality: Right;   BREAST LUMPECTOMY WITH RADIOACTIVE SEED LOCALIZATION Right 09/20/2020   Procedure: RIGHT BREAST LUMPECTOMY WITH RADIOACTIVE SEED LOCALIZATION;  Surgeon: Erroll Luna, MD;  Location: [facility];  Service: General;  Laterality: Right;   KNEE SURGERY  2021   [place]   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   MENISECTOMY Left    [place]   WISDOM TOOTH EXTRACTION      Social History   Socioeconomic History   Marital status: Single    Spouse name:  Not on file   Number of children: 0   Years of education: Not on file   Highest education level: Master's degree (e.g., MA, MS, MEng, MEd, MSW, MBA)  Occupational History   Occupation: NP     Comment: NA  Tobacco Use   Smoking status: Former    Packs/day: 2.00    Years: 29.00    Pack years: 58.00    Types: Cigarettes    Quit date: 10/08/2002    Years since quitting: 18.5   Smokeless tobacco: Never  Vaping Use   Vaping Use: Never used  Substance and Sexual Activity   Alcohol use: No   Drug use: No   Sexual activity: Not on file   Other Topics Concern   Not on file  Social History Narrative   Lives alone   Caffeine- 2 c coffee   Social Determinants of Health   Financial Resource Strain: Not on file  Food Insecurity: Not on file  Transportation Needs: Not on file  Physical Activity: Not on file  Stress: Not on file  Social Connections: Not on file     FAMILY HISTORY:  We obtained a detailed, 4-generation family history.  Significant diagnoses are listed below: Family History  Problem Relation Age of Onset   Uterine cancer Maternal Aunt        dx before 10   Lung cancer Cousin        paternal female cousin; dx after 43     Desiree Snow is unaware of previous family history of genetic testing for hereditary cancer risks. There is no reported Ashkenazi Jewish ancestry. There is no known consanguinity.  GENETIC COUNSELING ASSESSMENT: Desiree Snow is a 70 y.o. female with a family history of cancer which is not highly suggestive of a hereditary cancer syndrome. We, therefore, discussed and recommended the following at today's visit.   DISCUSSION: We discussed that, in general, most cancer is not inherited in families, but instead is sporadic or familial. Sporadic cancers occur by chance and typically happen at older ages (>50 years) as this type of cancer is caused by genetic changes acquired during an individual's lifetime. Some families have more cancers than would be expected by chance; however, the ages or types of cancer are not consistent with a known genetic mutation or known genetic mutations have been ruled out. This type of familial cancer is thought to be due to a combination of multiple genetic, environmental, hormonal, and lifestyle factors. While this combination of factors likely increases the risk of cancer, the exact source of this risk is not currently identifiable or testable.    We discussed that approximately 5-10% of cancer is hereditary, meaning that it is due to a mutation in a single  gene that is passed down from generation to generation in a family. Most hereditary cases of uterine cancer are associated with mutaitons in the Lynch syndrome genes.  We discussed that testing can be beneficial for several reasons, including knowing about other cancer risks, identifying potential screening and risk-reduction options that may be appropriate, and to understand if other family members could be at risk for cancer and allow them to undergo genetic testing.  We discussed with Desiree Snow that the personal and family history does not meet insurance or NCCN criteria for genetic testing and is not highly consistent with a familial hereditary cancer syndrome. We feel she is at low risk to harbor a gene mutation associated with such a condition.Desiree Snow is still interested in moving  forward with genetic testing and feels that she would benefit from having this knowledge. Desiree Snow was informed that because she does not meet insurance or medical criteria, her out of pocket cost will be $250.   We discussed that some people do not want to undergo genetic testing due to fear of genetic discrimination.  A federal law called the Genetic Information Non-Discrimination Act (GINA) of 2008 helps protect individuals against genetic discrimination based on their genetic test results.  It impacts both health insurance and employment.  With health insurance, it protects against increased premiums, being kicked off insurance or being forced to take a test in order to be insured.  For employment it protects against hiring, firing and promoting decisions based on genetic test results.  GINA does not apply to those in the [employer], those who work for companies with less than 15 employees, and new life insurance or long-term disability insurance policies.  Health status due to a cancer diagnosis is not protected under GINA.  PLAN: After considering the risks, benefits, and limitations, Desiree Snow  provided informed consent to pursue genetic testing and the blood sample was sent to [facility] for analysis of the Multi-Cancer +RNA Panel. Results should be available within approximately 3 weeks' time, at which point they will be disclosed by telephone to Desiree Snow, as will any additional recommendations warranted by these results. Desiree Snow will receive a summary of her genetic counseling visit and a copy of her results once available. This information will also be available in Epic.   Based on Desiree Snow's family history of uterine cancer, we recommended her maternal aunt have genetic counseling and testing. Desiree Snow will let us know if we can be of any assistance in coordinating genetic counseling and/or testing for this family member.   Lastly, we encouraged Desiree Snow to remain in contact with cancer genetics annually so that we can continuously update the family history and inform her of any changes in cancer genetics and testing that may be of benefit for this family.   Desiree Snow questions were answered to her satisfaction today. Our contact information was provided should additional questions or concerns arise. Thank you for the referral and allowing [place] to share in the care of your patient.   Darnise Montag M. Joette Catching, [place], [facility] Genetic Counselor [email or web address] (P) [phone]   The patient was seen for a total of 40 minutes in face-to-face genetic counseling.  The patient was seen alone.  Drs. Magrinat, Lindi Adie and/or Burr Medico were available to discuss this case as needed.  _______________________________________________________________________ For Office Staff:  Number of people involved in session: 1 Was an Intern/ student involved with case: no

## 2021-05-13 ENCOUNTER — Encounter: Payer: Self-pay | Admitting: Hematology

## 2021-05-15 ENCOUNTER — Other Ambulatory Visit: Payer: Self-pay | Admitting: Nurse Practitioner

## 2021-05-15 MED ORDER — EXEMESTANE 25 MG PO TABS: 25.0000 mg | ORAL_TABLET | Freq: Every day | ORAL | 1 refills | Status: DC

## 2021-05-15 NOTE — Progress Notes (Signed)
I called Desiree Snow in response to her mychart message. Currently on anastrozole daily. She has baseline low back/sacral pain that is worse. In the past week, she has new hip pain. She denies obvious injury to the area. She moved and lifted a heavy box 1 month ago but did not have this pain then. She held anastrozole since 05/13/21 and pain is already improving. Also taking heavy NSAIDs.   I recommend to d/c anastrozole now and switch to exemestane. We reviewed the rationale and potential side effects. She is concerned about the anti-estrogen affects of aging. She ultimately agrees to try exemestane. I called in to her pharmacy, she will start in a couple weeks.  She will notify us if she is not tolerating exemestane.   Cira Rue, NP

## 2021-05-22 ENCOUNTER — Other Ambulatory Visit: Payer: Self-pay | Admitting: Nurse Practitioner

## 2021-05-26 ENCOUNTER — Telehealth: Payer: Self-pay | Admitting: Genetic Counselor

## 2021-05-26 ENCOUNTER — Encounter: Payer: Self-pay | Admitting: Genetic Counselor

## 2021-05-26 DIAGNOSIS — Z1379 Encounter for other screening for genetic and chromosomal anomalies: Secondary | ICD-10-CM | POA: Insufficient documentation

## 2021-05-26 NOTE — Telephone Encounter (Signed)
Revealed negative genetic testing and VUS in [facility].  Discussed that we do not know why there is cancer in the family. It could be sporadic, due to a change in a gene she did not inherit, due to a different gene that we are not testing, or maybe our current technology may not be able to pick something up.  It will be important for her to keep in contact with genetics to keep up with whether additional testing may be needed.

## 2021-06-05 ENCOUNTER — Encounter: Payer: Self-pay | Admitting: Genetic Counselor

## 2021-06-05 ENCOUNTER — Ambulatory Visit: Payer: Self-pay | Admitting: Genetic Counselor

## 2021-06-05 DIAGNOSIS — Z1379 Encounter for other screening for genetic and chromosomal anomalies: Secondary | ICD-10-CM

## 2021-06-05 DIAGNOSIS — Z8049 Family history of malignant neoplasm of other genital organs: Secondary | ICD-10-CM

## 2021-06-05 NOTE — Progress Notes (Signed)
HPI:  Desiree Snow was previously seen in the [facility] clinic due to a family history of cancer and concerns regarding a hereditary predisposition to cancer. Please refer to our prior cancer genetics clinic note for more information regarding our discussion, assessment and recommendations, at the time. Desiree Snow recent genetic test results were disclosed to her, as were recommendations warranted by these results. These results and recommendations are discussed in more detail below.  CANCER HISTORY:  Oncology History Overview Note  Cancer Staging No matching staging information was found for the patient.      Desiree Snow is a 70 y.o. female with no personal history of cancer.  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  By MRI, she was found to have a mass on her right breast later that year, which also showed right breast ADH after lumpectomy. She is being followed in the high risk clinic by Dr. Burr Medico, with plans to continue annual mammograms and breast MRIs in addition to prophylactic anastrozole for five years.     FAMILY HISTORY:  We obtained a detailed, 4-generation family history.  Significant diagnoses are listed below: Family History  Problem Relation Age of Onset   Uterine cancer Maternal Aunt        dx before 13   Lung cancer Cousin        paternal female cousin; dx after 22      Desiree Snow is unaware of previous family history of genetic testing for hereditary cancer risks. There is no reported Ashkenazi Jewish ancestry. There is no known consanguinity.    GENETIC TEST RESULTS: Genetic testing reported out on May 25, 2021.  The [facility] Multi-Cancer +RNA panel found no pathogenic mutations. The Multi-Cancer + RNA Panel offered by [facility] includes sequencing and/or deletion/duplication analysis of the following 84 genes:  AIP*, ALK, APC*, ATM*, AXIN2*,  BAP1*, BARD1*, BLM*, BMPR1A*, BRCA1*, BRCA2*, BRIP1*, CASR, CDC73*, CDH1*, CDK4, CDKN1B*, CDKN1C*, CDKN2A, CEBPA, CHEK2*, CTNNA1*, DICER1*, DIS3L2*, EGFR, EPCAM, FH*, FLCN*, GATA2*, GPC3, GREM1, HOXB13, HRAS, KIT, MAX*, MEN1*, MET, MITF, MLH1*, MSH2*, MSH3*, MSH6*, MUTYH*, NBN*, NF1*, NF2*, NTHL1*, PALB2*, PDGFRA, PHOX2B, PMS2*, POLD1*, POLE*, POT1*, PRKAR1A*, PTCH1*, PTEN*, RAD50*, RAD51C*, RAD51D*, RB1*, RECQL4, RET, RUNX1*, SDHA*, SDHAF2*, SDHB*, SDHC*, SDHD*, SMAD4*, SMARCA4*, SMARCB1*, SMARCE1*, STK11*, SUFU*, TERC, TERT, TMEM127*, Tp53*, TSC1*, TSC2*, VHL*, WRN*, and WT1.  RNA analysis is performed for * genes.  The test report has been scanned into EPIC and is located under the Molecular Pathology section of the Results Review tab.  A portion of the result report is included below for reference.     We discussed with Desiree Snow that because current genetic testing is not perfect, it is possible there may be a gene mutation in one of these genes that current testing cannot detect, but that chance is small.  We also discussed, that there could be another gene that has not yet been discovered, or that we have not yet tested, that is responsible for the cancer diagnoses in the family. It is also possible there is a hereditary cause for the cancer in the family that Desiree Snow did not inherit and therefore was not identified in her testing.  Therefore, it is important to remain in touch with cancer genetics in the future so that we can continue to offer Desiree Snow the most up to date genetic testing.   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  At this time, it is unknown if this variant is associated with increased cancer risk or if this is a normal finding, but most variants such as this get reclassified to being inconsequential. It should not be used to make medical management decisions. With time, we suspect the  lab will determine the significance of this variant, if any. If we do learn more about it, we will try to contact Desiree Snow to discuss it further. However, it is important to stay in touch with [place] periodically and keep the address and phone number up to date.  ADDITIONAL GENETIC TESTING: We discussed with Desiree Snow that her genetic testing was fairly extensive.  If there are genes identified to increase cancer risk that can be analyzed in the future, we would be happy to discuss and coordinate this testing at that time.    CANCER SCREENING RECOMMENDATIONS: Desiree Snow test result is considered negative (normal).  This means that we have not identified a hereditary cause for her family history of cancer at this time.   While reassuring, this does not definitively rule out a hereditary predisposition to cancer. It is still possible that there could be genetic mutations that are undetectable by current technology. There could be genetic mutations in genes that have not been tested or identified to increase cancer risk.  Therefore, it is recommended she continue to follow the cancer management and screening guidelines provided by her oncology and primary healthcare provider.   An individual's cancer risk and medical management are not determined by genetic test results alone. Overall cancer risk assessment incorporates additional factors, including personal medical history, family history, and any available genetic information that may result in a personalized plan for cancer prevention and surveillance  RECOMMENDATIONS FOR FAMILY MEMBERS:  Individuals in this family might be at some increased risk of developing cancer, over the general population risk, simply due to the family history of cancer.  We recommended women in this family have a yearly mammogram beginning at age 73, or 14 years younger than the earliest onset of cancer, an annual clinical breast exam, and perform monthly breast  self-exams. Women in this family should also have a gynecological exam as recommended by their primary provider. Family members should be referred for colonoscopy starting at age 63, or earlier, as recommended by their providers.   It is also possible there is a hereditary cause for the cancer in Ms. Tallent's family that she did not inherit and therefore was not identified in her.  Based on Ms. Warriner's family history of uterine cancer in her maternal aunt, we recommended first degree relatives of this aunt have genetic counseling and testing. Ms. Dematteo will let us know if we can be of any assistance in coordinating genetic counseling and/or testing for this family member.   FOLLOW-UP: Lastly, we discussed with Ms. Budge that cancer genetics is a rapidly advancing field and it is possible that new genetic tests will be appropriate for her and/or her family members in the future. We encouraged her to remain in contact with cancer genetics on an annual basis so we can update her personal and family histories and let her know of advances in cancer genetics that may benefit this family.   Our contact number was provided. Ms. Alcaide questions were answered to her satisfaction, and she knows she is welcome to call us at anytime with additional questions or concerns.  Climmie Buelow M. Joette Catching, [place], [facility] Genetic Counselor [email or web address] (P) [phone]

## 2021-06-20 ENCOUNTER — Encounter: Payer: Self-pay | Admitting: Hematology

## 2021-07-12 ENCOUNTER — Other Ambulatory Visit: Payer: Self-pay

## 2021-07-12 ENCOUNTER — Other Ambulatory Visit (INDEPENDENT_AMBULATORY_CARE_PROVIDER_SITE_OTHER): Payer: Medicare Other

## 2021-07-12 ENCOUNTER — Ambulatory Visit (INDEPENDENT_AMBULATORY_CARE_PROVIDER_SITE_OTHER): Payer: Medicare Other | Admitting: Endocrinology

## 2021-07-12 VITALS — BP 144/64 | HR 94 | Ht 65.75 in | Wt 207.6 lb

## 2021-07-12 DIAGNOSIS — E042 Nontoxic multinodular goiter: Secondary | ICD-10-CM

## 2021-07-12 LAB — T4, FREE: Free T4: 0.84 ng/dL (ref 0.60–1.60)

## 2021-07-12 LAB — TSH: TSH: 1.5 u[IU]/mL (ref 0.35–5.50)

## 2021-07-12 NOTE — Patient Instructions (Addendum)
Let's recheck the ultrasound.  you will receive a phone call, about a day and time for an appointment.   Thyroid blood tests are requested for you today.  We'll let you know about the results.  Please come back for a follow-up appointment in 1 year.

## 2021-07-12 NOTE — Progress Notes (Signed)
Subjective:    Patient ID: Desiree Snow, female    DOB: [birth year], 70 y.o.   MRN: [id number]  HPI Pt returns for f/u of multinodular goiter: (dx'ed 2018, incidentally on a CT scan of the chest  in 2018; [place]: Nodule #4 in the inferior left thyroid lobe and Nodule #5 in the mid left thyroid lobe both meet criteria for biopsy.  Nodule #3 in the superior left thyroid lobe meets criteria for 1 year follow-up; bx in 2018 (LLP, labeled # 8 on [place]) showed BENIGN FOLLICULAR NODULE ([place] II; f/u US in 2019 shows LMP nodule (labeled #7 is stable in size and continues to meet criteria for bx; 8 is stable and previously underwent biopsy); she is euthyroid off rx).  Pt says she can notice the nodules, but is unaware of any change in size.  pt states she feels well in general.    Past Medical History:  Diagnosis Date   Allergy    Chicken pox    Diverticulosis    Family history of uterine cancer 05/05/2021   GERD (gastroesophageal reflux disease)    Goiter    H/O febrile seizure    as infant, x 1, List of AEDs tried: Dilantin, Mysoline, Tegretol, Gabapentine, Depakote,  Keppra, and Lamictal,   Seizures ([place])    "resolved" last seizure in 2002    Past Surgical History:  Procedure Laterality Date   [place] Right 07/29/2020   COMPLEX SCLEROSING LESION WITH ATYPICAL DUCTAL   BREAST BIOPSY Right 01/01/2020   COMPLEX SCLEROSING LESION    BREAST EXCISIONAL BIOPSY Right 09/20/2020   complex sclerosing   BREAST EXCISIONAL BIOPSY Right 03/01/2020   [place] IDC, CSL also considered   BREAST EXCISIONAL BIOPSY Left    ? Date  ?lipoma   BREAST LUMPECTOMY Left 2009   benign   BREAST LUMPECTOMY WITH RADIOACTIVE SEED LOCALIZATION Right 03/01/2020   Procedure: RIGHT BREAST LUMPECTOMY WITH RADIOACTIVE SEED LOCALIZATION;  Surgeon: Erroll Luna, MD;  Location: [facility];  Service: General;  Laterality: Right;   BREAST LUMPECTOMY WITH RADIOACTIVE SEED  LOCALIZATION Right 09/20/2020   Procedure: RIGHT BREAST LUMPECTOMY WITH RADIOACTIVE SEED LOCALIZATION;  Surgeon: Erroll Luna, MD;  Location: [facility];  Service: General;  Laterality: Right;   KNEE SURGERY  2021   [place]   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   MENISECTOMY Left    [place]   WISDOM TOOTH EXTRACTION      Social History   Socioeconomic History   Marital status: Single    Spouse name: Not on file   Number of children: 0   Years of education: Not on file   Highest education level: Master's degree (e.g., MA, MS, MEng, MEd, MSW, MBA)  Occupational History   Occupation: NP     Comment: NA  Tobacco Use   Smoking status: Former    Packs/day: 2.00    Years: 29.00    Pack years: 58.00    Types: Cigarettes    Quit date: 10/08/2002    Years since quitting: 18.7   Smokeless tobacco: Never  Vaping Use   Vaping Use: Never used  Substance and Sexual Activity   Alcohol use: No   Drug use: No   Sexual activity: Not on file  Other Topics Concern   Not on file  Social History Narrative   Lives alone   Caffeine- 2  c coffee   Social Determinants of Radio broadcast assistant Strain: Not on file  Food Insecurity: Not on file  Transportation Needs: Not on file  Physical Activity: Not on file  Stress: Not on file  Social Connections: Not on file  Intimate Partner Violence: Not on file    Current Outpatient Medications on File Prior to Visit  Medication Sig Dispense Refill   Calcium Carbonate-Vit D-Min (CALCIUM 1200 PO) Take 1 tablet by mouth daily.     Cholecalciferol (VITAMIN D3) 1000 units CAPS Take 1 capsule by mouth daily.     CINNAMON PO Take 100 tablets by mouth daily.      clorazepate (TRANXENE) 3.75 MG tablet Take 1 tablet daily as needed by mouth.     exemestane (AROMASIN) 25 MG tablet Take 1 tablet (25 mg total) by mouth daily after breakfast. 30 tablet 1    famotidine (PEPCID) 20 MG tablet Take 20 mg by mouth daily.      Flaxseed, Linseed, (FLAX SEED OIL PO) Take by mouth.     lamoTRIgine (LAMICTAL) 100 MG tablet Take 1 tablet (100 mg total) by mouth 2 (two) times daily. 180 tablet 4   Magnesium 250 MG TABS Take 1 tablet by mouth daily.     Multiple Vitamin (MULTIVITAMIN) tablet Take 1 tablet by mouth daily.     TURMERIC PO Take 1 tablet by mouth daily.     UNABLE TO FIND Med Name: Collagen 2 otc once daily     UNABLE TO FIND Med Name: CLA (over the counter) daily     Zinc 50 MG CAPS Take 1 capsule by mouth daily.     fluticasone (FLONASE) 50 MCG/ACT nasal spray Place 1 spray into both nostrils 2 (two) times daily as needed for allergies or rhinitis. 16 g 5   ibuprofen (ADVIL) 800 MG tablet Take 1 tablet (800 mg total) by mouth every 8 (eight) hours as needed. 30 tablet 0   No current facility-administered medications on file prior to visit.    Allergies  Allergen Reactions   Cetirizine Other (See Comments)    Cognitive issues and "lowers seizure threshold."   Metronidazole Other (See Comments)    Strange feeling and inability to function   Quinolones    Scopolamine Hives     on her fingers no itchng   Penicillins Rash    Family History  Problem Relation Age of Onset   Pneumonia Mother    Dementia Father    Uterine cancer Maternal Aunt        dx before 52   Hypertension Maternal Grandmother    Stroke Maternal Grandmother    Dementia Paternal Grandfather    Lung cancer Cousin        paternal female cousin; dx after 82   Goiter Neg Hx    Thyroid cancer Neg Hx    Thyroid nodules Neg Hx    Thyroid disease Neg Hx     BP (!) 144/64 (BP Location: Right Arm, Patient Position: Sitting, Cuff Size: Large)   Pulse 94   Ht 5' 5.75" (1.67 m)   Wt 207 lb 9.6 oz (94.2 kg)   SpO2 97%   BMI 33.76 kg/m    Review of Systems     Objective:   Physical Exam VITAL SIGNS:  See vs page.   GENERAL: no distress.   NECK: left and right  thyroid nodules are again palpable.   Lab Results  Component Value Date   TSH 1.50  07/12/2021      Assessment & Plan:  MNG, due for recheck.  Euthyroid. Check [place].

## 2021-07-18 ENCOUNTER — Ambulatory Visit (HOSPITAL_BASED_OUTPATIENT_CLINIC_OR_DEPARTMENT_OTHER)
Admission: RE | Admit: 2021-07-18 | Discharge: 2021-07-18 | Disposition: A | Discharge status: Home / Self Care | Payer: Medicare Other | Source: Ambulatory Visit | Attending: Endocrinology | Admitting: Endocrinology

## 2021-07-18 ENCOUNTER — Other Ambulatory Visit: Payer: Self-pay

## 2021-07-18 DIAGNOSIS — E042 Nontoxic multinodular goiter: Secondary | ICD-10-CM | POA: Insufficient documentation

## 2021-08-01 NOTE — H&P (Signed)
Patient's anticipated LOS is less than 2 midnights, meeting these requirements: - Younger than 63 - Lives within 1 hour of care - Has a competent adult at home to recover with post-op recover - NO history of  - Chronic pain requiring opiods  - Diabetes  - Coronary Artery Disease  - Heart failure  - Heart attack  - Stroke  - DVT/VTE  - Cardiac arrhythmia  - Respiratory Failure/COPD  - Renal failure  - Anemia  - Advanced Liver disease     Desiree Snow is an 70 y.o. female.    Chief Complaint: left knee pain  HPI: Pt is a 70 y.o. female complaining of left knee pain for multiple years. Pain had continually increased since the beginning. X-rays in the clinic show end-stage arthritic changes of the left knee. Pt has tried various conservative treatments which have failed to alleviate their symptoms, including injections and therapy. Various options are discussed with the patient. Risks, benefits and expectations were discussed with the patient. Patient understand the risks, benefits and expectations and wishes to proceed with surgery.   PCP:  Haywood Pao, MD  D/C Plans: Home  PMH: Past Medical History:  Diagnosis Date   Allergy    Chicken pox    Diverticulosis    Family history of uterine cancer 05/05/2021   GERD (gastroesophageal reflux disease)    Goiter    H/O febrile seizure    as infant, x 1, List of AEDs tried: Dilantin, Mysoline, Tegretol, Gabapentine, Depakote,  Keppra, and Lamictal,   Seizures ([place])    "resolved" last seizure in 2002    [place]: Past Surgical History:  Procedure Laterality Date   [place] Right 07/29/2020   COMPLEX SCLEROSING LESION WITH ATYPICAL DUCTAL   BREAST BIOPSY Right 01/01/2020   COMPLEX SCLEROSING LESION    BREAST EXCISIONAL BIOPSY Right 09/20/2020   complex sclerosing   BREAST EXCISIONAL BIOPSY Right 03/01/2020   [facility] IDC, CSL also considered   BREAST EXCISIONAL BIOPSY Left    ? Date  ?lipoma    BREAST LUMPECTOMY Left 2009   benign   BREAST LUMPECTOMY WITH RADIOACTIVE SEED LOCALIZATION Right 03/01/2020   Procedure: RIGHT BREAST LUMPECTOMY WITH RADIOACTIVE SEED LOCALIZATION;  Surgeon: Erroll Luna, MD;  Location: [facility];  Service: General;  Laterality: Right;   BREAST LUMPECTOMY WITH RADIOACTIVE SEED LOCALIZATION Right 09/20/2020   Procedure: RIGHT BREAST LUMPECTOMY WITH RADIOACTIVE SEED LOCALIZATION;  Surgeon: Erroll Luna, MD;  Location: [facility];  Service: General;  Laterality: Right;   KNEE SURGERY  2021   [place]   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   MENISECTOMY Left    [place]   WISDOM TOOTH EXTRACTION      Social History:  reports that she quit smoking about 18 years ago. Her smoking use included cigarettes. She has a 58.00 pack-year smoking history. She has never used smokeless tobacco. She reports that she does not drink alcohol and does not use drugs.  Allergies:  Allergies  Allergen Reactions   Cetirizine Other (See Comments)    Cognitive issues and "lowers seizure threshold."   Metronidazole Other (See Comments)    Strange feeling and inability to function   Quinolones    Scopolamine Hives     on her fingers no itchng   Penicillins Rash    Medications: No current facility-administered medications for this encounter.  Current Outpatient Medications  Medication Sig Dispense Refill   Calcium Carbonate-Vit D-Min (CALCIUM 1200 PO) Take 1 tablet by mouth daily.     Cholecalciferol (VITAMIN D3) 1000 units CAPS Take 1 capsule by mouth daily.     CINNAMON PO Take 100 tablets by mouth daily.      clorazepate (TRANXENE) 3.75 MG tablet Take 1 tablet daily as needed by mouth.     exemestane (AROMASIN) 25 MG tablet Take 1 tablet (25 mg total) by mouth daily after breakfast. 30 tablet 1   famotidine (PEPCID) 20 MG tablet Take 20 mg by mouth  daily.      Flaxseed, Linseed, (FLAX SEED OIL PO) Take by mouth.     fluticasone (FLONASE) 50 MCG/ACT nasal spray Place 1 spray into both nostrils 2 (two) times daily as needed for allergies or rhinitis. 16 g 5   ibuprofen (ADVIL) 800 MG tablet Take 1 tablet (800 mg total) by mouth every 8 (eight) hours as needed. 30 tablet 0   lamoTRIgine (LAMICTAL) 100 MG tablet Take 1 tablet (100 mg total) by mouth 2 (two) times daily. 180 tablet 4   Magnesium 250 MG TABS Take 1 tablet by mouth daily.     Multiple Vitamin (MULTIVITAMIN) tablet Take 1 tablet by mouth daily.     TURMERIC PO Take 1 tablet by mouth daily.     UNABLE TO FIND Med Name: Collagen 2 otc once daily     UNABLE TO FIND Med Name: CLA (over the counter) daily     Zinc 50 MG CAPS Take 1 capsule by mouth daily.      No results found for this or any previous visit (from the past 48 hour(s)). No results found.  ROS: Pain with rom of the left lower extremity  Physical Exam: Alert and oriented 70 y.o. female in no acute distress Cranial nerves 2-12 intact Cervical spine: full rom with no tenderness, nv intact distally Chest: active breath sounds bilaterally, no wheeze rhonchi or rales Heart: regular rate and rhythm, no murmur Abd: non tender non distended with active bowel sounds Hip is stable with rom  Left knee pain and crepitus with rom Antalgic gait No rashes or edema   Assessment/Plan Assessment: left knee end stage osteoarthritis  Plan:  Patient will undergo a left total knee by Dr. Veverly Fells at [facility] Risks benefits and expectations were discussed with the patient. Patient understand risks, benefits and expectations and wishes to proceed. Preoperative templating of the joint replacement has been completed, documented, and submitted to the Operating Room personnel in order to optimize intra-operative equipment management.   Merla Riches PA-C, MPAS [facility] is now [employer] [address] [place], [place], [place] [address] Phone: [phone] [email or web address]  [employer]

## 2021-08-17 NOTE — Patient Instructions (Signed)
DUE TO COVID-19 ONLY ONE VISITOR IS ALLOWED TO COME WITH YOU AND STAY IN THE WAITING ROOM ONLY DURING PRE OP AND PROCEDURE.   **NO VISITORS ARE ALLOWED IN THE SHORT STAY AREA OR RECOVERY ROOM!!**  IF YOU WILL BE ADMITTED INTO THE HOSPITAL YOU ARE ALLOWED ONLY TWO SUPPORT PEOPLE DURING VISITATION HOURS ONLY (7 AM -8PM)   The support person(s) must pass our screening, gel in and out, and wear a mask at all times, including in the patient's room. Patients must also wear a mask when staff or their support person are in the room. Visitors GUEST BADGE MUST BE WORN VISIBLY  One adult visitor may remain with you overnight and MUST be in the room by 8 P.M.  No visitors under the age of 53. Any visitor under the age of 28 must be accompanied by an adult.    COVID SWAB TESTING MUST BE COMPLETED ON:  08/23/21 **MUST PRESENT COMPLETED FORM AT TESTING SITE**    [place] (backside of the building) You are not required to quarantine, however you are required to wear a well-fitted mask when you are out and around people not in your household.  Hand Hygiene often Do NOT share personal items Notify your provider if you are in close contact with someone who has COVID or you develop fever 100.4 or greater, new onset of sneezing, cough, sore throat, shortness of breath or body aches.  [place], [address], must go inside of the hospital, NOT A DRIVE THRU!  (Must self quarantine after testing. Follow instructions on handout.)       Your procedure is scheduled on: 08/25/21   Report to [facility] Main Entrance    Report to short stay at : 5:15 AM   Call this number if you have problems the morning of surgery [phone]   Do not eat food :After Midnight.   May have liquids until: 4:30 AM    day of surgery  CLEAR LIQUID DIET  Foods Allowed                                                                      Foods Excluded  Water, Black Coffee and tea, regular and decaf                             liquids that you cannot  Plain Jell-O in any flavor  (No red)                                           see through such as: Fruit ices (not with fruit pulp)                                     milk, soups, orange juice              Iced Popsicles (No red)  All solid food                                   Apple juices Sports drinks like Gatorade (No red) Lightly seasoned clear broth or consume(fat free) Sugar  Sample Menu Breakfast                                Lunch                                     Supper Cranberry juice                    Beef broth                            Chicken broth Jell-O                                     Grape juice                           Apple juice Coffee or tea                        Jell-O                                      Popsicle                                                Coffee or tea                        Coffee or tea    Complete one Ensure drink the morning of surgery at : 4:30 AM      the day of surgery   The day of surgery:  Drink ONE (1) Pre-Surgery Clear Ensure or G2 by am the morning of surgery. Drink in one sitting. Do not sip.  This drink was given to you during your hospital  pre-op appointment visit. Nothing else to drink after completing the  Pre-Surgery Clear Ensure or G2.          If you have questions, please contact your surgeon's office.     Oral Hygiene is also important to reduce your risk of infection.                                    Remember - BRUSH YOUR TEETH THE MORNING OF SURGERY WITH YOUR REGULAR TOOTHPASTE   Do NOT smoke after Midnight   Take these medicines the morning of surgery with A SIP OF WATER: lamotrigine,famotidine.Clorazepate as needed.Flonase as usual.  DO NOT TAKE ANY ORAL DIABETIC MEDICATIONS DAY OF YOUR SURGERY  You may not have  any metal on your body including hair pins, jewelry, and body piercing             Do not wear make-up, lotions, powders, perfumes/cologne, or deodorant  Do not wear nail polish including gel and S&S, artificial/acrylic nails, or any other type of covering on natural nails including finger and toenails. If you have artificial nails, gel coating, etc. that needs to be removed by a nail salon please have this removed prior to surgery or surgery may need to be canceled/ delayed if the surgeon/ anesthesia feels like they are unable to be safely monitored.   Do not shave  48 hours prior to surgery.    Do not bring valuables to the hospital. [place].   Contacts, dentures or bridgework may not be worn into surgery.   Bring small overnight bag day of surgery.    Patients discharged on the day of surgery will not be allowed to drive home.   Special Instructions: Bring a copy of your healthcare power of attorney and living will documents         the day of surgery if you haven't scanned them before.              Please read over the following fact sheets you were given: IF YOU HAVE QUESTIONS ABOUT YOUR PRE-OP INSTRUCTIONS PLEASE CALL [phone]     [facility] - Preparing for Surgery Before surgery, you can play an important role.  Because skin is not sterile, your skin needs to be as free of germs as possible.  You can reduce the number of germs on your skin by washing with CHG (chlorahexidine gluconate) soap before surgery.  CHG is an antiseptic cleaner which kills germs and bonds with the skin to continue killing germs even after washing. Please DO NOT use if you have an allergy to CHG or antibacterial soaps.  If your skin becomes reddened/irritated stop using the CHG and inform your nurse when you arrive at Short Stay. Do not shave (including legs and underarms) for at least 48 hours prior to the first CHG shower.  You may shave your  face/neck. Please follow these instructions carefully:  1.  Shower with CHG Soap the night before surgery and the  morning of Surgery.  2.  If you choose to wash your hair, wash your hair first as usual with your  normal  shampoo.  3.  After you shampoo, rinse your hair and body thoroughly to remove the  shampoo.                           4.  Use CHG as you would any other liquid soap.  You can apply chg directly  to the skin and wash                       Gently with a scrungie or clean washcloth.  5.  Apply the CHG Soap to your body ONLY FROM THE NECK DOWN.   Do not use on face/ open                           Wound or open sores. Avoid contact with eyes, ears mouth and genitals (private parts).  Wash face,  Genitals (private parts) with your normal soap.             6.  Wash thoroughly, paying special attention to the area where your surgery  will be performed.  7.  Thoroughly rinse your body with warm water from the neck down.  8.  DO NOT shower/wash with your normal soap after using and rinsing off  the CHG Soap.                9.  Pat yourself dry with a clean towel.            10.  Wear clean pajamas.            11.  Place clean sheets on your bed the night of your first shower and do not  sleep with pets. Day of Surgery : Do not apply any lotions/deodorants the morning of surgery.  Please wear clean clothes to the hospital/surgery center.  FAILURE TO FOLLOW THESE INSTRUCTIONS MAY RESULT IN THE CANCELLATION OF YOUR SURGERY PATIENT SIGNATURE_________________________________  NURSE SIGNATURE__________________________________  ________________________________________________________________________   Desiree Snow  An incentive spirometer is a tool that can help keep your lungs clear and active. This tool measures how well you are filling your lungs with each breath. Taking long deep breaths may help reverse or decrease the chance of developing breathing  (pulmonary) problems (especially infection) following: A long period of time when you are unable to move or be active. BEFORE THE PROCEDURE  If the spirometer includes an indicator to show your best effort, your nurse or respiratory therapist will set it to a desired goal. If possible, sit up straight or lean slightly forward. Try not to slouch. Hold the incentive spirometer in an upright position. INSTRUCTIONS FOR USE  Sit on the edge of your bed if possible, or sit up as far as you can in bed or on a chair. Hold the incentive spirometer in an upright position. Breathe out normally. Place the mouthpiece in your mouth and seal your lips tightly around it. Breathe in slowly and as deeply as possible, raising the piston or the ball toward the top of the column. Hold your breath for 3-5 seconds or for as long as possible. Allow the piston or ball to fall to the bottom of the column. Remove the mouthpiece from your mouth and breathe out normally. Rest for a few seconds and repeat Steps 1 through 7 at least 10 times every 1-2 hours when you are awake. Take your time and take a few normal breaths between deep breaths. The spirometer may include an indicator to show your best effort. Use the indicator as a goal to work toward during each repetition. After each set of 10 deep breaths, practice coughing to be sure your lungs are clear. If you have an incision (the cut made at the time of surgery), support your incision when coughing by placing a pillow or rolled up towels firmly against it. Once you are able to get out of bed, walk around indoors and cough well. You may stop using the incentive spirometer when instructed by your caregiver.  RISKS AND COMPLICATIONS Take your time so you do not get dizzy or light-headed. If you are in pain, you may need to take or ask for pain medication before doing incentive spirometry. It is harder to take a deep breath if you are having pain. AFTER USE Rest and  breathe slowly and easily. It can be helpful to keep track  of a log of your progress. Your caregiver can provide you with a simple table to help with this. If you are using the spirometer at home, follow these instructions: [place] IF:  You are having difficultly using the spirometer. You have trouble using the spirometer as often as instructed. Your pain medication is not giving enough relief while using the spirometer. You develop fever of 100.5 F (38.1 C) or higher. SEEK IMMEDIATE MEDICAL CARE IF:  You cough up bloody sputum that had not been present before. You develop fever of 102 F (38.9 C) or greater. You develop worsening pain at or near the incision site. MAKE SURE YOU:  Understand these instructions. Will watch your condition. Will get help right away if you are not doing well or get worse. Document Released: 02/04/2007 Document Revised: 12/17/2011 Document Reviewed: 04/07/2007 [facility] Patient Information 2014 [place], [place].   ________________________________________________________________________

## 2021-08-18 ENCOUNTER — Encounter (HOSPITAL_COMMUNITY)
Admission: RE | Admit: 2021-08-18 | Discharge: 2021-08-18 | Disposition: A | Discharge status: Home / Self Care | Payer: Medicare Other | Source: Ambulatory Visit | Attending: Orthopedic Surgery | Admitting: Orthopedic Surgery

## 2021-08-18 ENCOUNTER — Encounter (HOSPITAL_COMMUNITY): Payer: Self-pay

## 2021-08-18 ENCOUNTER — Other Ambulatory Visit: Payer: Self-pay

## 2021-08-18 VITALS — BP 140/75 | HR 91 | Temp 98.0°F | Ht 66.0 in | Wt 198.0 lb

## 2021-08-18 DIAGNOSIS — Z01818 Encounter for other preprocedural examination: Secondary | ICD-10-CM

## 2021-08-18 DIAGNOSIS — Z01812 Encounter for preprocedural laboratory examination: Secondary | ICD-10-CM | POA: Insufficient documentation

## 2021-08-18 HISTORY — DX: Unspecified osteoarthritis, unspecified site: M19.90

## 2021-08-18 LAB — CBC
HCT: 41.3 % (ref 36.0–46.0)
Hemoglobin: 13.4 g/dL (ref 12.0–15.0)
MCH: 30.8 pg (ref 26.0–34.0)
MCHC: 32.4 g/dL (ref 30.0–36.0)
MCV: 94.9 fL (ref 80.0–100.0)
Platelets: 398 10*3/uL (ref 150–400)
RBC: 4.35 MIL/uL (ref 3.87–5.11)
RDW: 12.8 % (ref 11.5–15.5)
WBC: 11.6 10*3/uL — ABNORMAL HIGH (ref 4.0–10.5)
nRBC: 0 % (ref 0.0–0.2)

## 2021-08-18 LAB — SURGICAL PCR SCREEN
MRSA, PCR: NEGATIVE
Staphylococcus aureus: NEGATIVE

## 2021-08-18 NOTE — Progress Notes (Signed)
COVID Vaccine Completed: Yes Date COVID Vaccine completed: 2022 x 5 COVID vaccine manufacturer: [place] Test: 08/23/21  PCP - Dr. Domenick Gong Cardiologist -   Chest x-ray -  EKG -  Stress Test -  ECHO -  Cardiac Cath -  Pacemaker/ICD device last checked:  Sleep Study -  CPAP -   Fasting Blood Sugar -  Checks Blood Sugar _____ times a day  Blood Thinner Instructions: Aspirin Instructions: Last Dose:  Anesthesia review: Hx: Seizures(last and only one on: 1998)  Patient denies shortness of breath, fever, cough and chest pain at PAT appointment   Patient verbalized understanding of instructions that were given to them at the PAT appointment. Patient was also instructed that they will need to review over the PAT instructions again at home before surgery.

## 2021-08-24 ENCOUNTER — Other Ambulatory Visit: Payer: Self-pay | Admitting: Orthopedic Surgery

## 2021-08-24 NOTE — Anesthesia Preprocedure Evaluation (Addendum)
Anesthesia Evaluation  Patient identified by MRN, date of birth, ID band Patient awake    Reviewed: Allergy & Precautions, NPO status , Patient's Chart, lab work & pertinent test results  History of Anesthesia Complications Negative for: history of anesthetic complications  Airway Mallampati: II  TM Distance: >3 FB Neck ROM: Full    Dental  (+) Dental Advisory Given   Pulmonary former smoker,    breath sounds clear to auscultation       Cardiovascular + DOE   Rhythm:Regular Rate:Normal     Neuro/Psych negative neurological ROS     GI/Hepatic Neg liver ROS, GERD  Medicated and Controlled,  Endo/Other  obese  Renal/GU negative Renal ROS     Musculoskeletal  (+) Arthritis ,   Abdominal (+) + obese,   Peds  Hematology negative hematology ROS (+)   Anesthesia Other Findings   Reproductive/Obstetrics                            Anesthesia Physical Anesthesia Plan  ASA: 3  Anesthesia Plan: Spinal   Post-op Pain Management:    Induction:   PONV Risk Score and Plan: 2 and Ondansetron and Treatment may vary due to age or medical condition  Airway Management Planned: Natural Airway and Simple Face Mask  Additional Equipment: None  Intra-op Plan:   Post-operative Plan:   Informed Consent: I have reviewed the patients History and Physical, chart, labs and discussed the procedure including the risks, benefits and alternatives for the proposed anesthesia with the patient or authorized representative who has indicated his/her understanding and acceptance.     Dental advisory given  Plan Discussed with: CRNA and Surgeon  Anesthesia Plan Comments: (Plan routine monitors, SAB with adductor canal block for post op analgesia)      Anesthesia Quick Evaluation

## 2021-08-25 ENCOUNTER — Ambulatory Visit (HOSPITAL_COMMUNITY): Payer: Medicare Other | Admitting: Anesthesiology

## 2021-08-25 ENCOUNTER — Encounter (HOSPITAL_COMMUNITY): Payer: Self-pay | Admitting: Orthopedic Surgery

## 2021-08-25 ENCOUNTER — Observation Stay (HOSPITAL_COMMUNITY)
Admission: RE | Admit: 2021-08-25 | Discharge: 2021-08-26 | Disposition: A | Discharge status: Home / Self Care | Payer: Medicare Other | Source: Ambulatory Visit | Attending: Orthopedic Surgery | Admitting: Orthopedic Surgery

## 2021-08-25 ENCOUNTER — Other Ambulatory Visit: Payer: Self-pay

## 2021-08-25 ENCOUNTER — Encounter (HOSPITAL_COMMUNITY)
Admission: RE | Disposition: A | Discharge status: Home / Self Care | Payer: Self-pay | Source: Ambulatory Visit | Attending: Orthopedic Surgery

## 2021-08-25 DIAGNOSIS — Z20822 Contact with and (suspected) exposure to covid-19: Secondary | ICD-10-CM | POA: Insufficient documentation

## 2021-08-25 DIAGNOSIS — M1712 Unilateral primary osteoarthritis, left knee: Principal | ICD-10-CM | POA: Insufficient documentation

## 2021-08-25 DIAGNOSIS — Z01818 Encounter for other preprocedural examination: Secondary | ICD-10-CM

## 2021-08-25 DIAGNOSIS — Z96652 Presence of left artificial knee joint: Secondary | ICD-10-CM

## 2021-08-25 HISTORY — PX: TOTAL KNEE ARTHROPLASTY: SHX125

## 2021-08-25 LAB — SARS CORONAVIRUS 2 BY RT PCR (HOSPITAL ORDER, PERFORMED IN ~~LOC~~ HOSPITAL LAB): SARS Coronavirus 2: NEGATIVE

## 2021-08-25 LAB — SARS CORONAVIRUS 2 (TAT 6-24 HRS): SARS Coronavirus 2: NEGATIVE

## 2021-08-25 SURGERY — ARTHROPLASTY, KNEE, TOTAL
Anesthesia: Spinal | Site: Knee | Laterality: Left

## 2021-08-25 MED ORDER — LACTATED RINGERS IV SOLN: INTRAVENOUS | Status: DC

## 2021-08-25 MED ORDER — PHENYLEPHRINE HCL (PRESSORS) 10 MG/ML IV SOLN
INTRAVENOUS | Status: DC | PRN
  Administered 2021-08-25: 40 ug via INTRAVENOUS
  Administered 2021-08-25: 80 ug via INTRAVENOUS
  Administered 2021-08-25: 40 ug via INTRAVENOUS

## 2021-08-25 MED ORDER — HYDROMORPHONE HCL 1 MG/ML IJ SOLN
INTRAMUSCULAR | Status: AC
  Administered 2021-08-25: 0.5 mg via INTRAVENOUS
  Filled 2021-08-25: qty 1

## 2021-08-25 MED ORDER — ONDANSETRON HCL 4 MG PO TABS: 4.0000 mg | ORAL_TABLET | Freq: Three times a day (TID) | ORAL | 1 refills | Status: DC | PRN

## 2021-08-25 MED ORDER — PROPOFOL 500 MG/50ML IV EMUL
INTRAVENOUS | Status: DC | PRN
  Administered 2021-08-25: 60 ug/kg/min via INTRAVENOUS

## 2021-08-25 MED ORDER — SODIUM CHLORIDE 0.9 % IR SOLN
Status: DC | PRN
  Administered 2021-08-25: 3000 mL

## 2021-08-25 MED ORDER — ONDANSETRON HCL 4 MG PO TABS: 4.0000 mg | ORAL_TABLET | Freq: Four times a day (QID) | ORAL | Status: DC | PRN

## 2021-08-25 MED ORDER — OXYCODONE-ACETAMINOPHEN 5-325 MG PO TABS: 1.0000 | ORAL_TABLET | ORAL | 0 refills | Status: DC | PRN

## 2021-08-25 MED ORDER — METHOCARBAMOL 500 MG PO TABS: 500.0000 mg | ORAL_TABLET | Freq: Three times a day (TID) | ORAL | 1 refills | Status: DC | PRN

## 2021-08-25 MED ORDER — BUPIVACAINE-EPINEPHRINE (PF) 0.25% -1:200000 IJ SOLN
INTRAMUSCULAR | Status: AC
  Filled 2021-08-25: qty 30

## 2021-08-25 MED ORDER — MORPHINE SULFATE (PF) 2 MG/ML IV SOLN
1.0000 mg | INTRAVENOUS | Status: DC | PRN
Start: 2021-08-25 — End: 2021-08-25

## 2021-08-25 MED ORDER — TRANEXAMIC ACID-NACL 1000-0.7 MG/100ML-% IV SOLN
1000.0000 mg | Freq: Once | INTRAVENOUS | Status: AC
  Administered 2021-08-25: 1000 mg via INTRAVENOUS
  Filled 2021-08-25: qty 100

## 2021-08-25 MED ORDER — OXYCODONE HCL 5 MG/5ML PO SOLN: 5.0000 mg | Freq: Once | ORAL | Status: AC | PRN

## 2021-08-25 MED ORDER — HYDROMORPHONE HCL 1 MG/ML IJ SOLN
0.5000 mg | INTRAMUSCULAR | Status: DC | PRN
  Administered 2021-08-25: 1 mg via INTRAVENOUS
  Filled 2021-08-25: qty 1

## 2021-08-25 MED ORDER — FERROUS SULFATE 325 (65 FE) MG PO TABS
325.0000 mg | ORAL_TABLET | Freq: Three times a day (TID) | ORAL | Status: DC
  Administered 2021-08-26 (×2): 325 mg via ORAL
  Filled 2021-08-25 (×4): qty 1

## 2021-08-25 MED ORDER — MORPHINE SULFATE (PF) 4 MG/ML IV SOLN
4.0000 mg | INTRAVENOUS | Status: DC | PRN
  Administered 2021-08-25: 6 mg via INTRAVENOUS
  Administered 2021-08-25: 4 mg via INTRAVENOUS
  Administered 2021-08-26 (×2): 6 mg via INTRAVENOUS
  Filled 2021-08-25: qty 2
  Filled 2021-08-25: qty 1
  Filled 2021-08-25 (×3): qty 2

## 2021-08-25 MED ORDER — MAGNESIUM OXIDE -MG SUPPLEMENT 400 (240 MG) MG PO TABS
400.0000 mg | ORAL_TABLET | Freq: Every day | ORAL | Status: DC
  Administered 2021-08-25 – 2021-08-26 (×2): 400 mg via ORAL
  Filled 2021-08-25 (×2): qty 1

## 2021-08-25 MED ORDER — EXEMESTANE 25 MG PO TABS: 25.0000 mg | ORAL_TABLET | Freq: Every day | ORAL | Status: DC

## 2021-08-25 MED ORDER — POLYETHYLENE GLYCOL 3350 17 G PO PACK: 17.0000 g | PACK | Freq: Every day | ORAL | Status: DC | PRN

## 2021-08-25 MED ORDER — FENTANYL CITRATE (PF) 250 MCG/5ML IJ SOLN
INTRAMUSCULAR | Status: DC | PRN
  Administered 2021-08-25: 25 ug via INTRAVENOUS
  Administered 2021-08-25: 50 ug via INTRAVENOUS
  Administered 2021-08-25: 25 ug via INTRAVENOUS

## 2021-08-25 MED ORDER — FLUTICASONE PROPIONATE 50 MCG/ACT NA SUSP
1.0000 | Freq: Two times a day (BID) | NASAL | Status: DC | PRN
  Filled 2021-08-25: qty 16

## 2021-08-25 MED ORDER — OXYCODONE HCL 5 MG PO TABS
ORAL_TABLET | ORAL | Status: AC
  Filled 2021-08-25: qty 1

## 2021-08-25 MED ORDER — METHOCARBAMOL 500 MG IVPB - SIMPLE MED
INTRAVENOUS | Status: AC
  Filled 2021-08-25: qty 50

## 2021-08-25 MED ORDER — ASPIRIN 81 MG PO CHEW
81.0000 mg | CHEWABLE_TABLET | Freq: Two times a day (BID) | ORAL | Status: DC
  Administered 2021-08-25 – 2021-08-26 (×2): 81 mg via ORAL
  Filled 2021-08-25 (×3): qty 1

## 2021-08-25 MED ORDER — PHENYLEPHRINE 40 MCG/ML (10ML) SYRINGE FOR IV PUSH (FOR BLOOD PRESSURE SUPPORT)
PREFILLED_SYRINGE | INTRAVENOUS | Status: AC
  Filled 2021-08-25: qty 10

## 2021-08-25 MED ORDER — MEPERIDINE HCL 50 MG/ML IJ SOLN: 6.2500 mg | INTRAMUSCULAR | Status: DC | PRN

## 2021-08-25 MED ORDER — PHENOL 1.4 % MT LIQD: 1.0000 | OROMUCOSAL | Status: DC | PRN

## 2021-08-25 MED ORDER — BUPIVACAINE LIPOSOME 1.3 % IJ SUSP: 20.0000 mL | Freq: Once | INTRAMUSCULAR | Status: DC

## 2021-08-25 MED ORDER — ROPIVACAINE HCL 5 MG/ML IJ SOLN
INTRAMUSCULAR | Status: DC | PRN
  Administered 2021-08-25: 20 mL via PERINEURAL

## 2021-08-25 MED ORDER — PHENYLEPHRINE HCL-NACL 20-0.9 MG/250ML-% IV SOLN
INTRAVENOUS | Status: DC | PRN
  Administered 2021-08-25: 20 ug/min via INTRAVENOUS
  Administered 2021-08-25: 30 ug/min via INTRAVENOUS

## 2021-08-25 MED ORDER — CEFAZOLIN SODIUM-DEXTROSE 2-4 GM/100ML-% IV SOLN
2.0000 g | Freq: Four times a day (QID) | INTRAVENOUS | Status: AC
  Administered 2021-08-25 (×2): 2 g via INTRAVENOUS
  Filled 2021-08-25 (×3): qty 100

## 2021-08-25 MED ORDER — TRANEXAMIC ACID-NACL 1000-0.7 MG/100ML-% IV SOLN
1000.0000 mg | INTRAVENOUS | Status: AC
  Administered 2021-08-25: 1000 mg via INTRAVENOUS
  Filled 2021-08-25: qty 100

## 2021-08-25 MED ORDER — BISACODYL 10 MG RE SUPP: 10.0000 mg | Freq: Every day | RECTAL | Status: DC | PRN

## 2021-08-25 MED ORDER — ROPIVACAINE HCL 5 MG/ML IJ SOLN: INTRAMUSCULAR | Status: DC | PRN

## 2021-08-25 MED ORDER — ASPIRIN 81 MG PO CHEW: 81.0000 mg | CHEWABLE_TABLET | Freq: Two times a day (BID) | ORAL | 0 refills | Status: AC

## 2021-08-25 MED ORDER — PROPOFOL 1000 MG/100ML IV EMUL
INTRAVENOUS | Status: AC
  Filled 2021-08-25: qty 100

## 2021-08-25 MED ORDER — ONDANSETRON HCL 4 MG/2ML IJ SOLN
INTRAMUSCULAR | Status: DC | PRN
  Administered 2021-08-25: 4 mg via INTRAVENOUS

## 2021-08-25 MED ORDER — 0.9 % SODIUM CHLORIDE (POUR BTL) OPTIME
TOPICAL | Status: DC | PRN
  Administered 2021-08-25: 1000 mL

## 2021-08-25 MED ORDER — BUPIVACAINE LIPOSOME 1.3 % IJ SUSP
INTRAMUSCULAR | Status: DC | PRN
  Administered 2021-08-25: 20 mL

## 2021-08-25 MED ORDER — ACETAMINOPHEN 500 MG PO TABS
1000.0000 mg | ORAL_TABLET | Freq: Once | ORAL | Status: AC
  Administered 2021-08-25: 1000 mg via ORAL
  Filled 2021-08-25: qty 2

## 2021-08-25 MED ORDER — DOCUSATE SODIUM 100 MG PO CAPS
100.0000 mg | ORAL_CAPSULE | Freq: Two times a day (BID) | ORAL | Status: DC
  Administered 2021-08-25 – 2021-08-26 (×3): 100 mg via ORAL
  Filled 2021-08-25 (×3): qty 1

## 2021-08-25 MED ORDER — SODIUM CHLORIDE (PF) 0.9 % IJ SOLN
INTRAMUSCULAR | Status: DC | PRN
  Administered 2021-08-25: 30 mL

## 2021-08-25 MED ORDER — SODIUM CHLORIDE 0.9 % IV SOLN: INTRAVENOUS | Status: DC

## 2021-08-25 MED ORDER — FAMOTIDINE 20 MG PO TABS
20.0000 mg | ORAL_TABLET | Freq: Every day | ORAL | Status: DC
  Administered 2021-08-25 – 2021-08-26 (×2): 20 mg via ORAL
  Filled 2021-08-25 (×2): qty 1

## 2021-08-25 MED ORDER — PROMETHAZINE HCL 25 MG/ML IJ SOLN: 6.2500 mg | INTRAMUSCULAR | Status: DC | PRN

## 2021-08-25 MED ORDER — MENTHOL 3 MG MT LOZG: 1.0000 | LOZENGE | OROMUCOSAL | Status: DC | PRN

## 2021-08-25 MED ORDER — ACETAMINOPHEN 325 MG PO TABS: 325.0000 mg | ORAL_TABLET | Freq: Four times a day (QID) | ORAL | Status: DC | PRN

## 2021-08-25 MED ORDER — HYDROMORPHONE HCL 1 MG/ML IJ SOLN
INTRAMUSCULAR | Status: AC
  Administered 2021-08-25: 0.25 mg via INTRAVENOUS
  Filled 2021-08-25: qty 1

## 2021-08-25 MED ORDER — ZINC SULFATE 220 (50 ZN) MG PO CAPS
220.0000 mg | ORAL_CAPSULE | Freq: Every day | ORAL | Status: DC
  Administered 2021-08-26: 220 mg via ORAL
  Filled 2021-08-25: qty 1

## 2021-08-25 MED ORDER — BUPIVACAINE LIPOSOME 1.3 % IJ SUSP
INTRAMUSCULAR | Status: AC
  Filled 2021-08-25: qty 20

## 2021-08-25 MED ORDER — SODIUM CHLORIDE (PF) 0.9 % IJ SOLN
INTRAMUSCULAR | Status: AC
  Filled 2021-08-25: qty 30

## 2021-08-25 MED ORDER — POVIDONE-IODINE 10 % EX SWAB: 2.0000 "application " | Freq: Once | CUTANEOUS | Status: DC

## 2021-08-25 MED ORDER — HYDROCODONE-ACETAMINOPHEN 7.5-325 MG PO TABS
1.0000 | ORAL_TABLET | ORAL | Status: DC | PRN
  Administered 2021-08-25 (×2): 1 via ORAL
  Administered 2021-08-25 – 2021-08-26 (×5): 2 via ORAL
  Filled 2021-08-25: qty 1
  Filled 2021-08-25: qty 2
  Filled 2021-08-25: qty 1
  Filled 2021-08-25 (×4): qty 2

## 2021-08-25 MED ORDER — ORAL CARE MOUTH RINSE: 15.0000 mL | Freq: Once | OROMUCOSAL | Status: AC

## 2021-08-25 MED ORDER — PHENYLEPHRINE HCL (PRESSORS) 10 MG/ML IV SOLN
INTRAVENOUS | Status: AC
  Filled 2021-08-25: qty 2

## 2021-08-25 MED ORDER — FLAX SEED OIL 1000 MG PO CAPS: 1.0000 | ORAL_CAPSULE | Freq: Every day | ORAL | Status: DC

## 2021-08-25 MED ORDER — BUPIVACAINE-EPINEPHRINE 0.25% -1:200000 IJ SOLN
INTRAMUSCULAR | Status: DC | PRN
  Administered 2021-08-25: 30 mL

## 2021-08-25 MED ORDER — METOCLOPRAMIDE HCL 5 MG PO TABS: 5.0000 mg | ORAL_TABLET | Freq: Three times a day (TID) | ORAL | Status: DC | PRN

## 2021-08-25 MED ORDER — TRAMADOL HCL 50 MG PO TABS
50.0000 mg | ORAL_TABLET | Freq: Four times a day (QID) | ORAL | Status: DC | PRN
Start: 2021-08-25 — End: 2021-08-26
  Filled 2021-08-25 (×2): qty 2

## 2021-08-25 MED ORDER — VITAMIN D3 25 MCG (1000 UNIT) PO TABS
1000.0000 [IU] | ORAL_TABLET | Freq: Every day | ORAL | Status: DC
  Administered 2021-08-25 – 2021-08-26 (×2): 1000 [IU] via ORAL
  Filled 2021-08-25 (×4): qty 1

## 2021-08-25 MED ORDER — OYSTER SHELL CALCIUM/D3 500-5 MG-MCG PO TABS
1.0000 | ORAL_TABLET | Freq: Every day | ORAL | Status: DC
  Administered 2021-08-25 – 2021-08-26 (×2): 1 via ORAL
  Filled 2021-08-25 (×2): qty 1

## 2021-08-25 MED ORDER — BUPIVACAINE IN DEXTROSE 0.75-8.25 % IT SOLN
INTRATHECAL | Status: DC | PRN
  Administered 2021-08-25: 1.6 mL via INTRATHECAL

## 2021-08-25 MED ORDER — TURMERIC 500 MG PO CAPS: 1.0000 | ORAL_CAPSULE | Freq: Every day | ORAL | Status: DC

## 2021-08-25 MED ORDER — CINNAMON 500 MG PO CAPS: 500.0000 mg | ORAL_CAPSULE | Freq: Two times a day (BID) | ORAL | Status: DC

## 2021-08-25 MED ORDER — CHLORHEXIDINE GLUCONATE 0.12 % MT SOLN
15.0000 mL | Freq: Once | OROMUCOSAL | Status: AC
  Administered 2021-08-25: 15 mL via OROMUCOSAL

## 2021-08-25 MED ORDER — OXYCODONE HCL 5 MG PO TABS
5.0000 mg | ORAL_TABLET | ORAL | Status: DC | PRN
  Administered 2021-08-25: 5 mg via ORAL
  Filled 2021-08-25: qty 1

## 2021-08-25 MED ORDER — OXYCODONE HCL 5 MG PO TABS
5.0000 mg | ORAL_TABLET | Freq: Once | ORAL | Status: AC | PRN
  Administered 2021-08-25: 5 mg via ORAL

## 2021-08-25 MED ORDER — IBUPROFEN 400 MG PO TABS: 800.0000 mg | ORAL_TABLET | Freq: Three times a day (TID) | ORAL | Status: DC | PRN

## 2021-08-25 MED ORDER — TRAMADOL HCL 50 MG PO TABS: 50.0000 mg | ORAL_TABLET | Freq: Four times a day (QID) | ORAL | Status: DC | PRN

## 2021-08-25 MED ORDER — ADULT MULTIVITAMIN W/MINERALS CH
1.0000 | ORAL_TABLET | Freq: Every day | ORAL | Status: DC
  Administered 2021-08-25 – 2021-08-26 (×2): 1 via ORAL
  Filled 2021-08-25 (×2): qty 1

## 2021-08-25 MED ORDER — MIDAZOLAM HCL 2 MG/2ML IJ SOLN
INTRAMUSCULAR | Status: AC
  Filled 2021-08-25: qty 2

## 2021-08-25 MED ORDER — FENTANYL CITRATE (PF) 100 MCG/2ML IJ SOLN
INTRAMUSCULAR | Status: AC
  Filled 2021-08-25: qty 2

## 2021-08-25 MED ORDER — MIDAZOLAM HCL 5 MG/5ML IJ SOLN
INTRAMUSCULAR | Status: DC | PRN
  Administered 2021-08-25: 2 mg via INTRAVENOUS

## 2021-08-25 MED ORDER — ONDANSETRON HCL 4 MG/2ML IJ SOLN
INTRAMUSCULAR | Status: AC
  Filled 2021-08-25: qty 2

## 2021-08-25 MED ORDER — HYDROCODONE-ACETAMINOPHEN 5-325 MG PO TABS: 1.0000 | ORAL_TABLET | ORAL | Status: DC | PRN

## 2021-08-25 MED ORDER — METHOCARBAMOL 500 MG PO TABS
500.0000 mg | ORAL_TABLET | Freq: Four times a day (QID) | ORAL | Status: DC | PRN
  Administered 2021-08-25 – 2021-08-26 (×3): 500 mg via ORAL
  Filled 2021-08-25 (×3): qty 1

## 2021-08-25 MED ORDER — CEFAZOLIN SODIUM-DEXTROSE 2-4 GM/100ML-% IV SOLN
2.0000 g | INTRAVENOUS | Status: AC
  Administered 2021-08-25: 2 g via INTRAVENOUS
  Filled 2021-08-25: qty 100

## 2021-08-25 MED ORDER — HYDROMORPHONE HCL 1 MG/ML IJ SOLN
0.2500 mg | INTRAMUSCULAR | Status: DC | PRN
  Administered 2021-08-25 (×3): 0.25 mg via INTRAVENOUS

## 2021-08-25 MED ORDER — METHOCARBAMOL 500 MG IVPB - SIMPLE MED
500.0000 mg | Freq: Four times a day (QID) | INTRAVENOUS | Status: DC | PRN
  Administered 2021-08-25: 500 mg via INTRAVENOUS
  Filled 2021-08-25: qty 50

## 2021-08-25 MED ORDER — LIDOCAINE HCL (PF) 2 % IJ SOLN
INTRAMUSCULAR | Status: AC
  Filled 2021-08-25: qty 5

## 2021-08-25 MED ORDER — LAMOTRIGINE 100 MG PO TABS
100.0000 mg | ORAL_TABLET | Freq: Two times a day (BID) | ORAL | Status: DC
  Administered 2021-08-25 – 2021-08-26 (×2): 100 mg via ORAL
  Filled 2021-08-25 (×2): qty 1

## 2021-08-25 MED ORDER — METOCLOPRAMIDE HCL 5 MG/ML IJ SOLN
5.0000 mg | Freq: Three times a day (TID) | INTRAMUSCULAR | Status: DC | PRN
  Administered 2021-08-25: 5 mg via INTRAVENOUS
  Filled 2021-08-25: qty 2

## 2021-08-25 MED ORDER — MIDAZOLAM HCL 2 MG/2ML IJ SOLN: 0.5000 mg | Freq: Once | INTRAMUSCULAR | Status: DC | PRN

## 2021-08-25 MED ORDER — ONDANSETRON HCL 4 MG/2ML IJ SOLN: 4.0000 mg | Freq: Four times a day (QID) | INTRAMUSCULAR | Status: DC | PRN

## 2021-08-25 MED ORDER — TROLAMINE SALICYLATE 10 % EX CREA: 1.0000 "application " | TOPICAL_CREAM | CUTANEOUS | Status: DC | PRN

## 2021-08-25 MED ORDER — CLORAZEPATE DIPOTASSIUM 3.75 MG PO TABS: 3.7500 mg | ORAL_TABLET | Freq: Every day | ORAL | Status: DC | PRN

## 2021-08-25 SURGICAL SUPPLY — 43 items
ATTUNE MED DOME PAT 32 KNEE (Knees) ×2 IMPLANT
ATTUNE PSFEM LTSZ6 NARCEM KNEE (Femur) ×2 IMPLANT
ATTUNE PSRP INSR SZ6 12 KNEE (Insert) ×2 IMPLANT
BASE TIBIAL ROT PLAT SZ 5 KNEE (Knees) ×1 IMPLANT
BLADE SAG 18X100X1.27 (BLADE) ×2 IMPLANT
BLADE SAW SGTL 13X75X1.27 (BLADE) ×2 IMPLANT
BNDG ELASTIC 6X10 VLCR STRL LF (GAUZE/BANDAGES/DRESSINGS) ×2 IMPLANT
BNDG GAUZE ELAST 4 BULKY (GAUZE/BANDAGES/DRESSINGS) ×2 IMPLANT
BOWL SMART MIX CTS (DISPOSABLE) ×2 IMPLANT
CEMENT HV SMART SET (Cement) ×4 IMPLANT
COVER SURGICAL LIGHT HANDLE (MISCELLANEOUS) ×2 IMPLANT
CUFF TOURN SGL QUICK 34 (TOURNIQUET CUFF) ×1
CUFF TRNQT CYL 34X4.125X (TOURNIQUET CUFF) ×1 IMPLANT
DRAPE SHEET LG 3/4 BI-LAMINATE (DRAPES) ×2 IMPLANT
DRAPE U-SHAPE 47X51 STRL (DRAPES) ×2 IMPLANT
DRSG ADAPTIC 3X8 NADH LF (GAUZE/BANDAGES/DRESSINGS) ×2 IMPLANT
DRSG PAD ABDOMINAL 8X10 ST (GAUZE/BANDAGES/DRESSINGS) ×2 IMPLANT
DURAPREP 26ML APPLICATOR (WOUND CARE) ×2 IMPLANT
ELECT REM PT RETURN 15FT ADLT (MISCELLANEOUS) ×2 IMPLANT
GAUZE SPONGE 4X4 12PLY STRL (GAUZE/BANDAGES/DRESSINGS) ×2 IMPLANT
GLOVE SURG ORTHO LTX SZ7.5 (GLOVE) ×2 IMPLANT
GLOVE SURG ORTHO LTX SZ8.5 (GLOVE) ×2 IMPLANT
GLOVE SURG UNDER POLY LF SZ7.5 (GLOVE) ×2 IMPLANT
GLOVE SURG UNDER POLY LF SZ8.5 (GLOVE) ×2 IMPLANT
GOWN STRL REUS W/TWL XL LVL3 (GOWN DISPOSABLE) ×4 IMPLANT
HANDPIECE INTERPULSE COAX TIP (DISPOSABLE) ×1
IMMOBILIZER KNEE 20 (SOFTGOODS) ×2
IMMOBILIZER KNEE 20 THIGH 36 (SOFTGOODS) ×1 IMPLANT
KIT TURNOVER KIT A (KITS) ×2 IMPLANT
MANIFOLD NEPTUNE II (INSTRUMENTS) ×2 IMPLANT
NS IRRIG 1000ML POUR BTL (IV SOLUTION) ×2 IMPLANT
PACK TOTAL KNEE CUSTOM (KITS) ×2 IMPLANT
PROTECTOR NERVE ULNAR (MISCELLANEOUS) ×2 IMPLANT
SET HNDPC FAN SPRY TIP SCT (DISPOSABLE) ×1 IMPLANT
STRIP CLOSURE SKIN 1/2X4 (GAUZE/BANDAGES/DRESSINGS) ×4 IMPLANT
SUT MNCRL AB 3-0 PS2 18 (SUTURE) ×2 IMPLANT
SUT VIC AB 0 CT1 36 (SUTURE) ×2 IMPLANT
SUT VIC AB 1 CT1 36 (SUTURE) ×6 IMPLANT
SUT VIC AB 2-0 CT1 27 (SUTURE) ×1
SUT VIC AB 2-0 CT1 TAPERPNT 27 (SUTURE) ×1 IMPLANT
TIBIAL BASE ROT PLAT SZ 5 KNEE (Knees) ×2 IMPLANT
TRAY FOLEY MTR SLVR 16FR STAT (SET/KITS/TRAYS/PACK) ×2 IMPLANT
WATER STERILE IRR 1000ML POUR (IV SOLUTION) ×4 IMPLANT

## 2021-08-25 NOTE — Progress Notes (Signed)
Orthopedic Tech Progress Note Patient Details:  Desiree Snow November 20, 1950 [id number]  RN called stating " patient was ready to come off CPM" put back on KNEE IMMOBILIZER    Patient ID: Desiree Snow, female   DOB: Oct 14, 1950, 70 y.o.   MRN: [id number]  Janit Pagan 08/25/2021, 11:52 AM

## 2021-08-25 NOTE — Plan of Care (Signed)
  Problem: Education: Goal: Knowledge of General Education information will improve Description Including pain rating scale, medication(s)/side effects and non-pharmacologic comfort measures Outcome: Progressing   

## 2021-08-25 NOTE — Brief Op Note (Signed)
08/25/2021  9:31 AM  PATIENT:  Desiree Snow  70 y.o. female  PRE-OPERATIVE DIAGNOSIS:  left knee endstage osteoarthritis  POST-OPERATIVE DIAGNOSIS:  left knee endstage osteoarthritis  PROCEDURE:  Procedure(s): TOTAL KNEE ARTHROPLASTY (Left) DePuy Attune  SURGEON:  Surgeon(s) and Role:    Netta Cedars, MD - Primary  PHYSICIAN ASSISTANT:   ASSISTANTS: Ventura Bruns, PA-C   ANESTHESIA:   regional and spinal  EBL:  50 mL   BLOOD ADMINISTERED:none  DRAINS: none   LOCAL MEDICATIONS USED:  MARCAINE     SPECIMEN:  No Specimen  DISPOSITION OF SPECIMEN:  N/A  COUNTS:  YES  TOURNIQUET:   Total Tourniquet Time Documented: Thigh (Left) - 86 minutes Total: Thigh (Left) - 86 minutes   DICTATION: .Other Dictation: Dictation Number [id number]  PLAN OF CARE: Admit for overnight observation  PATIENT DISPOSITION:  PACU - hemodynamically stable.   Delay start of Pharmacological VTE agent (>24hrs) due to surgical blood loss or risk of bleeding: no

## 2021-08-25 NOTE — Progress Notes (Signed)
Patient stated that oral Oxycodone and IV Dilaudid did not help with her pain. Called [place], [place] on call to make her aware about those meds not helping the patient. Received verbal order to D/C the Oxycodone and IV Dilaudid and switch to oral Norco for pain and IV Morphine to help with breakthrough pain. Will continue to monitor patient.

## 2021-08-25 NOTE — Anesthesia Procedure Notes (Signed)
Anesthesia Regional Block: Adductor canal block   Pre-Anesthetic Checklist: , timeout performed,  Correct Patient, Correct Site, Correct Laterality,  Correct Procedure, Correct Position, site marked,  Risks and benefits discussed,  Surgical consent,  Pre-op evaluation,  At surgeon's request and post-op pain management  Laterality: Left and Lower  Prep: chloraprep       Needles:  Injection technique: Single-shot  Needle Type: Echogenic Needle     Needle Length: 9cm  Needle Gauge: 21     Additional Needles:   Procedures:,,,, ultrasound used (permanent image in chart),,    Narrative:  Start time: 08/25/2021 7:09 AM End time: 08/25/2021 7:15 AM Injection made incrementally with aspirations every 5 mL.  Performed by: Personally  Anesthesiologist: Annye Asa, MD  Additional Notes: Pt identified in Holding room.  Monitors applied. Working IV access confirmed. Sterile prep L thigh.  #21ga ECHOgenic Arrow block needle into adductor canal with US guidance.  20cc 0.5% Ropivacaine injected incrementally after negative test dose.  Patient asymptomatic, VSS, no heme aspirated, tolerated well.   Jenita Seashore, MD

## 2021-08-25 NOTE — Anesthesia Postprocedure Evaluation (Signed)
Anesthesia Post Note  Patient: Desiree Snow  Procedure(s) Performed: TOTAL KNEE ARTHROPLASTY (Left: Knee)     Patient location during evaluation: PACU Anesthesia Type: Spinal Level of consciousness: awake and alert, patient cooperative and oriented Pain management: pain level controlled Respiratory status: spontaneous breathing, nonlabored ventilation and respiratory function stable Cardiovascular status: blood pressure returned to baseline and stable Postop Assessment: no apparent nausea or vomiting, patient able to bend at knees, spinal receding and adequate PO intake Anesthetic complications: no   No notable events documented.  Last Vitals:  Vitals:   08/25/21 1045 08/25/21 1108  BP: 126/72 (!) 157/77  Pulse: 80 72  Resp: 19 12  Temp:  36.4 C  SpO2: 97% 94%    Last Pain:  Vitals:   08/25/21 1157  TempSrc:   PainSc: 5                  Mostafa Yuan,E. Deberah Adolf

## 2021-08-25 NOTE — Progress Notes (Signed)
Orthopedics Progress Note  Subjective: Patient complaining of severe pain, medicines have not helped  Objective:  Vitals:   08/25/21 1108 08/25/21 1300  BP: (!) 157/77 (!) 102/48  Pulse: 72 (!) 51  Resp: 12 16  Temp: 97.6 F (36.4 C) 97.8 F (36.6 C)  SpO2: 94% 91%    General: Awake and alert  Musculoskeletal: able to wiggle her toes Neurovascularly intact  Lab Results  Component Value Date   WBC 11.6 (H) 08/18/2021   HGB 13.4 08/18/2021   HCT 41.3 08/18/2021   MCV 94.9 08/18/2021   PLT 398 08/18/2021       Component Value Date/Time   NA 138 04/20/2021 0956   NA 140 11/22/2016 0000   K 4.6 04/20/2021 0956   CL 104 04/20/2021 0956   CO2 27 04/20/2021 0956   GLUCOSE 105 (H) 04/20/2021 0956   BUN 14 04/20/2021 0956   BUN 16 11/22/2016 0000   CREATININE 1.00 04/20/2021 0956   CALCIUM 9.6 04/20/2021 0956   GFRNONAA >60 04/20/2021 0956   GFRAA >60 02/26/2020 1200    No results found for: INR, PROTIME  Assessment/Plan: PO s/p Procedure(s): TOTAL KNEE ARTHROPLASTY Pain control is a problem. Changing to Morphine for IV breakthrough pain. Also changing to Hydrocodone for pain control   Doran Heater. Veverly Fells, MD 08/25/2021 6:09 PM

## 2021-08-25 NOTE — Interval H&P Note (Signed)
History and Physical Interval Note:  08/25/2021 7:37 AM  Desiree Snow  has presented today for surgery, with the diagnosis of left knee endstage osteoarthritis.  The various methods of treatment have been discussed with the patient and family. After consideration of risks, benefits and other options for treatment, the patient has consented to  Procedure(s): TOTAL KNEE ARTHROPLASTY (Left) as a surgical intervention.  The patient's history has been reviewed, patient examined, no change in status, stable for surgery.  I have reviewed the patient's chart and labs.  Questions were answered to the patient's satisfaction.     Augustin Schooling

## 2021-08-25 NOTE — Progress Notes (Signed)
Orthopedic Tech Progress Note Patient Details:  RAYHANA SLIDER Jul 23, 1951 [id number]  PACU RN called requesting a CPM and BONE FOAM for patient, applied in PACU   CPM Left Knee CPM Left Knee: On Left Knee Flexion (Degrees): 0 Left Knee Extension (Degrees): 60 Additional Comments: fresh ice  Post Interventions Patient Tolerated: Well Instructions Provided: Care of device Ortho Devices Type of Ortho Device: Bone foam zero knee Ortho Device/Splint Interventions: Ordered   Post Interventions Patient Tolerated: Well Instructions Provided: Care of device  Janit Pagan 08/25/2021, 10:52 AM

## 2021-08-25 NOTE — Anesthesia Procedure Notes (Signed)
Spinal  Patient location during procedure: OR End time: 08/25/2021 7:30 AM Reason for block: surgical anesthesia Staffing Performed: resident/CRNA  Resident/CRNA: Hedda Slade, CRNA Preanesthetic Checklist Completed: patient identified, IV checked, site marked, risks and benefits discussed, surgical consent, monitors and equipment checked, pre-op evaluation and timeout performed Spinal Block Patient position: sitting Prep: DuraPrep and site prepped and draped Patient monitoring: blood pressure, continuous pulse ox, cardiac monitor and heart rate Approach: midline Location: L3-4 Injection technique: single-shot Needle Needle type: Pencan and Introducer  Needle gauge: 24 G Needle length: 9 cm Assessment Events: CSF return Additional Notes 12 mg 0.75% Bupivacaine with dextrose Present throughout

## 2021-08-25 NOTE — Discharge Instructions (Addendum)
Ice to the knee constantly.  Keep the incision covered and clean and dry for one week, then ok to get it wet in the shower.  Do exercise as instructed every hour, please to prevent stiffness.    DO NOT prop anything under the knee, it will make your knee stiff.  Prop under the ankle to encourage your knee to go straight.   Use the walker while you are up and around for balance.  Wear your support stockings 24/7 to prevent blood clots and take baby aspirin twice daily for 30 days also to prevent blood clots  Follow up with Dr Veverly Fells in two weeks in the office, call [phone] for appt    Call Dr Veverly Fells on his cell phone with any questions [phone]  Be as active as you can, use your walker for balance.  INSTRUCTIONS AFTER JOINT REPLACEMENT   Remove items at home which could result in a fall. This includes throw rugs or furniture in walking pathways ICE to the affected joint every three hours while awake for 30 minutes at a time, for at least the first 3-5 days, and then as needed for pain and swelling.  Continue to use ice for pain and swelling. You may notice swelling that will progress down to the foot and ankle.  This is normal after surgery.  Elevate your leg when you are not up walking on it.   Continue to use the breathing machine you got in the hospital (incentive spirometer) which will help keep your temperature down.  It is common for your temperature to cycle up and down following surgery, especially at night when you are not up moving around and exerting yourself.  The breathing machine keeps your lungs expanded and your temperature down.   DIET:  As you were doing prior to hospitalization, we recommend a well-balanced diet.  DRESSING / WOUND CARE / SHOWERING  You may change your dressing 3-5 days after surgery.  Then change the dressing every day with sterile gauze.  Please use good hand washing techniques before changing the dressing.  Do not use any lotions or creams on the  incision until instructed by your surgeon.  ACTIVITY  Increase activity slowly as tolerated, but follow the weight bearing instructions below.   No driving for 6 weeks or until further direction given by your physician.  You cannot drive while taking narcotics.  No lifting or carrying greater than 10 lbs. until further directed by your surgeon. Avoid periods of inactivity such as sitting longer than an hour when not asleep. This helps prevent blood clots.  You may return to work once you are authorized by your doctor.     WEIGHT BEARING   Weight bearing as tolerated with assist device (walker, cane, etc) as directed, use it as long as suggested by your surgeon or therapist, typically at least 4-6 weeks.   EXERCISES  Results after joint replacement surgery are often greatly improved when you follow the exercise, range of motion and muscle strengthening exercises prescribed by your doctor. Safety measures are also important to protect the joint from further injury. Any time any of these exercises cause you to have increased pain or swelling, decrease what you are doing until you are comfortable again and then slowly increase them. If you have problems or questions, call your caregiver or physical therapist for advice.   Rehabilitation is important following a joint replacement. After just a few days of immobilization, the muscles of the leg  can become weakened and shrink (atrophy).  These exercises are designed to build up the tone and strength of the thigh and leg muscles and to improve motion. Often times heat used for twenty to thirty minutes before working out will loosen up your tissues and help with improving the range of motion but do not use heat for the first two weeks following surgery (sometimes heat can increase post-operative swelling).   These exercises can be done on a training (exercise) mat, on the floor, on a table or on a bed. Use whatever works the best and is most  comfortable for you.    Use music or television while you are exercising so that the exercises are a pleasant break in your day. This will make your life better with the exercises acting as a break in your routine that you can look forward to.   Perform all exercises about fifteen times, three times per day or as directed.  You should exercise both the operative leg and the other leg as well.  Exercises include:   Quad Sets - Tighten up the muscle on the front of the thigh (Quad) and hold for 5-10 seconds.   Straight Leg Raises - With your knee straight (if you were given a brace, keep it on), lift the leg to 60 degrees, hold for 3 seconds, and slowly lower the leg.  Perform this exercise against resistance later as your leg gets stronger.  Leg Slides: Lying on your back, slowly slide your foot toward your buttocks, bending your knee up off the floor (only go as far as is comfortable). Then slowly slide your foot back down until your leg is flat on the floor again.  Angel Wings: Lying on your back spread your legs to the side as far apart as you can without causing discomfort.  Hamstring Strength:  Lying on your back, push your heel against the floor with your leg straight by tightening up the muscles of your buttocks.  Repeat, but this time bend your knee to a comfortable angle, and push your heel against the floor.  You may put a pillow under the heel to make it more comfortable if necessary.   A rehabilitation program following joint replacement surgery can speed recovery and prevent re-injury in the future due to weakened muscles. Contact your doctor or a physical therapist for more information on knee rehabilitation.    CONSTIPATION  Constipation is defined medically as fewer than three stools per week and severe constipation as less than one stool per week.  Even if you have a regular bowel pattern at home, your normal regimen is likely to be disrupted due to multiple reasons following surgery.   Combination of anesthesia, postoperative narcotics, change in appetite and fluid intake all can affect your bowels.   YOU MUST use at least one of the following options; they are listed in order of increasing strength to get the job done.  They are all available over the counter, and you may need to use some, POSSIBLY even all of these options:    Drink plenty of fluids (prune juice may be helpful) and high fiber foods Colace 100 mg by mouth twice a day  Senokot for constipation as directed and as needed Dulcolax (bisacodyl), take with full glass of water  Miralax (polyethylene glycol) once or twice a day as needed.  If you have tried all these things and are unable to have a bowel movement in the first 3-4 days after surgery  call either your surgeon or your primary doctor.    If you experience loose stools or diarrhea, hold the medications until you stool forms back up.  If your symptoms do not get better within 1 week or if they get worse, check with your doctor.  If you experience "the worst abdominal pain ever" or develop nausea or vomiting, please contact the office immediately for further recommendations for treatment.   ITCHING:  If you experience itching with your medications, try taking only a single pain pill, or even half a pain pill at a time.  You can also use Benadryl over the counter for itching or also to help with sleep.   TED HOSE STOCKINGS:  Use stockings on both legs until for at least 2 weeks or as directed by physician office. They may be removed at night for sleeping.  MEDICATIONS:  See your medication summary on the "After Visit Summary" that nursing will review with you.  You may have some home medications which will be placed on hold until you complete the course of blood thinner medication.  It is important for you to complete the blood thinner medication as prescribed.  PRECAUTIONS:  If you experience chest pain or shortness of breath - call 911 immediately for  transfer to the hospital emergency department.   If you develop a fever greater that 101 F, purulent drainage from wound, increased redness or drainage from wound, foul odor from the wound/dressing, or calf pain - CONTACT YOUR SURGEON.                                                   FOLLOW-UP APPOINTMENTS:  If you do not already have a post-op appointment, please call the office for an appointment to be seen by your surgeon.  Guidelines for how soon to be seen are listed in your "After Visit Summary", but are typically between 1-4 weeks after surgery.  OTHER INSTRUCTIONS:   Knee Replacement:  Do not place pillow under knee, focus on keeping the knee straight while resting. CPM instructions: 0-90 degrees, 2 hours in the morning, 2 hours in the afternoon, and 2 hours in the evening. Place foam block, curve side up under heel at all times except when in CPM or when walking.  DO NOT modify, tear, cut, or change the foam block in any way.  POST-OPERATIVE OPIOID TAPER INSTRUCTIONS: It is important to wean off of your opioid medication as soon as possible. If you do not need pain medication after your surgery it is ok to stop day one. Opioids include: Codeine, Hydrocodone(Norco, Vicodin), Oxycodone(Percocet, oxycontin) and hydromorphone amongst others.  Long term and even short term use of opiods can cause: Increased pain response Dependence Constipation Depression Respiratory depression And more.  Withdrawal symptoms can include Flu like symptoms Nausea, vomiting And more Techniques to manage these symptoms Hydrate well Eat regular healthy meals Stay active Use relaxation techniques(deep breathing, meditating, yoga) Do Not substitute Alcohol to help with tapering If you have been on opioids for less than two weeks and do not have pain than it is ok to stop all together.  Plan to wean off of opioids This plan should start within one week post op of your joint replacement. Maintain the  same interval or time between taking each dose and first decrease the dose.  Cut the  total daily intake of opioids by one tablet each day Next start to increase the time between doses. The last dose that should be eliminated is the evening dose.   MAKE SURE YOU:  Understand these instructions.  Get help right away if you are not doing well or get worse.    Thank you for letting us be a part of your medical care team.  It is a privilege we respect greatly.  We hope these instructions will help you stay on track for a fast and full recovery!

## 2021-08-25 NOTE — TOC Transition Note (Signed)
Transition of Care [facility]) - CM/SW Discharge Note   Patient Details  Name: Desiree Snow MRN: [id number] Date of Birth: 1951-04-16  Transition of Care [facility]) CM/SW Contact:  Lennart Pall, LCSW Phone Number: 08/25/2021, 2:09 PM   Clinical Narrative:    Met with pt today to review dc needs.  Pt reports she does not have any DME at home.  Orders placed for rw and 3n1 commode and pt without any agency preference - referral placed with [employer] and DME delivered to pt's room today.  Pt, also, requesting HHPT to start and MD has approved and placed orders.  Again, no agency preference and referral placed with [facility].  Pt hopeful for dc home tomorrow.  No further TOC needs.  Please reorder if new needs arise.   Final next level of care: [place] Barriers to Discharge: No Barriers Identified   Patient Goals and CMS Choice Patient states their goals for this hospitalization and ongoing recovery are:: return home      Discharge Placement                       Discharge Plan and Services                DME Arranged: 3-N-1, Walker rolling DME Agency: [employer] Date DME Agency Contacted: 08/25/21 Time DME Agency Contacted: [phone] Representative spoke with at DME Agency: [facility]: PT [place]: [place] Date [place]: 08/25/21 Time [place]: 1335 Representative spoke with at [place]: [place] Interventions     Readmission Risk Interventions No flowsheet data found.

## 2021-08-25 NOTE — Evaluation (Signed)
Physical Therapy Evaluation Patient Details Name: Desiree Snow MRN: [id number] DOB: 01-Jan-1951 Today's Date: 08/25/2021  History of Present Illness  Pt is 70 yo female admitted on 08/25/21 for L TKA.  Pt with hx including diverticulosis, GERD, knee surgery 2021.  Clinical Impression  Pt is s/p TKA resulting in the deficits listed below (see PT Problem List). At baseline, pt is independent.  She lives alone and will only have assist PRN at discharge from neighbors.  Evaluation today was limited due to extreme pain.  Pt reports >10/10 and was symptomatic with irritability, shivering, restless, grimacing.  Attempted multiple positions and CPM to relieve but no real relief.  Ended up with pt positioned in supine with pillow under leg to elevate - did not place immobilizer due to extreme pain and pt needing to move/shift around. RN aware and reached out to providers for other options.  Pt will benefit from skilled PT to increase their independence and safety with mobility to allow discharge to the venue listed below.         Recommendations for follow up therapy are one component of a multi-disciplinary discharge planning process, led by the attending physician.  Recommendations may be updated based on patient status, additional functional criteria and insurance authorization.  Follow Up Recommendations Follow physician's recommendations for discharge plan and follow up therapies    Assistance Recommended at Discharge Frequent or constant Supervision/Assistance  Functional Status Assessment Patient has had a recent decline in their functional status and demonstrates the ability to make significant improvements in function in a reasonable and predictable amount of time.  Equipment Recommendations  Other (comment) (Needed RW and BSC but they have already been delivered in room)    Recommendations for Other Services       Precautions / Restrictions Precautions Precautions:  Fall Restrictions Weight Bearing Restrictions: Yes LLE Weight Bearing: Weight bearing as tolerated      Mobility  Bed Mobility Overal bed mobility: Needs Assistance Bed Mobility: Supine to Sit;Sit to Supine     Supine to sit: Min assist Sit to supine: Min assist   General bed mobility comments: Min A for L LE    Transfers                   General transfer comment: deferred due to pain; did walk to bathroom earlier with nursing staff per pt    Ambulation/Gait                  Stairs            Wheelchair Mobility    Modified Rankin (Stroke Patients Only)       Balance Overall balance assessment: Needs assistance Sitting-balance support: No upper extremity supported Sitting balance-Leahy Scale: Good         Standing balance comment: deferred due to pain                             Pertinent Vitals/Pain Pain Assessment: 0-10 Pain Score: 10-Worst pain ever Pain Location: L knee Pain Descriptors / Indicators: Sharp;Shooting Pain Intervention(s): Limited activity within patient's tolerance;Monitored during session;Premedicated before session;Repositioned;Relaxation;Ice applied    Home Living Family/patient expects to be discharged to:: Private residence Living Arrangements: Alone Available Help at Discharge: Neighbor;Available PRN/intermittently Type of Home:  (town home) Home Access: Stairs to enter Entrance Stairs-Rails: None Technical brewer of Steps: 1 step to enter   Home Layout: One level Home Equipment:  Cane - single Associate Professor (2 wheels) Additional Comments: RW and BSC have been delievered    Prior Function Prior Level of Function : Independent/Modified Independent;Driving             Mobility Comments: Pt was using cane; could ambulate in community but was painful ADLs Comments: independent     Hand Dominance        Extremity/Trunk Assessment   Upper Extremity  Assessment Upper Extremity Assessment: Overall WFL for tasks assessed    Lower Extremity Assessment Lower Extremity Assessment: LLE deficits/detail;RLE deficits/detail RLE Deficits / Details: ROM WFL; MMT 5/5 LLE Deficits / Details: Very limited by pain post op; ROM: ankle and hip WFL, knee 10 to 30 degrees, reports always sits with knee bent at baseline; MMT: ankle 5/5, knee and hip 2/5 LLE Sensation: WNL    Cervical / Trunk Assessment Cervical / Trunk Assessment: Normal  Communication   Communication: No difficulties  Cognition Arousal/Alertness: Awake/alert Behavior During Therapy: Agitated Overall Cognitive Status: Within Functional Limits for tasks assessed                                          General Comments General comments (skin integrity, edema, etc.): Pt  in severe pain with shivering, irritability, grimacing, and restlessness.  She had received pain medications and reports are not working and she wants something different (pt had dilaudid and oxycodone).  RN present and pt notified her - RN following up with provider.  PT tried different positions for pain. Tried sitting EOB with intention of moving to chair but pain increased.  Tried CPM 0-40 tolerating initially, then decreased to 0-30 , pt reports worse than laying flat.  Removed CPM, pt in supine, placed pillow under entire leg to elevate.  Did not place in knee immobilizer due to extreme pain.  Pt also externally rotating at hip - educated that not ideal position but due to severe pain at this time, trying to get position of comfort until pain decreased.  Ice packs to knee. Also, provided warm blankets to pt. Pt asking if nurse has gotten another med yet - educated RN has to get in touch with MD/PA , get order , and then get new meds from pharmacy if MD/PA change order.  Pt also asking "how long will it be like this?," "How long before I can cross my foot over my leg?" Educated that all people are  different in regards to pain and recovery and unable to give specific timeframe.  Did discuss her current pain level/symptoms are not typical and tried multiple positions to ease.     Exercises     Assessment/Plan    PT Assessment Patient needs continued PT services  PT Problem List Decreased strength;Decreased mobility;Decreased safety awareness;Decreased range of motion;Decreased activity tolerance;Decreased knowledge of use of DME;Decreased balance;Pain       PT Treatment Interventions DME instruction;Therapeutic activities;Modalities;Gait training;Therapeutic exercise;Patient/family education;Stair training;Balance training;Functional mobility training    PT Goals (Current goals can be found in the Care Plan section)  Acute Rehab PT Goals Patient Stated Goal: decrease pain; "no pt should have to feel this." PT Goal Formulation: With patient Time For Goal Achievement: 09/08/21 Potential to Achieve Goals: Good Additional Goals Additional Goal #1: L leg pain to 5/10 to be able to ambulate    Frequency 7X/week   Barriers to discharge Decreased caregiver support;Other (comment) pain  Co-evaluation               AM-PAC PT "6 Clicks" Mobility  Outcome Measure Help needed turning from your back to your side while in a flat bed without using bedrails?: A Little Help needed moving from lying on your back to sitting on the side of a flat bed without using bedrails?: A Little Help needed moving to and from a bed to a chair (including a wheelchair)?: A Little Help needed standing up from a chair using your arms (e.g., wheelchair or bedside chair)?: A Little Help needed to walk in hospital room?: A Little Help needed climbing 3-5 steps with a railing? : A Lot 6 Click Score: 17    End of Session   Activity Tolerance: Patient limited by pain Patient left: in bed;with call bell/phone within reach;with bed alarm set Nurse Communication: Mobility status PT Visit Diagnosis:  Other abnormalities of gait and mobility (R26.89);Muscle weakness (generalized) (M62.81);Pain Pain - Right/Left: Left Pain - part of body: Knee    Time: 0300-9233 PT Time Calculation (min) (ACUTE ONLY): 39 min   Charges:   PT Evaluation $PT Eval Moderate Complexity: 1 Mod PT Treatments $Therapeutic Activity: 23-37 mins        Desiree Snow, PT Acute Rehab Services Pager [phone] [facility] [phone]   Desiree Snow 08/25/2021, 3:13 PM

## 2021-08-25 NOTE — Op Note (Signed)
NAME: Desiree Snow, [place] MEDICAL RECORD NO: [id number] ACCOUNT NO: [id number] DATE OF BIRTH: 1951/06/02 FACILITY: [facility]ATION: [facility] PHYSICIAN: Doran Heater. Veverly Fells, MD  Operative Report   DATE OF PROCEDURE: 08/25/2021  PREOPERATIVE DIAGNOSIS:  Left knee end-stage osteoarthritis.  POSTOPERATIVE DIAGNOSIS:  Left knee end-stage osteoarthritis.  PROCEDURE PERFORMED:  Left total knee replacement using DePuy Attune prosthesis.  ATTENDING SURGEON:  Doran Heater. Veverly Fells, MD  ASSISTANT:  Charletta Cousin [place], [place], who was scrubbed in the entire procedure.  ANESTHESIA:  Spinal anesthesia plus adductor canal block was used.  ESTIMATED BLOOD LOSS:  Minimal.  FLUID PLACEMENT:  1200 mL crystalloid.  COUNTS:  Instrument counts were correct.  COMPLICATIONS:  No complications.   ANTIBIOTICS:  Perioperative antibiotics were given.  TOURNIQUET TIME:  1 hour and 10 minutes at 300 mmHg.  INDICATIONS:  The patient is a 70 year old female who presents with a history of end-stage arthritis of the left knee with worsening pain and dysfunction.  The patient has failed an extended period of conservative management and desires total knee  arthroplasty to restore function and eliminate pain.  Informed consent was obtained.  DESCRIPTION OF PROCEDURE:  After an adequate level of anesthesia was achieved, the patient was positioned in the supine position.  Left leg was correctly identified, and a nonsterile tourniquet was placed on the proximal thigh.  Sterile prep and drape of  the left knee performed.  Timeout called verifying correct patient, correct site.  The nonoperative right leg was padded appropriately and secured to the operating room table.  After timeout was called, we elevated the leg and exsanguinated with the  Esmarch bandage elevating the tourniquet to 300 mmHg.  Placed the knee in flexion, performed a longitudinal midline incision with a 10 blade scalpel.  We used a fresh 10 blade for the medial  parapatellar arthrotomy.  We then divided the lateral  patellofemoral ligaments and everted the patella. Entering the distal femur with a step cut drill, we found there to be a complete eburnated bone on the distal femur and on the tibia.  Once we had our intramedullary guide placed, we resected 9 mm off the  distal femur, set on 5 degrees of valgus.  We sized our femur to a size 6 anterior down and performed anterior, posterior, and chamfer cuts with the 4-in-1 block. We removed ACL, PCL, and meniscal tissue subluxing the tibia anteriorly, performing our  tibial cut 90 degrees perpendicular to the long axis of the tibia with minimal posterior slope, resecting 2 mm off the affected medial side.  For the tibial cut, we used an external alignment guide.  Once we had her tibial cut done, we placed a lamina  spreader in between the femur and the tibia. We resected posterior femoral osteophytes off the posterior femoral condyles.  We also released the posterior capsule.  We then injected the posterior capsule with a combination of Marcaine, Exparel, and  saline.  Once we had our posterior aspect of the knee cleaned out, we checked our gaps, which were symmetric at least 6 mm.  Next, we removed our pins from our tibia.  We then completed our tibial preparation with a modular drill and keel punch for the 5  tibia externally rotating component as much as possible.  Once we had our tibia completed, we went to the femur and did our box cut for the 6 narrow left femur.  Once we cut the box, we impacted our trial femur in place and drilled our lug  holes.  We  had good bony support for our component.  We then placed a 6 mm spacer, then went up to a size 8 and placed in the extension while we resurfaced the patella.  We noted the patella to be 21 mm thickness.  We placed the shim on, so we only resected 7 mm  off the patella.  We had a good flush cut.  We drilled lug holes for the 32 patellar button and then took the  knee, placed the trial button in place. We ranged the knee and had excellent patellar tracking and good stability.  We felt like we were  definitely going to go to 10, maybe a 12 based on how the gait felt. We removed all the trial components, pulse irrigated, and then dried the bone.  We vacuum mixed high-viscosity cement on the back table, cemented the components into place, so a 5  tibia, 6 narrow left femur.  We placed an 8 mm spacer in for the cementing. We then used a patellar clamp for the patella while the cement set up.  Once the cement hardened, we removed excess cement with quarter-inch curved osteotome.  We did a thorough  inspection of the entire knee.  We trialed with a 10 and felt like we could get the 12 in, so we selected the real size 6, 12 mm poly spacer and placed that on the tibia.  We reduced the knee and were pleased to see we could get full extension and had  great flexion stability, so we had balanced gaps, despite only resecting 9 mm off the distal femur and 2 mm off the affected medial tibia.  At this point, we irrigated thoroughly.  We injected the anterior capsule with Marcaine, Exparel, saline combo and  then closed the parapatellar arthrotomy with #1 Vicryl suture followed by 2-0 Vicryl for subcutaneous closure and 4-0 Monocryl for skin.  We ranged the knee.  Excellent patellar tracking, good stability.  We applied a sterile compressive bandage.  We  placed the knee in a knee immobilizer and transported the patient to recovery room in stable condition.   [facility] D: 08/25/2021 9:37:04 am T: 08/25/2021 1:02:00 pm  JOB: [id number]/ [id number]

## 2021-08-25 NOTE — Transfer of Care (Signed)
Immediate Anesthesia Transfer of Care Note  Patient: Desiree Snow  Procedure(s) Performed: TOTAL KNEE ARTHROPLASTY (Left: Knee)  Patient Location: PACU  Anesthesia Type:Spinal  Level of Consciousness: awake, alert  and oriented  Airway & Oxygen Therapy: Patient Spontanous Breathing  Post-op Assessment: Report given to RN and Post -op Vital signs reviewed and stable  Post vital signs: Reviewed and stable  Last Vitals:  Vitals Value Taken Time  BP 95/53 08/25/21 0935  Temp    Pulse 80 08/25/21 0937  Resp    SpO2 99 % 08/25/21 0937  Vitals shown include unvalidated device data.  Last Pain:  Vitals:   08/25/21 0556  TempSrc: Oral         Complications: No notable events documented.

## 2021-08-25 NOTE — Plan of Care (Signed)
  Problem: Education: Goal: Knowledge of General Education information will improve Description: Including pain rating scale, medication(s)/side effects and non-pharmacologic comfort measures Outcome: Progressing   Problem: Nutrition: Goal: Adequate nutrition will be maintained Outcome: Progressing   Problem: Coping: Goal: Level of anxiety will decrease Outcome: Progressing   

## 2021-08-26 DIAGNOSIS — M1712 Unilateral primary osteoarthritis, left knee: Secondary | ICD-10-CM | POA: Diagnosis not present

## 2021-08-26 LAB — CBC
HCT: 33.4 % — ABNORMAL LOW (ref 36.0–46.0)
Hemoglobin: 10.9 g/dL — ABNORMAL LOW (ref 12.0–15.0)
MCH: 30.7 pg (ref 26.0–34.0)
MCHC: 32.6 g/dL (ref 30.0–36.0)
MCV: 94.1 fL (ref 80.0–100.0)
Platelets: 324 10*3/uL (ref 150–400)
RBC: 3.55 MIL/uL — ABNORMAL LOW (ref 3.87–5.11)
RDW: 13 % (ref 11.5–15.5)
WBC: 18.3 10*3/uL — ABNORMAL HIGH (ref 4.0–10.5)
nRBC: 0 % (ref 0.0–0.2)

## 2021-08-26 LAB — BASIC METABOLIC PANEL
Anion gap: 7 (ref 5–15)
BUN: 12 mg/dL (ref 8–23)
CO2: 25 mmol/L (ref 22–32)
Calcium: 8.5 mg/dL — ABNORMAL LOW (ref 8.9–10.3)
Chloride: 101 mmol/L (ref 98–111)
Creatinine, Ser: 0.82 mg/dL (ref 0.44–1.00)
GFR, Estimated: >60 mL/min (ref >60)
Glucose, Bld: 149 mg/dL — ABNORMAL HIGH (ref 70–99)
Potassium: 3.8 mmol/L (ref 3.5–5.1)
Sodium: 133 mmol/L — ABNORMAL LOW (ref 135–145)

## 2021-08-26 NOTE — TOC Transition Note (Addendum)
Transition of Care [facility]) - CM/SW Discharge Note   Patient Details  Name: Desiree Snow MRN: [id number] Date of Birth: September 04, 1951  Transition of Care [facility]) CM/SW Contact:  Ross Ludwig, LCSW Phone Number: 08/26/2021, 3:34 PM   Clinical Narrative:     Patient will be going home with home health PT and Aide through [facility].  CSW signing off please reconsult with any other social work needs, home health agency has been notified of planned discharge.   Final next level of care: [place] Barriers to Discharge: Barriers Resolved   Patient Goals and CMS Choice Patient states their goals for this hospitalization and ongoing recovery are:: To return back home with home health CMS Medicare.gov Compare Post Acute Care list provided to:: Patient Choice offered to / list presented to : Patient  Discharge Placement  Patient discharging back home.                     Discharge Plan and Services                DME Arranged: 3-N-1, Walker rolling DME Agency: [employer] Date DME Agency Contacted: 08/24/21 Time DME Agency Contacted: 37 Representative spoke with at DME Agency: [place]: PT, Nurse's Aide [place] Agency: [place] Date [place]: 08/26/21 Time [place]: 50 Representative spoke with at [place]: Amy  Social Determinants of Health ([place]) Interventions     Readmission Risk Interventions No flowsheet data found.

## 2021-08-26 NOTE — Progress Notes (Signed)
Orthopedics Progress Note  Subjective: Pain better controlled today  Objective:  Vitals:   08/26/21 0200 08/26/21 0504  BP: 126/61 128/64  Pulse: 98 90  Resp: 18 18  Temp: 98.5 F (36.9 C) 99.4 F (37.4 C)  SpO2: 93% 94%    General: Awake and alert  Musculoskeletal: Left knee dressing CDI Neurovascularly intact  Lab Results  Component Value Date   WBC 18.3 (H) 08/26/2021   HGB 10.9 (L) 08/26/2021   HCT 33.4 (L) 08/26/2021   MCV 94.1 08/26/2021   PLT 324 08/26/2021       Component Value Date/Time   NA 133 (L) 08/26/2021 0318   NA 140 11/22/2016 0000   K 3.8 08/26/2021 0318   CL 101 08/26/2021 0318   CO2 25 08/26/2021 0318   GLUCOSE 149 (H) 08/26/2021 0318   BUN 12 08/26/2021 0318   BUN 16 11/22/2016 0000   CREATININE 0.82 08/26/2021 0318   CALCIUM 8.5 (L) 08/26/2021 0318   GFRNONAA >60 08/26/2021 0318   GFRAA >60 02/26/2020 1200    No results found for: INR, PROTIME  Assessment/Plan: POD #1 s/p Procedure(s): TOTAL KNEE ARTHROPLASTY Stable this morning after severe pain yesterday evening,. Patient lives alone with limited support at home. She understands that she will need to clear PT prior to discharge and be independent with ADLs and safe to be alone.  She will also need to be comfortable on PO meds prior to D/C Additionally she will need home health PT for two weeks - we had planned for outpatient but given her support issues In Home makes sense.  Follow up with me in two weeks DVT prophylaxis with TED/SCDs and aspirin Pulm toilet  Doran Heater. Veverly Fells, MD 08/26/2021 8:18 AM

## 2021-08-26 NOTE — Discharge Summary (Signed)
In most cases prophylactic antibiotics for Dental procdeures after total joint surgery are not necessary.  Exceptions are as follows:  1. History of prior total joint infection  2. Severely immunocompromised (Organ Transplant, cancer chemotherapy, Rheumatoid biologic meds such as [place])  3. Poorly controlled diabetes (A1C &gt; 8.0, blood glucose over 200)  If you have one of these conditions, contact your surgeon for an antibiotic prescription, prior to your dental procedure. Orthopedic Discharge Summary        Physician Discharge Summary  Patient ID: Desiree Snow MRN: [id number] DOB/AGE: 07/10/1951 70 y.o.  Admit date: 08/25/2021 Discharge date: 08/26/2021   Procedures:  Procedure(s) (LRB): TOTAL KNEE ARTHROPLASTY (Left)  Attending Physician:  Dr. Esmond Plants  Admission Diagnoses:   left knee end stage osteoarthritis  Discharge Diagnoses:  left knee end stage osteoarthritis   Past Medical History:  Diagnosis Date   Allergy    Arthritis    Chicken pox    Diverticulosis    Family history of uterine cancer 05/05/2021   GERD (gastroesophageal reflux disease)    Goiter    H/O febrile seizure    as infant, x 1, List of AEDs tried: Dilantin, Mysoline, Tegretol, Gabapentine, Depakote,  Keppra, and Lamictal,   Seizures ([place])    "resolved" last seizure in 2002    PCP: [name], [name], MD   Discharged Condition: fair  Hospital Course:  Patient underwent the above stated procedure on 08/25/2021. Patient tolerated the procedure well and brought to the recovery room in good condition and subsequently to the floor. Patient had an uncomplicated hospital course and was stable for discharge. Patient adamant about d/c and met therapy goals   Disposition: Discharge disposition: 01-Home or Self Care      with follow up in 2 weeks    Follow-up Information     Netta Cedars, MD. Call in 2 week(s).   Specialty: Orthopedic Surgery Why: [phone] call  for appt Contact information: [address] [place] [address] [phone]         [facility] Follow up.   Specialty: [place] Why: (Now called "[place]")  to provide home health physical therapy Contact information: [place] [address] [phone]                 Dental Antibiotics:  In most cases prophylactic antibiotics for Dental procdeures after total joint surgery are not necessary.  Exceptions are as follows:  1. History of prior total joint infection  2. Severely immunocompromised (Organ Transplant, cancer chemotherapy, Rheumatoid biologic meds such as [place])  3. Poorly controlled diabetes (A1C &gt; 8.0, blood glucose over 200)  If you have one of these conditions, contact your surgeon for an antibiotic prescription, prior to your dental procedure.  Discharge Instructions     Call MD / Call 911   Complete by: As directed    If you experience chest pain or shortness of breath, CALL 911 and be transported to the hospital emergency room.  If you develope a fever above 101 F, pus (white drainage) or increased drainage or redness at the wound, or calf pain, call your surgeon's office.   Constipation Prevention   Complete by: As directed    Drink plenty of fluids.  Prune juice may be helpful.  You may use a stool softener, such as Colace (over the counter) 100 mg twice a day.  Use MiraLax (over the counter) for constipation as needed.  Diet - low sodium heart healthy   Complete by: As directed    Increase activity slowly as tolerated   Complete by: As directed    Post-operative opioid taper instructions:   Complete by: As directed    POST-OPERATIVE OPIOID TAPER INSTRUCTIONS: It is important to wean off of your opioid medication as soon as possible. If you do not need pain medication after your surgery it is ok to stop day one. Opioids include: Codeine, Hydrocodone(Norco,  Vicodin), Oxycodone(Percocet, oxycontin) and hydromorphone amongst others.  Long term and even short term use of opiods can cause: Increased pain response Dependence Constipation Depression Respiratory depression And more.  Withdrawal symptoms can include Flu like symptoms Nausea, vomiting And more Techniques to manage these symptoms Hydrate well Eat regular healthy meals Stay active Use relaxation techniques(deep breathing, meditating, yoga) Do Not substitute Alcohol to help with tapering If you have been on opioids for less than two weeks and do not have pain than it is ok to stop all together.  Plan to wean off of opioids This plan should start within one week post op of your joint replacement. Maintain the same interval or time between taking each dose and first decrease the dose.  Cut the total daily intake of opioids by one tablet each day Next start to increase the time between doses. The last dose that should be eliminated is the evening dose.          Allergies as of 08/26/2021       Reactions   Cetirizine Other (See Comments)   Cognitive issues and "lowers seizure threshold."   Metronidazole Other (See Comments)   Strange feeling and inability to function   Quinolones    Scopolamine Hives    on her fingers no itchng   Penicillins Rash        Medication List     TAKE these medications    aspirin 81 MG chewable tablet Commonly known as: Aspirin Childrens Chew 1 tablet (81 mg total) by mouth 2 (two) times daily.   CALCIUM 1200 PO Take 1 tablet by mouth daily.   CINNAMON PO Take 100 mg by mouth in the morning and at bedtime.   clorazepate 3.75 MG tablet Commonly known as: TRANXENE Take 3.75 mg by mouth daily as needed for anxiety.   exemestane 25 MG tablet Commonly known as: AROMASIN Take 1 tablet (25 mg total) by mouth daily after breakfast.   famotidine 20 MG tablet Commonly known as: PEPCID Take 20 mg by mouth daily.   FLAX SEED OIL  PO Take 1 capsule by mouth daily.   fluticasone 50 MCG/ACT nasal spray Commonly known as: FLONASE Place 1 spray into both nostrils 2 (two) times daily as needed for allergies or rhinitis. What changed: when to take this   ibuprofen 800 MG tablet Commonly known as: ADVIL Take 1 tablet (800 mg total) by mouth every 8 (eight) hours as needed.   lamoTRIgine 100 MG tablet Commonly known as: LAMICTAL Take 1 tablet (100 mg total) by mouth 2 (two) times daily.   Magnesium 250 MG Tabs Take 250 mg by mouth daily.   methocarbamol 500 MG tablet Commonly known as: Robaxin Take 1 tablet (500 mg total) by mouth every 8 (eight) hours as needed for muscle spasms.   multivitamin tablet Take 1 tablet by mouth daily.   ondansetron 4 MG tablet Commonly known as: Zofran Take 1 tablet (4 mg total) by mouth every 8 (eight) hours as needed for nausea,  vomiting or refractory nausea / vomiting.   oxyCODONE-acetaminophen 5-325 MG tablet Commonly known as: Percocet Take 1 tablet by mouth every 4 (four) hours as needed for severe pain.   traMADol 50 MG tablet Commonly known as: ULTRAM Take 50 mg by mouth every 6 (six) hours as needed for pain.   trolamine salicylate 10 % cream Commonly known as: ASPERCREME Apply 1 application topically as needed for muscle pain.   TURMERIC PO Take 1 tablet by mouth daily.   UNABLE TO FIND Take 1 capsule by mouth daily. Med Name: Collagen 2 otc once daily   Vitamin D3 25 MCG (1000 UT) Caps Take 1,000 Units by mouth daily.   Zinc 50 MG Caps Take 50 mg by mouth daily.               Durable Medical Equipment  (From admission, onward)           Start     Ordered   08/25/21 1301  For home use only DME 3 n 1  Once        08/25/21 1300   08/25/21 1300  For home use only DME Walker rolling  Once       Question Answer Comment  Walker: With 5 Inch Wheels   Patient needs a walker to treat with the following condition H/O total knee replacement, left       08/25/21 1300              Signed: Ventura Bruns 08/26/2021, 2:59 PM  [facility] now [employer] [address] [place], [place], [place] [address] Phone: [phone] Facebook  [employer]

## 2021-08-26 NOTE — Progress Notes (Signed)
Physical Therapy Treatment Patient Details Name: Desiree Snow MRN: [id number] DOB: 19-Feb-1951 Today's Date: 08/26/2021   History of Present Illness Pt is 70 yo female admitted on 08/25/21 for L TKA.  Pt with hx including diverticulosis, GERD, knee surgery 2021.    PT Comments    Patient  anxious seated on bed edge with RN supporting leg. Assisted to place foot to floor, KI applied. Patient ambulated x 25' x 2 with Rw, antalgic on left and bears minimal weight with improved weight as distance progressed.   Patient  less anxious with improved gait. Patient perform  TKA exercises with verbal cues.  Patient desires t o go home today, states that she has  additional help to stay with her. PT will see again at 2:00 and see how she  progresses and safety. Patient requires frequent cues for safety. Patient asking for IV meds and RN came in to give  IV meds again, even with patient plans to go home today.  Will need to practice 1 step and demonstrate safe ambulation next visit.  Recommendations for follow up therapy are one component of a multi-disciplinary discharge planning process, led by the attending physician.  Recommendations may be updated based on patient status, additional functional criteria and insurance authorization.  Follow Up Recommendations  Home health PT     Assistance Recommended at Discharge Frequent or constant Supervision/Assistance  Equipment Recommendations  Rolling walker (2 wheels);BSC/3in1    Recommendations for Other Services       Precautions / Restrictions Precautions Precautions: Fall;Knee Required Braces or Orthoses: Knee Immobilizer - Left Restrictions Weight Bearing Restrictions: Yes LLE Weight Bearing: Weight bearing as tolerated LLE Partial Weight Bearing Percentage or Pounds: 20     Mobility  Bed Mobility               General bed mobility comments: sitting on bed edge with RN supporting left leg. Placed KI while seated.     Transfers Overall transfer level: Needs assistance Equipment used: Rolling walker (2 wheels) Transfers: Sit to/from Stand Sit to Stand: Min assist           General transfer comment: cues for hand and Left leg position    Ambulation/Gait Ambulation/Gait assistance: Min assist Gait Distance (Feet): 25 Feet (x 2) Assistive device: Rolling walker (2 wheels) Gait Pattern/deviations: Step-to pattern;Antalgic Gait velocity: decr     General Gait Details: encouraged WB on Left leg, tending to  be TDWB, improved on return from BR.   Stairs             Wheelchair Mobility    Modified Rankin (Stroke Patients Only)       Balance Overall balance assessment: Needs assistance Sitting-balance support: No upper extremity supported Sitting balance-Leahy Scale: Good     Standing balance support: During functional activity;Single extremity supported Standing balance-Leahy Scale: Fair Standing balance comment: standing and  brushing teeth at sink                            Cognition Arousal/Alertness: Awake/alert Behavior During Therapy: Impulsive;Anxious Overall Cognitive Status: Within Functional Limits for tasks assessed                                          Exercises Total Joint Exercises Ankle Circles/Pumps: AROM;Both;10 reps;Seated Quad Sets: AROM;Left;10 reps;Supine Hip ABduction/ADduction: AAROM;Left;10 reps;Supine  Straight Leg Raises: AAROM;Left;10 reps;Supine Goniometric ROM: 10-45 knee flex on L    General Comments        Pertinent Vitals/Pain Pain Score: 8  Pain Location: L knee Pain Descriptors / Indicators: Sharp;Shooting;Aching Pain Intervention(s): Monitored during session;Premedicated before session;Patient requesting pain meds-RN notified;Ice applied    Home Living                          Prior Function            PT Goals (current goals can now be found in the care plan section) Progress  towards PT goals: Progressing toward goals    Frequency    7X/week      PT Plan Current plan remains appropriate    Co-evaluation              AM-PAC PT "6 Clicks" Mobility   Outcome Measure  Help needed turning from your back to your side while in a flat bed without using bedrails?: A Lot Help needed moving from lying on your back to sitting on the side of a flat bed without using bedrails?: A Lot Help needed moving to and from a bed to a chair (including a wheelchair)?: A Lot Help needed standing up from a chair using your arms (e.g., wheelchair or bedside chair)?: A Lot Help needed to walk in hospital room?: A Lot Help needed climbing 3-5 steps with a railing? : A Lot 6 Click Score: 12    End of Session Equipment Utilized During Treatment: Gait belt;Left knee immobilizer Activity Tolerance: Patient tolerated treatment well Patient left: in chair;with call bell/phone within reach Nurse Communication: Mobility status PT Visit Diagnosis: Other abnormalities of gait and mobility (R26.89);Muscle weakness (generalized) (M62.81);Pain Pain - Right/Left: Left Pain - part of body: Knee     Time: 1497-0263 PT Time Calculation (min) (ACUTE ONLY): 50 min  Charges:  $Gait Training: 8-22 mins $Therapeutic Exercise: 8-22 mins $Self Care/Home Management: [place] Pager [phone] Office [phone]    Claretha Cooper 08/26/2021, 11:54 AM

## 2021-08-26 NOTE — Progress Notes (Signed)
Physical Therapy Treatment Patient Details Name: Desiree Snow MRN: [id number] DOB: September 15, 1951 Today's Date: 08/26/2021   History of Present Illness Pt is 70 yo female admitted on 08/25/21 for L TKA.  Pt with hx including diverticulosis, GERD, knee surgery 2021.    PT Comments    Patient adamant about Dc today. Patient ambulated x 60' with Rw and performed 1 SeTP up using Rw. Patient reports that she prefers home. Has friends and neighbors PRN to assist.Patient asked about  Problem solving about feeding 2 cats indoors. Suggested placing a kitchen chair beside place cats eat so she can sit down , then place food to floor. Or ask neighbors to assist. Patient has met  PT goals.  Encouraged patient to increased weight on Left foot so as not to "hop", increases safety and decreased energy expenditure.    Recommendations for follow up therapy are one component of a multi-disciplinary discharge planning process, led by the attending physician.  Recommendations may be updated based on patient status, additional functional criteria and insurance authorization.  Follow Up Recommendations  Home health PT     Assistance Recommended at Discharge Frequent or constant Supervision/Assistance  Equipment Recommendations  Rolling walker (2 wheels);BSC/3in1    Recommendations for Other Services       Precautions / Restrictions Precautions Precautions: Fall;Knee Required Braces or Orthoses: Knee Immobilizer - Left Restrictions LLE Weight Bearing: Weight bearing as tolerated     Mobility  Bed Mobility               General bed mobility comments: in recliner    Transfers Overall transfer level: Needs assistance Equipment used: Rolling walker (2 wheels) Transfers: Sit to/from Stand Sit to Stand: Supervision           General transfer comment: cues for hand and Left leg position    Ambulation/Gait Ambulation/Gait assistance: Supervision Gait Distance (Feet): 60  Feet Assistive device: Rolling walker (2 wheels) Gait Pattern/deviations: Step-to pattern;Step-through pattern Gait velocity: decr     General Gait Details: mostly TDWB, encouraged increased weight vs hopping and increased energy expenditure   Stairs Stairs: Yes Stairs assistance: Min assist Stair Management: No rails;Forwards Number of Stairs: 1 General stair comments: patient able to lean down on RW to step up 1 step.   Wheelchair Mobility    Modified Rankin (Stroke Patients Only)       Balance Overall balance assessment: Needs assistance Sitting-balance support: No upper extremity supported Sitting balance-Leahy Scale: Good     Standing balance support: During functional activity;Single extremity supported Standing balance-Leahy Scale: Fair Standing balance comment: standing and  brushing teeth at sink                            Cognition Arousal/Alertness: Awake/alert Behavior During Therapy: Impulsive;Anxious;WFL for tasks assessed/performed Overall Cognitive Status: Within Functional Limits for tasks assessed                                          Exercises Total Joint Exercises Reviewed exercises for home until HHPT    General Comments        Pertinent Vitals/Pain Pain Score: 8  Pain Location: L knee Pain Descriptors / Indicators: Sharp;Shooting;Aching Pain Intervention(s): Monitored during session;Premedicated before session    Home Living  Prior Function            PT Goals (current goals can now be found in the care plan section) Progress towards PT goals: Progressing toward goals    Frequency    7X/week      PT Plan Current plan remains appropriate    Co-evaluation              AM-PAC PT "6 Clicks" Mobility   Outcome Measure  Help needed turning from your back to your side while in a flat bed without using bedrails?: A Little Help needed moving from lying  on your back to sitting on the side of a flat bed without using bedrails?: A Little Help needed moving to and from a bed to a chair (including a wheelchair)?: A Little Help needed standing up from a chair using your arms (e.g., wheelchair or bedside chair)?: A Little Help needed to walk in hospital room?: A Little Help needed climbing 3-5 steps with a railing? : A Lot 6 Click Score: 17    End of Session Equipment Utilized During Treatment: Gait belt;Left knee immobilizer Activity Tolerance: Patient tolerated treatment well Patient left: in chair;with call bell/phone within reach Nurse Communication: Mobility status PT Visit Diagnosis: Other abnormalities of gait and mobility (R26.89);Muscle weakness (generalized) (M62.81);Pain Pain - Right/Left: Left Pain - part of body: Knee     Time: 8003-4917 PT Time Calculation (min) (ACUTE ONLY): 33 min  Charges:  $Gait Training: 8-22 mins $Therapeutic Exercise: 8-22 mins $Self Care/Home Management: [place] Pager [phone] Office [phone]    Claretha Cooper 08/26/2021, 2:41 PM

## 2021-08-28 ENCOUNTER — Encounter (HOSPITAL_COMMUNITY): Payer: Self-pay | Admitting: Orthopedic Surgery

## 2021-09-04 ENCOUNTER — Encounter: Payer: Self-pay | Admitting: Hematology

## 2021-09-04 ENCOUNTER — Ambulatory Visit
Admission: RE | Admit: 2021-09-04 | Discharge: 2021-09-04 | Disposition: A | Discharge status: Home / Self Care | Payer: Medicare Other | Source: Ambulatory Visit | Attending: Hematology | Admitting: Hematology

## 2021-09-04 ENCOUNTER — Other Ambulatory Visit: Payer: Self-pay

## 2021-09-04 DIAGNOSIS — N6091 Unspecified benign mammary dysplasia of right breast: Secondary | ICD-10-CM

## 2021-09-04 MED ORDER — GADOBUTROL 1 MMOL/ML IV SOLN
10.0000 mL | Freq: Once | INTRAVENOUS | Status: AC | PRN
  Administered 2021-09-04: 10 mL via INTRAVENOUS

## 2021-09-06 ENCOUNTER — Other Ambulatory Visit: Payer: Self-pay | Admitting: Hematology

## 2021-09-06 DIAGNOSIS — R9389 Abnormal findings on diagnostic imaging of other specified body structures: Secondary | ICD-10-CM

## 2021-09-08 ENCOUNTER — Encounter: Payer: Self-pay | Admitting: Hematology

## 2021-09-12 ENCOUNTER — Other Ambulatory Visit: Payer: Self-pay

## 2021-09-20 ENCOUNTER — Ambulatory Visit
Admission: RE | Admit: 2021-09-20 | Discharge: 2021-09-20 | Disposition: A | Discharge status: Home / Self Care | Payer: Medicare Other | Source: Ambulatory Visit | Attending: Hematology | Admitting: Hematology

## 2021-09-20 ENCOUNTER — Other Ambulatory Visit: Payer: Self-pay

## 2021-09-20 ENCOUNTER — Other Ambulatory Visit: Payer: Self-pay | Admitting: Hematology

## 2021-09-20 DIAGNOSIS — N6320 Unspecified lump in the left breast, unspecified quadrant: Secondary | ICD-10-CM

## 2021-09-20 DIAGNOSIS — R9389 Abnormal findings on diagnostic imaging of other specified body structures: Secondary | ICD-10-CM

## 2021-10-20 ENCOUNTER — Other Ambulatory Visit: Payer: Self-pay

## 2021-10-20 DIAGNOSIS — Z8049 Family history of malignant neoplasm of other genital organs: Secondary | ICD-10-CM

## 2021-10-20 DIAGNOSIS — N6091 Unspecified benign mammary dysplasia of right breast: Secondary | ICD-10-CM

## 2021-10-23 ENCOUNTER — Inpatient Hospital Stay: Payer: Medicare Other

## 2021-10-23 ENCOUNTER — Other Ambulatory Visit: Payer: Self-pay

## 2021-10-23 ENCOUNTER — Inpatient Hospital Stay: Payer: Medicare Other | Attending: Hematology | Admitting: Hematology

## 2021-10-23 ENCOUNTER — Encounter: Payer: Self-pay | Admitting: Hematology

## 2021-10-23 VITALS — BP 128/80 | HR 88 | Temp 97.8°F | Resp 20 | Ht 66.0 in | Wt 195.7 lb

## 2021-10-23 DIAGNOSIS — N6091 Unspecified benign mammary dysplasia of right breast: Secondary | ICD-10-CM

## 2021-10-23 DIAGNOSIS — Z79811 Long term (current) use of aromatase inhibitors: Secondary | ICD-10-CM | POA: Insufficient documentation

## 2021-10-23 DIAGNOSIS — E079 Disorder of thyroid, unspecified: Secondary | ICD-10-CM | POA: Diagnosis not present

## 2021-10-23 DIAGNOSIS — Z8049 Family history of malignant neoplasm of other genital organs: Secondary | ICD-10-CM

## 2021-10-23 DIAGNOSIS — M85851 Other specified disorders of bone density and structure, right thigh: Secondary | ICD-10-CM | POA: Insufficient documentation

## 2021-10-23 DIAGNOSIS — Z87898 Personal history of other specified conditions: Secondary | ICD-10-CM | POA: Insufficient documentation

## 2021-10-23 DIAGNOSIS — K219 Gastro-esophageal reflux disease without esophagitis: Secondary | ICD-10-CM | POA: Diagnosis not present

## 2021-10-23 LAB — CMP (CANCER CENTER ONLY)
ALT: 11 U/L (ref 0–44)
AST: 15 U/L (ref 15–41)
Albumin: 4.3 g/dL (ref 3.5–5.0)
Alkaline Phosphatase: 76 U/L (ref 38–126)
Anion gap: 9 (ref 5–15)
BUN: 21 mg/dL (ref 8–23)
CO2: 25 mmol/L (ref 22–32)
Calcium: 9.8 mg/dL (ref 8.9–10.3)
Chloride: 102 mmol/L (ref 98–111)
Creatinine: 0.98 mg/dL (ref 0.44–1.00)
GFR, Estimated: >60 mL/min (ref >60)
Glucose, Bld: 123 mg/dL — ABNORMAL HIGH (ref 70–99)
Potassium: 4.4 mmol/L (ref 3.5–5.1)
Sodium: 136 mmol/L (ref 135–145)
Total Bilirubin: 0.4 mg/dL (ref 0.3–1.2)
Total Protein: 7.5 g/dL (ref 6.5–8.1)

## 2021-10-23 LAB — CBC WITH DIFFERENTIAL (CANCER CENTER ONLY)
Abs Immature Granulocytes: 0.02 10*3/uL (ref 0.00–0.07)
Basophils Absolute: 0.1 10*3/uL (ref 0.0–0.1)
Basophils Relative: 1 %
Eosinophils Absolute: 0.3 10*3/uL (ref 0.0–0.5)
Eosinophils Relative: 3 %
HCT: 40.8 % (ref 36.0–46.0)
Hemoglobin: 13.5 g/dL (ref 12.0–15.0)
Immature Granulocytes: 0 %
Lymphocytes Relative: 22 %
Lymphs Abs: 2.4 10*3/uL (ref 0.7–4.0)
MCH: 31 pg (ref 26.0–34.0)
MCHC: 33.1 g/dL (ref 30.0–36.0)
MCV: 93.6 fL (ref 80.0–100.0)
Monocytes Absolute: 1 10*3/uL (ref 0.1–1.0)
Monocytes Relative: 9 %
Neutro Abs: 7.3 10*3/uL (ref 1.7–7.7)
Neutrophils Relative %: 65 %
Platelet Count: 383 10*3/uL (ref 150–400)
RBC: 4.36 MIL/uL (ref 3.87–5.11)
RDW: 12.7 % (ref 11.5–15.5)
WBC Count: 11.1 10*3/uL — ABNORMAL HIGH (ref 4.0–10.5)
nRBC: 0 % (ref 0.0–0.2)

## 2021-10-23 MED ORDER — RALOXIFENE HCL 60 MG PO TABS: 60.0000 mg | ORAL_TABLET | Freq: Every day | ORAL | 2 refills | Status: DC

## 2021-10-23 NOTE — Progress Notes (Signed)
Desiree Snow   [phone] [phone]   Clinic Follow up Note   Patient Care Team: [name], [name], MD as PCP - General (Internal Medicine) Truitt Merle, MD as Consulting Physician (Hematology) Erroll Luna, MD as Consulting Physician (General Surgery)  Date of Service:  10/23/2021  CHIEF COMPLAINT: f/u of high risk for breast cancer  CURRENT THERAPY:  Will switch exemestane to raloxifene, starting 10/23/21  ASSESSMENT & PLAN:  Desiree Snow is a 71 y.o. female with   1. 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 -She was started on prophylactic anastrozole in 10/2020. Switched to exemestane in 05/2021 due to joint pains. She discontinued this on her own on 06/28/21 due to "aging affects." -most recent imaging with breast MRI on 09/04/21 showed possible early fat necrosis. Second-look left [place] on 09/20/21 again showed probably-benign mass. Short-term f/u MM and [place] scheduled for 12/20/21. -she expressed her concern regarding increased aging appearance from antiestrogen therapy. I discussed raloxifene in preventing breast cancer. She is willing to try. -We will continue annual screening mammogram with annual screening breast MRI 6 months apart.  -f/u in 6 months   2. Osteopenia -Her 01/31/18 DEXA showed osteopenia with lowest T-score -1.2 in right femur neck.  -raloxifene can increase bone density.   3. Cancer screening: History of smoking, family history of lung and uterine cancer -She quit smoking in 2004. She was previously followed with screening chest CT, completed 15 years of follow up in 2019. -She expressed concern since a paternal cousin recently passed away from lung cancer. Her aunt had uterine cancer  -she requested referral to genetics and underwent counseling and testing on 05/04/21. Results were negative, with  VUS in [facility].   4. History of seizure, GERD, thyroid disease -Continue medication and follow-up with PCP     PLAN:  -she has stopped exemestane in September 2022 due to aging effect on her appearance, she is agreeable to start raloxifene, I called in today  -lab and f/u in 6 months   No problem-specific Assessment & Plan notes found for this encounter.   INTERVAL HISTORY:  Desiree Snow is here for a follow up of high risk for breast cancer. She was last seen by me on 04/20/21. She presents to the clinic alone. She expressed concern that antiestrogens are making her look aged/older. She is very bothersome to her, and she stopped the exemestane in 06/2021. "I want to age like I'm supposed to."   All other systems were reviewed with the patient and are negative.  MEDICAL HISTORY:  Past Medical History:  Diagnosis Date   Allergy    Arthritis    Chicken pox    Diverticulosis    Family history of uterine cancer 05/05/2021   GERD (gastroesophageal reflux disease)    Goiter    H/O febrile seizure    as infant, x 1, List of AEDs tried: Dilantin, Mysoline, Tegretol, Gabapentine, Depakote,  Keppra, and Lamictal,   Seizures ([place])    "resolved" last seizure in 2002    SURGICAL HISTORY: Past Surgical History:  Procedure Laterality Date   [place] Right 07/29/2020   COMPLEX SCLEROSING LESION WITH ATYPICAL DUCTAL   BREAST BIOPSY Right 01/01/2020   COMPLEX SCLEROSING LESION    BREAST EXCISIONAL BIOPSY Right 09/20/2020   complex sclerosing   BREAST EXCISIONAL BIOPSY Right  03/01/2020   [place] IDC, CSL also considered   BREAST EXCISIONAL BIOPSY Left    ? Date  ?lipoma   BREAST LUMPECTOMY Left 2009   benign   BREAST LUMPECTOMY WITH RADIOACTIVE SEED LOCALIZATION Right 03/01/2020   Procedure: RIGHT BREAST LUMPECTOMY WITH RADIOACTIVE SEED LOCALIZATION;  Surgeon: Erroll Luna, MD;  Location: [place];  Service: General;  Laterality: Right;    BREAST LUMPECTOMY WITH RADIOACTIVE SEED LOCALIZATION Right 09/20/2020   Procedure: RIGHT BREAST LUMPECTOMY WITH RADIOACTIVE SEED LOCALIZATION;  Surgeon: Erroll Luna, MD;  Location: [place];  Service: General;  Laterality: Right;   KNEE SURGERY  2021   [place]   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   MENISECTOMY Left    [place]   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   Procedure: TOTAL KNEE ARTHROPLASTY;  Surgeon: Netta Cedars, MD;  Location: [facility];  Service: Orthopedics;  Laterality: Left;   WISDOM TOOTH EXTRACTION      I have reviewed the social history and family history with the patient and they are unchanged from previous note.  ALLERGIES:  is allergic to cetirizine, metronidazole, quinolones, scopolamine, and penicillins.  MEDICATIONS:  Current Outpatient Medications  Medication Sig Dispense Refill   raloxifene (EVISTA) 60 MG tablet Take 1 tablet (60 mg total) by mouth daily. 30 tablet 2   Calcium Carbonate-Vit D-Min (CALCIUM 1200 PO) Take 1 tablet by mouth daily.     Cholecalciferol (VITAMIN D3) 1000 units CAPS Take 1,000 Units by mouth daily.     CINNAMON PO Take 100 mg by mouth in the morning and at bedtime.     clorazepate (TRANXENE) 3.75 MG tablet Take 3.75 mg by mouth daily as needed for anxiety.     exemestane (AROMASIN) 25 MG tablet Take 1 tablet (25 mg total) by mouth daily after breakfast. (Patient not taking: No sig reported) 30 tablet 1   famotidine (PEPCID) 20 MG tablet Take 20 mg by mouth daily.      Flaxseed, Linseed, (FLAX SEED OIL PO) Take 1 capsule by mouth daily.     fluticasone (FLONASE) 50 MCG/ACT nasal spray Place 1 spray into both nostrils 2 (two) times daily as needed for allergies or rhinitis. (Patient taking differently: Place 1 spray into both nostrils daily as needed for allergies or rhinitis.) 16 g 5   ibuprofen (ADVIL) 800 MG tablet Take 1  tablet (800 mg total) by mouth every 8 (eight) hours as needed. (Patient not taking: Reported on 08/15/2021) 30 tablet 0   lamoTRIgine (LAMICTAL) 100 MG tablet Take 1 tablet (100 mg total) by mouth 2 (two) times daily. 180 tablet 4   Magnesium 250 MG TABS Take 250 mg by mouth daily.     methocarbamol (ROBAXIN) 500 MG tablet Take 1 tablet (500 mg total) by mouth every 8 (eight) hours as needed for muscle spasms. 60 tablet 1   Multiple Vitamin (MULTIVITAMIN) tablet Take 1 tablet by mouth daily.     ondansetron (ZOFRAN) 4 MG tablet Take 1 tablet (4 mg total) by mouth every 8 (eight) hours as needed for nausea, vomiting or refractory nausea / vomiting. 30 tablet 1   oxyCODONE-acetaminophen (PERCOCET) 5-325 MG tablet Take 1 tablet by mouth every 4 (four) hours as needed for severe pain. 20 tablet 0   traMADol (ULTRAM) 50 MG tablet Take 50 mg by mouth every 6 (six) hours as needed for pain.     trolamine  salicylate (ASPERCREME) 10 % cream Apply 1 application topically as needed for muscle pain.     TURMERIC PO Take 1 tablet by mouth daily.     UNABLE TO FIND Take 1 capsule by mouth daily. Med Name: Collagen 2 otc once daily     Zinc 50 MG CAPS Take 50 mg by mouth daily.     No current facility-administered medications for this visit.    PHYSICAL EXAMINATION: ECOG PERFORMANCE STATUS: 0 - Asymptomatic  Vitals:   10/23/21 1327  BP: 128/80  Pulse: 88  Resp: 20  Temp: 97.8 F (36.6 C)  SpO2: 99%   Wt Readings from Last 3 Encounters:  10/23/21 195 lb 11.2 oz (88.8 kg)  08/25/21 198 lb (89.8 kg)  08/18/21 198 lb (89.8 kg)     GENERAL:alert, no distress and comfortable SKIN: skin color, texture, turgor are normal, no rashes or significant lesions EYES: normal, Conjunctiva are pink and non-injected, sclera clear  NECK: supple, thyroid normal size, non-tender, without nodularity LYMPH:  no palpable lymphadenopathy in the cervical, axillary  LUNGS: clear to auscultation and percussion with  normal breathing effort HEART: regular rate & rhythm and no murmurs and no lower extremity edema ABDOMEN:abdomen soft, non-tender and normal bowel sounds Musculoskeletal:no cyanosis of digits and no clubbing  NEURO: alert & oriented x 3 with fluent speech, no focal motor/sensory deficits BREAST: No palpable mass, nodules or adenopathy bilaterally. Breast exam benign.   LABORATORY DATA:  I have reviewed the data as listed CBC Latest Ref Rng & Units 10/23/2021 08/26/2021 08/18/2021  WBC 4.0 - 10.5 K/uL 11.1(H) 18.3(H) 11.6(H)  Hemoglobin 12.0 - 15.0 g/dL 13.5 10.9(L) 13.4  Hematocrit 36.0 - 46.0 % 40.8 33.4(L) 41.3  Platelets 150 - 400 K/uL 383 324 398     CMP Latest Ref Rng & Units 10/23/2021 08/26/2021 04/20/2021  Glucose 70 - 99 mg/dL 123(H) 149(H) 105(H)  BUN 8 - 23 mg/dL 21 12 14   Creatinine 0.44 - 1.00 mg/dL 0.98 0.82 1.00  Sodium 135 - 145 mmol/L 136 133(L) 138  Potassium 3.5 - 5.1 mmol/L 4.4 3.8 4.6  Chloride 98 - 111 mmol/L 102 101 104  CO2 22 - 32 mmol/L 25 25 27   Calcium 8.9 - 10.3 mg/dL 9.8 8.5(L) 9.6  Total Protein 6.5 - 8.1 g/dL 7.5 - 7.1  Total Bilirubin 0.3 - 1.2 mg/dL 0.4 - 0.4  Alkaline Phos 38 - 126 U/L 76 - 63  AST 15 - 41 U/L 15 - 12(L)  ALT 0 - 44 U/L 11 - 13      RADIOGRAPHIC STUDIES: I have personally reviewed the radiological images as listed and agreed with the findings in the report. No results found.    No orders of the defined types were placed in this encounter.  All questions were answered. The patient knows to call the clinic with any problems, questions or concerns. No barriers to learning was detected. The total time spent in the appointment was 30 minutes.     Truitt Merle, MD 10/23/2021   I, Wilburn Mylar, am acting as scribe for Truitt Merle, MD.   I have reviewed the above documentation for accuracy and completeness, and I agree with the above.

## 2021-10-25 ENCOUNTER — Encounter: Payer: Self-pay | Admitting: Endocrinology

## 2021-10-25 ENCOUNTER — Telehealth: Payer: Self-pay | Admitting: Hematology

## 2021-10-25 NOTE — Telephone Encounter (Signed)
Scheduled follow-up appointment per 1/16 los. Patient is aware.

## 2021-10-27 ENCOUNTER — Encounter: Payer: Self-pay | Admitting: Endocrinology

## 2021-10-27 ENCOUNTER — Other Ambulatory Visit: Payer: Self-pay

## 2021-10-27 ENCOUNTER — Ambulatory Visit: Payer: Medicare Other | Admitting: Endocrinology

## 2021-10-27 VITALS — BP 152/74 | HR 95 | Ht 66.0 in | Wt 196.0 lb

## 2021-10-27 DIAGNOSIS — E042 Nontoxic multinodular goiter: Secondary | ICD-10-CM

## 2021-10-27 DIAGNOSIS — R221 Localized swelling, mass and lump, neck: Secondary | ICD-10-CM | POA: Diagnosis not present

## 2021-10-27 LAB — TSH: TSH: 0.69 u[IU]/mL (ref 0.35–5.50)

## 2021-10-27 LAB — T4, FREE: Free T4: 0.76 ng/dL (ref 0.60–1.60)

## 2021-10-27 NOTE — Progress Notes (Signed)
Subjective:    Patient ID: Desiree Snow, female    DOB: [birth year], 71 y.o.   MRN: [id number]  HPI Pt returns for f/u of multinodular goiter: (dx'ed 2018, incidentally on a CT scan of the chest  in 2018; [place]: Nodule #4 in the inferior left thyroid lobe and Nodule #5 in the mid left thyroid lobe both meet criteria for biopsy.  Nodule #3 in the superior left thyroid lobe meets criteria for 1 year follow-up; bx in 2018 (LLP, labeled # 8 on [place]) showed BENIGN FOLLICULAR NODULE ([place]; f/u US in 2019 shows LMP nodule (labeled #7 is stable in size and continues to meet criteria for bx; 8 is stable and previously underwent biopsy); she is euthyroid off rx; [place] in 2022 was unchanged).  Pt reports weight loss and palpitations.  She also notices a nodule at the left submandibular area.   Past Medical History:  Diagnosis Date   Allergy    Arthritis    Chicken pox    Diverticulosis    Family history of uterine cancer 05/05/2021   GERD (gastroesophageal reflux disease)    Goiter    H/O febrile seizure    as infant, x 1, List of AEDs tried: Dilantin, Mysoline, Tegretol, Gabapentine, Depakote,  Keppra, and Lamictal,   Seizures ([place])    "resolved" last seizure in 2002    Past Surgical History:  Procedure Laterality Date   [place] Right 07/29/2020   COMPLEX SCLEROSING LESION WITH ATYPICAL DUCTAL   BREAST BIOPSY Right 01/01/2020   COMPLEX SCLEROSING LESION    BREAST EXCISIONAL BIOPSY Right 09/20/2020   complex sclerosing   BREAST EXCISIONAL BIOPSY Right 03/01/2020   [facility] IDC, CSL also considered   BREAST EXCISIONAL BIOPSY Left    ? Date  ?lipoma   BREAST LUMPECTOMY Left 2009   benign   BREAST LUMPECTOMY WITH RADIOACTIVE SEED LOCALIZATION Right 03/01/2020   Procedure: RIGHT BREAST LUMPECTOMY WITH RADIOACTIVE SEED LOCALIZATION;  Surgeon: Erroll Luna, MD;  Location: [place];  Service: General;  Laterality: Right;   BREAST  LUMPECTOMY WITH RADIOACTIVE SEED LOCALIZATION Right 09/20/2020   Procedure: RIGHT BREAST LUMPECTOMY WITH RADIOACTIVE SEED LOCALIZATION;  Surgeon: Erroll Luna, MD;  Location: [place];  Service: General;  Laterality: Right;   KNEE SURGERY  2021   [place]   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   MENISECTOMY Left    [place]   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   Procedure: TOTAL KNEE ARTHROPLASTY;  Surgeon: Netta Cedars, MD;  Location[facility];  Service: Orthopedics;  Laterality: Left;   WISDOM TOOTH EXTRACTION      Social History   Socioeconomic History   Marital status: Single    Spouse name: Not on file   Number of children: 0   Years of education: Not on file   Highest education level: Master's degree (e.g., MA, MS, MEng, MEd, MSW, MBA)  Occupational History   Occupation: NP     Comment: NA  Tobacco Use   Smoking status: Former    Packs/day: 2.00    Years: 29.00    Pack years: 58.00    Types: Cigarettes    Quit date: 10/08/2002    Years since quitting: 19.0   Smokeless tobacco: Never  Vaping Use   Vaping Use: Never used  Substance and Sexual Activity   Alcohol use: No  Drug use: No   Sexual activity: Not on file  Other Topics Concern   Not on file  Social History Narrative   Lives alone   Caffeine- 2 c coffee   Social Determinants of Health   Financial Resource Strain: Not on file  Food Insecurity: Not on file  Transportation Needs: Not on file  Physical Activity: Not on file  Stress: Not on file  Social Connections: Not on file  Intimate Partner Violence: Not on file    Current Outpatient Medications on File Prior to Visit  Medication Sig Dispense Refill   Calcium Carbonate-Vit D-Min (CALCIUM 1200 PO) Take 1 tablet by mouth daily.     Cholecalciferol (VITAMIN D3) 1000 units CAPS Take 1,000 Units by mouth daily.     CINNAMON PO Take 100 mg by mouth  in the morning and at bedtime.     clorazepate (TRANXENE) 3.75 MG tablet Take 3.75 mg by mouth daily as needed for anxiety.     famotidine (PEPCID) 20 MG tablet Take 20 mg by mouth daily.      Flaxseed, Linseed, (FLAX SEED OIL PO) Take 1 capsule by mouth daily.     fluticasone (FLONASE) 50 MCG/ACT nasal spray Place 1 spray into both nostrils 2 (two) times daily as needed for allergies or rhinitis. (Patient taking differently: Place 1 spray into both nostrils daily as needed for allergies or rhinitis.) 16 g 5   ibuprofen (ADVIL) 800 MG tablet Take 1 tablet (800 mg total) by mouth every 8 (eight) hours as needed. 30 tablet 0   lamoTRIgine (LAMICTAL) 100 MG tablet Take 1 tablet (100 mg total) by mouth 2 (two) times daily. 180 tablet 4   Magnesium 250 MG TABS Take 250 mg by mouth daily.     methocarbamol (ROBAXIN) 500 MG tablet Take 1 tablet (500 mg total) by mouth every 8 (eight) hours as needed for muscle spasms. 60 tablet 1   Multiple Vitamin (MULTIVITAMIN) tablet Take 1 tablet by mouth daily.     raloxifene (EVISTA) 60 MG tablet Take 1 tablet (60 mg total) by mouth daily. 30 tablet 2   TURMERIC PO Take 1 tablet by mouth daily.     UNABLE TO FIND Take 1 capsule by mouth daily. Med Name: Collagen 2 otc once daily     Zinc 50 MG CAPS Take 50 mg by mouth daily.     exemestane (AROMASIN) 25 MG tablet Take 1 tablet (25 mg total) by mouth daily after breakfast. (Patient not taking: No sig reported) 30 tablet 1   ondansetron (ZOFRAN) 4 MG tablet Take 1 tablet (4 mg total) by mouth every 8 (eight) hours as needed for nausea, vomiting or refractory nausea / vomiting. 30 tablet 1   oxyCODONE-acetaminophen (PERCOCET) 5-325 MG tablet Take 1 tablet by mouth every 4 (four) hours as needed for severe pain. 20 tablet 0   traMADol (ULTRAM) 50 MG tablet Take 50 mg by mouth every 6 (six) hours as needed for pain.     trolamine salicylate (ASPERCREME) 10 % cream Apply 1 application topically as needed for muscle pain.      No current facility-administered medications on file prior to visit.    Allergies  Allergen Reactions   Cetirizine Other (See Comments)    Cognitive issues and "lowers seizure threshold."   Metronidazole Other (See Comments)    Strange feeling and inability to function   Quinolones    Scopolamine Hives     on her fingers no itchng  Penicillins Rash    Family History  Problem Relation Age of Onset   Pneumonia Mother    Dementia Father    Uterine cancer Maternal Aunt        dx before 36   Hypertension Maternal Grandmother    Stroke Maternal Grandmother    Dementia Paternal Grandfather    Lung cancer Cousin        paternal female cousin; dx after 25   Goiter Neg Hx    Thyroid cancer Neg Hx    Thyroid nodules Neg Hx    Thyroid disease Neg Hx     BP (!) 152/74    Pulse 95    Ht 5\' 6"  (1.676 m)    Wt 196 lb (88.9 kg)    SpO2 97%    BMI 31.64 kg/m   Review of Systems Denies fever.      Objective:   Physical Exam VITAL SIGNS:  See vs page.   GENERAL: no distress.   NECK: 2 cm nodule at the left submandibular area.         Assessment & Plan:  Neck nodule, uncertain etiology and prognosis Weight loss: check TFT  Patient Instructions  Let's check the ultrasound of the neck.  you will receive a phone call, about a day and time for an appointment.   Thyroid blood tests are requested for you today.  We'll let you know about the results.   I'll see you next time.

## 2021-10-27 NOTE — Patient Instructions (Addendum)
Let's check the ultrasound of the neck.  you will receive a phone call, about a day and time for an appointment.   Thyroid blood tests are requested for you today.  We'll let you know about the results.   I'll see you next time.

## 2021-11-02 ENCOUNTER — Ambulatory Visit
Admission: RE | Admit: 2021-11-02 | Discharge: 2021-11-02 | Disposition: A | Discharge status: Home / Self Care | Payer: Medicare Other | Source: Ambulatory Visit | Attending: Endocrinology | Admitting: Endocrinology

## 2021-11-02 DIAGNOSIS — R221 Localized swelling, mass and lump, neck: Secondary | ICD-10-CM

## 2021-11-21 ENCOUNTER — Encounter (HOSPITAL_COMMUNITY): Payer: Self-pay

## 2021-12-10 ENCOUNTER — Encounter: Payer: Self-pay | Admitting: Hematology

## 2021-12-20 ENCOUNTER — Ambulatory Visit
Admission: RE | Admit: 2021-12-20 | Discharge: 2021-12-20 | Disposition: A | Discharge status: Home / Self Care | Payer: Medicare Other | Source: Ambulatory Visit | Attending: Hematology | Admitting: Hematology

## 2021-12-20 DIAGNOSIS — N6320 Unspecified lump in the left breast, unspecified quadrant: Secondary | ICD-10-CM

## 2021-12-26 ENCOUNTER — Other Ambulatory Visit: Payer: Self-pay | Admitting: Hematology

## 2022-01-08 ENCOUNTER — Ambulatory Visit: Payer: Medicare Other | Admitting: Endocrinology

## 2022-01-08 ENCOUNTER — Encounter: Payer: Self-pay | Admitting: Endocrinology

## 2022-01-08 VITALS — BP 140/82 | HR 94 | Ht 66.0 in | Wt 198.6 lb

## 2022-01-08 DIAGNOSIS — E042 Nontoxic multinodular goiter: Secondary | ICD-10-CM | POA: Diagnosis not present

## 2022-01-08 NOTE — Patient Instructions (Addendum)
Let's recheck the ultrasound, later this year.  you will receive a phone call, about a day and time for an appointment. ?Please see a specialist in approx 1 year.   ?

## 2022-01-08 NOTE — Progress Notes (Signed)
? ?Subjective:  ? ? Patient ID: Desiree Snow, female    DOB: 08/28/1951, 71 y.o.   MRN: [id number] ? ?HPI ?Pt returns for f/u of multinodular goiter: (dx'ed 2018, incidentally on a CT scan of the chest  in 2018; [place]: Nodule #4 in the inferior left thyroid lobe and Nodule #5 in the mid left thyroid lobe both meet criteria for biopsy.  Nodule #3 in the superior left thyroid lobe meets criteria for 1 year follow-up; bx in 2018 (LLP, labeled # 8 on [place]) showed BENIGN FOLLICULAR NODULE ([place] II; f/u US in 2019 shows LMP nodule (labeled #7 is stable in size and continues to meet criteria for bx; 8 is stable and previously underwent biopsy); she is euthyroid off rx; [place] in 2022 was unchanged).  Pt reports weight loss and palpitations.   ? ? ?Past Surgical History:  ?Procedure Laterality Date  ? ADENOIDECTOMY  1963  ? BREAST BIOPSY Right 07/29/2020  ? COMPLEX SCLEROSING LESION WITH ATYPICAL DUCTAL  ? BREAST BIOPSY Right 01/01/2020  ? COMPLEX SCLEROSING LESION   ? BREAST EXCISIONAL BIOPSY Right 09/20/2020  ? complex sclerosing  ? BREAST EXCISIONAL BIOPSY Right 03/01/2020  ? Favor IDC, CSL also considered  ? BREAST EXCISIONAL BIOPSY Left   ? ? Date  ?lipoma  ? BREAST LUMPECTOMY Left 2009  ? benign  ? BREAST LUMPECTOMY WITH RADIOACTIVE SEED LOCALIZATION Right 03/01/2020  ? Procedure: RIGHT BREAST LUMPECTOMY WITH RADIOACTIVE SEED LOCALIZATION;  Surgeon: Erroll Luna, MD;  Location: [facility];  Service: General;  Laterality: Right;  ? BREAST LUMPECTOMY WITH RADIOACTIVE SEED LOCALIZATION Right 09/20/2020  ? Procedure: RIGHT BREAST LUMPECTOMY WITH RADIOACTIVE SEED LOCALIZATION;  Surgeon: Erroll Luna, MD;  Location: [facility];  Service: General;  Laterality: Right;  ? KNEE SURGERY  2021  ? LOBECTOMY FOR SEIZURE FOCUS  1998  ? 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  ? MENISECTOMY Left   ? SPLENECTOMY  1961  ? TONSILLECTOMY  1963  ? TOTAL KNEE ARTHROPLASTY  Left 08/25/2021  ? Procedure: TOTAL KNEE ARTHROPLASTY;  Surgeon: Netta Cedars, MD;  Location: [facility];  Service: Orthopedics;  Laterality: Left;  ? WISDOM TOOTH EXTRACTION    ? ? ?Social History  ? ?Socioeconomic History  ? Marital status: Single  ?  Spouse name: Not on file  ? Number of children: 0  ? Years of education: Not on file  ? Highest education level: Master's degree (e.g., MA, MS, MEng, MEd, MSW, MBA)  ?Occupational History  ? Occupation: NP   ?  Comment: NA  ?Tobacco Use  ? Smoking status: Former  ?  Packs/day: 2.00  ?  Years: 29.00  ?  Pack years: 58.00  ?  Types: Cigarettes  ?  Quit date: 10/08/2002  ?  Years since quitting: 19.2  ? Smokeless tobacco: Never  ?Vaping Use  ? Vaping Use: Never used  ?Substance and Sexual Activity  ? Alcohol use: No  ? Drug use: No  ? Sexual activity: Not on file  ?Other Topics Concern  ? Not on file  ?Social History Narrative  ? Lives alone  ? Caffeine- 2 c coffee  ? ?Social Determinants of Health  ? ?Financial Resource Strain: Not on file  ?Food Insecurity: Not on file  ?Transportation Needs: Not on file  ?Physical Activity: Not on file  ?Stress: Not on file  ?Social Connections: Not on file  ?Intimate Partner Violence: Not on file  ? ? ?Current Outpatient Medications on File  Prior to Visit  ?Medication Sig Dispense Refill  ? Calcium Carbonate-Vit D-Min (CALCIUM 1200 PO) Take 1 tablet by mouth daily.    ? Cholecalciferol (VITAMIN D3) 1000 units CAPS Take 1,000 Units by mouth daily.    ? CINNAMON PO Take 100 mg by mouth in the morning and at bedtime.    ? clorazepate (TRANXENE) 3.75 MG tablet Take 3.75 mg by mouth daily as needed for anxiety.    ? famotidine (PEPCID) 20 MG tablet Take 20 mg by mouth daily.     ? Flaxseed, Linseed, (FLAX SEED OIL PO) Take 1 capsule by mouth daily.    ? fluticasone (FLONASE) 50 MCG/ACT nasal spray Place 1 spray into both nostrils 2 (two) times daily as needed for allergies or rhinitis. (Patient taking differently: Place 1 spray into both  nostrils daily as needed for allergies or rhinitis.) 16 g 5  ? ibuprofen (ADVIL) 800 MG tablet Take 1 tablet (800 mg total) by mouth every 8 (eight) hours as needed. 30 tablet 0  ? lamoTRIgine (LAMICTAL) 100 MG tablet Take 1 tablet (100 mg total) by mouth 2 (two) times daily. 180 tablet 4  ? Magnesium 250 MG TABS Take 250 mg by mouth daily.    ? Multiple Vitamin (MULTIVITAMIN) tablet Take 1 tablet by mouth daily.    ? raloxifene (EVISTA) 60 MG tablet TAKE 1 TABLET BY MOUTH EVERY DAY 90 tablet 0  ? TURMERIC PO Take 1 tablet by mouth daily.    ? UNABLE TO FIND Take 1 capsule by mouth daily. Med Name: Collagen 2 otc once daily    ? Zinc 50 MG CAPS Take 50 mg by mouth daily.    ? ?No current facility-administered medications on file prior to visit.  ? ? ?Allergies  ?Allergen Reactions  ? Cetirizine Other (See Comments)  ?  Cognitive issues and "lowers seizure threshold."  ? Metronidazole Other (See Comments)  ?  Strange feeling and inability to function  ? Quinolones   ? Scopolamine Hives  ?   on her fingers no itchng  ? Penicillins Rash  ? ? ?Family History  ?Problem Relation Age of Onset  ? Pneumonia Mother   ? Dementia Father   ? Uterine cancer Maternal Aunt   ?     dx before 50  ? Hypertension Maternal Grandmother   ? Stroke Maternal Grandmother   ? Dementia Paternal Grandfather   ? Lung cancer Cousin   ?     paternal female cousin; dx after 11  ? Goiter Neg Hx   ? Thyroid cancer Neg Hx   ? Thyroid nodules Neg Hx   ? Thyroid disease Neg Hx   ? ? ?BP 140/82 (BP Location: Left Arm, Patient Position: Sitting, Cuff Size: Normal)   Pulse 94   Ht '5\' 6"'$  (1.676 m)   Wt 198 lb 9.6 oz (90.1 kg)   SpO2 96%   BMI 32.05 kg/m?  ? ? ?Review of Systems ? ?   ?Objective:  ? Physical Exam ?VITAL SIGNS:  See vs page ?GENERAL: no distress ?NECK: There is no palpable thyroid enlargement.  No thyroid nodule is palpable.  No palpable lymphadenopathy at the anterior neck.   ? ? ?Lab Results  ?Component Value Date  ? TSH 0.69  10/27/2021  ? ?   ?Assessment & Plan:  ?MNG, due for recheck.  Euthyroid, so she needs no rx ? ?Patient Instructions  ?Let's recheck the ultrasound, later this year.  you will receive a phone  call, about a day and time for an appointment. ?Please see a specialist in approx 1 year.   ? ? ?

## 2022-02-05 ENCOUNTER — Encounter: Payer: Self-pay | Admitting: Diagnostic Neuroimaging

## 2022-02-05 ENCOUNTER — Ambulatory Visit: Payer: Medicare Other | Admitting: Diagnostic Neuroimaging

## 2022-02-05 VITALS — BP 135/71 | HR 82 | Ht 66.0 in | Wt 202.2 lb

## 2022-02-05 DIAGNOSIS — G40909 Epilepsy, unspecified, not intractable, without status epilepticus: Secondary | ICD-10-CM

## 2022-02-05 MED ORDER — LAMOTRIGINE 100 MG PO TABS: 100.0000 mg | ORAL_TABLET | Freq: Two times a day (BID) | ORAL | 4 refills | Status: DC

## 2022-02-05 NOTE — Progress Notes (Signed)
? ?[facility] ? ?PATIENT: Desiree Snow ?DOB: 09-Dec-1950 ? ?REFERRING CLINICIAN: [name], [name], MD ?HISTORY FROM: patient  ?REASON FOR VISIT: follow up ? ? ?HISTORICAL ? ?CHIEF COMPLAINT:  ?Chief Complaint  ?Patient presents with  ? Seizures  ?  Rm 7, one year FU  "no seizure activity"  ? ? ?HISTORY OF PRESENT ILLNESS:  ? ?UPDATE (02/05/22, VRP): Since last visit, doing well. Symptoms are stable. Lamotrigine stable. Had knee surgery Nov 2022 and doing well.  ? ?PRIOR HPI (02/01/21): 71 year old female here for evaluation of seizure disorder. ? ?Patient had onset of seizures in 1963, diagnosed in 1965.  At that time patient was living in [place].  She was having weird sensations initially and then had complex partial seizures where she lost awareness.  She would still continue to be able to walk and move but would have memory lapse.  She was started on Dilantin, primidone, Tegretol without relief.  Patient continued to have seizures at least >25 times per month.  Eventually she had video EEG monitoring and underwent epilepsy surgery in 1998 at [facility] [place].  Since that time patient has done extremely well and has not had any major seizures.  She rarely has brief ROS but does not have alteration of consciousness.  Patient was maintained on Keppra and Lamictal for a while after her seizure surgery, and then reduce to Keppra alone.  She noticed some issues with mood and therefore transitioned from [place] to [place] in 2021.  Since that time patient is doing well.  Patient previously was seeing neurologist in [place] but requested transfer to care.  [place] where she lives. ? 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  She is currently not working but is looking for employment. ? ? ?REVIEW OF SYSTEMS: Full 14 system review of systems  performed and negative with exception of: As per HPI. ? ?ALLERGIES: ?Allergies  ?Allergen Reactions  ? Cetirizine Other (See Comments)  ?  Cognitive issues and "lowers seizure threshold."  ? Metronidazole Other (See Comments)  ?  Strange feeling and inability to function  ? Quinolones   ? Scopolamine Hives  ?   on her fingers no itchng  ? Penicillins Rash  ? ? ?HOME MEDICATIONS: ?Outpatient Medications Prior to Visit  ?Medication Sig Dispense Refill  ? Calcium Carbonate-Vit D-Min (CALCIUM 1200 PO) Take 1 tablet by mouth daily.    ? Cholecalciferol (VITAMIN D3) 1000 units CAPS Take 1,000 Units by mouth daily.    ? CINNAMON PO Take 100 mg by mouth in the morning and at bedtime.    ? clorazepate (TRANXENE) 3.75 MG tablet Take 3.75 mg by mouth daily as needed for anxiety.    ? famotidine (PEPCID) 20 MG tablet Take 20 mg by mouth daily.     ? Flaxseed, Linseed, (FLAX SEED OIL PO) Take 1 capsule by mouth daily.    ? fluticasone (FLONASE) 50 MCG/ACT nasal spray Place 1 spray into both nostrils 2 (two) times daily as needed for allergies or rhinitis. (Patient taking differently: Place 1 spray into both nostrils daily as needed for allergies or rhinitis.) 16 g 5  ? ibuprofen (ADVIL) 800 MG tablet Take 1 tablet (800 mg total) by mouth every 8 (eight) hours as needed. 30 tablet 0  ? Magnesium 250 MG TABS Take 250 mg by mouth daily.    ? Multiple Vitamin (  MULTIVITAMIN) tablet Take 1 tablet by mouth daily.    ? raloxifene (EVISTA) 60 MG tablet TAKE 1 TABLET BY MOUTH EVERY DAY 90 tablet 0  ? TURMERIC PO Take 1 tablet by mouth daily.    ? UNABLE TO FIND Take 1 capsule by mouth daily. Med Name: Collagen 2 otc once daily    ? Zinc 50 MG CAPS Take 50 mg by mouth daily.    ? lamoTRIgine (LAMICTAL) 100 MG tablet Take 1 tablet (100 mg total) by mouth 2 (two) times daily. 180 tablet 4  ? ?No facility-administered medications prior to visit.  ? ? ?PAST MEDICAL HISTORY: ?Past Medical History:  ?Diagnosis Date  ? Allergy   ? Arthritis   ?  Chicken pox   ? Diverticulosis   ? Family history of uterine cancer 05/05/2021  ? GERD (gastroesophageal reflux disease)   ? Goiter   ? H/O febrile seizure   ? as infant, x 1, List of AEDs tried: Dilantin, Mysoline, Tegretol, Gabapentine, Depakote,  Keppra, and Lamictal,  ? Seizures ([place])   ? "resolved" last seizure in 2002  ? ? ?PAST SURGICAL HISTORY: ?Past Surgical History:  ?Procedure Laterality Date  ? ADENOIDECTOMY  1963  ? BREAST BIOPSY Right 07/29/2020  ? COMPLEX SCLEROSING LESION WITH ATYPICAL DUCTAL  ? BREAST BIOPSY Right 01/01/2020  ? COMPLEX SCLEROSING LESION   ? BREAST EXCISIONAL BIOPSY Right 09/20/2020  ? complex sclerosing  ? BREAST EXCISIONAL BIOPSY Right 03/01/2020  ? Favor IDC, CSL also considered  ? BREAST EXCISIONAL BIOPSY Left   ? ? Date  ?lipoma  ? BREAST LUMPECTOMY Left 2009  ? benign  ? BREAST LUMPECTOMY WITH RADIOACTIVE SEED LOCALIZATION Right 03/01/2020  ? Procedure: RIGHT BREAST LUMPECTOMY WITH RADIOACTIVE SEED LOCALIZATION;  Surgeon: Erroll Luna, MD;  Location: [facility];  Service: General;  Laterality: Right;  ? BREAST LUMPECTOMY WITH RADIOACTIVE SEED LOCALIZATION Right 09/20/2020  ? Procedure: RIGHT BREAST LUMPECTOMY WITH RADIOACTIVE SEED LOCALIZATION;  Surgeon: Erroll Luna, MD;  Location: [facility];  Service: General;  Laterality: Right;  ? KNEE SURGERY  2021  ? LOBECTOMY FOR SEIZURE FOCUS  1998  ? 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  ? MENISECTOMY Left   ? SPLENECTOMY  1961  ? TONSILLECTOMY  1963  ? 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  ? Procedure: TOTAL KNEE ARTHROPLASTY;  Surgeon: Netta Cedars, MD;  [facility];  Service: Orthopedics;  Laterality: Left;  ? WISDOM TOOTH EXTRACTION    ? ? ?FAMILY HISTORY: ?Family History  ?Problem Relation Age of Onset  ? Pneumonia Mother   ? Dementia Father   ? Uterine cancer Maternal Aunt   ?     dx before 50  ? Hypertension Maternal Grandmother   ? Stroke Maternal  Grandmother   ? Dementia Paternal Grandfather   ? Lung cancer Cousin   ?     paternal female cousin; dx after 59  ? Goiter Neg Hx   ? Thyroid cancer Neg Hx   ? Thyroid nodules Neg Hx   ? Thyroid disease Neg Hx   ? ? ?SOCIAL HISTORY: ?Social History  ? ?Socioeconomic History  ? Marital status: Single  ?  Spouse name: Not on file  ? Number of children: 0  ? Years of education: Not on file  ? Highest education level: Master's degree (e.g., MA, MS, MEng, MEd, MSW, MBA)  ?Occupational History  ? Occupation: NP   ?  Comment: NA  ?Tobacco Use  ? Smoking  status: Former  ?  Packs/day: 2.00  ?  Years: 29.00  ?  Pack years: 58.00  ?  Types: Cigarettes  ?  Quit date: 10/08/2002  ?  Years since quitting: 19.3  ? Smokeless tobacco: Never  ?Vaping Use  ? Vaping Use: Never used  ?Substance and Sexual Activity  ? Alcohol use: No  ? Drug use: No  ? Sexual activity: Not on file  ?Other Topics Concern  ? Not on file  ?Social History Narrative  ? Lives alone  ? Caffeine- 2 c coffee  ? ?Social Determinants of Health  ? ?Financial Resource Strain: Not on file  ?Food Insecurity: Not on file  ?Transportation Needs: Not on file  ?Physical Activity: Not on file  ?Stress: Not on file  ?Social Connections: Not on file  ?Intimate Partner Violence: Not on file  ? ? ? ?PHYSICAL EXAM ? ?GENERAL EXAM/CONSTITUTIONAL: ?Vitals:  ?Vitals:  ? 02/05/22 1323  ?BP: 135/71  ?Pulse: 82  ?Weight: 202 lb 3.2 oz (91.7 kg)  ?Height: '5\' 6"'$  (1.676 m)  ? ?Body mass index is 32.64 kg/m?. ?Wt Readings from Last 3 Encounters:  ?02/05/22 202 lb 3.2 oz (91.7 kg)  ?01/08/22 198 lb 9.6 oz (90.1 kg)  ?10/27/21 196 lb (88.9 kg)  ? ?Patient is in no distress; well developed, nourished and groomed; neck is supple ? ?CARDIOVASCULAR: ?Examination of carotid arteries is normal; no carotid bruits ?Regular rate and rhythm, no murmurs ?Examination of peripheral vascular system by observation and palpation is normal ? ?EYES: ?Ophthalmoscopic exam of optic discs and posterior segments  is normal; no papilledema or hemorrhages ?No results found. ? ?MUSCULOSKELETAL: ?Gait, strength, tone, movements noted in Neurologic exam below ? ?NEUROLOGIC: ?MENTAL STATUS:  ?   ? View : No data to display.  ?  ?  ?

## 2022-02-23 ENCOUNTER — Other Ambulatory Visit: Payer: Self-pay | Admitting: Hematology

## 2022-02-23 DIAGNOSIS — N6323 Unspecified lump in the left breast, lower outer quadrant: Secondary | ICD-10-CM

## 2022-02-23 DIAGNOSIS — Z9189 Other specified personal risk factors, not elsewhere classified: Secondary | ICD-10-CM

## 2022-03-23 ENCOUNTER — Other Ambulatory Visit: Payer: Medicare Other

## 2022-04-03 ENCOUNTER — Encounter: Payer: Self-pay | Admitting: Hematology

## 2022-04-05 ENCOUNTER — Encounter: Payer: Self-pay | Admitting: Diagnostic Neuroimaging

## 2022-04-06 ENCOUNTER — Encounter: Payer: Self-pay | Admitting: Diagnostic Neuroimaging

## 2022-04-06 DIAGNOSIS — G459 Transient cerebral ischemic attack, unspecified: Secondary | ICD-10-CM

## 2022-04-06 DIAGNOSIS — G40909 Epilepsy, unspecified, not intractable, without status epilepticus: Secondary | ICD-10-CM

## 2022-04-09 NOTE — Telephone Encounter (Signed)
Dr Felecia Shelling, as work in do you mind reiterating and or agreeing with my advise to this pt? Per Chart, Oncology advised her the same thing I did, but she rather it be from a MD. Thank you!

## 2022-04-09 NOTE — Telephone Encounter (Signed)
Addressed in pt mychart 04/06/22

## 2022-04-12 ENCOUNTER — Telehealth: Payer: Self-pay | Admitting: Neurology

## 2022-04-12 NOTE — Telephone Encounter (Signed)
[employer] medicare NPR sent to GI 

## 2022-04-23 ENCOUNTER — Other Ambulatory Visit: Payer: Self-pay

## 2022-04-23 ENCOUNTER — Inpatient Hospital Stay: Payer: Medicare Other | Attending: Hematology | Admitting: Hematology

## 2022-04-23 ENCOUNTER — Inpatient Hospital Stay: Payer: Medicare Other

## 2022-04-23 ENCOUNTER — Encounter: Payer: Self-pay | Admitting: Hematology

## 2022-04-23 VITALS — BP 133/78 | HR 83 | Temp 97.6°F | Resp 18 | Ht 66.0 in | Wt 204.5 lb

## 2022-04-23 DIAGNOSIS — Z79811 Long term (current) use of aromatase inhibitors: Secondary | ICD-10-CM | POA: Insufficient documentation

## 2022-04-23 DIAGNOSIS — N6091 Unspecified benign mammary dysplasia of right breast: Secondary | ICD-10-CM | POA: Insufficient documentation

## 2022-04-23 DIAGNOSIS — M85851 Other specified disorders of bone density and structure, right thigh: Secondary | ICD-10-CM | POA: Diagnosis not present

## 2022-04-23 DIAGNOSIS — K219 Gastro-esophageal reflux disease without esophagitis: Secondary | ICD-10-CM | POA: Diagnosis not present

## 2022-04-23 DIAGNOSIS — E079 Disorder of thyroid, unspecified: Secondary | ICD-10-CM | POA: Insufficient documentation

## 2022-04-23 DIAGNOSIS — Z8049 Family history of malignant neoplasm of other genital organs: Secondary | ICD-10-CM | POA: Diagnosis not present

## 2022-04-23 LAB — CBC WITH DIFFERENTIAL (CANCER CENTER ONLY)
Abs Immature Granulocytes: 0.03 10*3/uL (ref 0.00–0.07)
Basophils Absolute: 0.1 10*3/uL (ref 0.0–0.1)
Basophils Relative: 1 %
Eosinophils Absolute: 1.3 10*3/uL — ABNORMAL HIGH (ref 0.0–0.5)
Eosinophils Relative: 10 %
HCT: 39.5 % (ref 36.0–46.0)
Hemoglobin: 13.2 g/dL (ref 12.0–15.0)
Immature Granulocytes: 0 %
Lymphocytes Relative: 22 %
Lymphs Abs: 2.9 10*3/uL (ref 0.7–4.0)
MCH: 31.1 pg (ref 26.0–34.0)
MCHC: 33.4 g/dL (ref 30.0–36.0)
MCV: 93.2 fL (ref 80.0–100.0)
Monocytes Absolute: 1.2 10*3/uL — ABNORMAL HIGH (ref 0.1–1.0)
Monocytes Relative: 9 %
Neutro Abs: 7.7 10*3/uL (ref 1.7–7.7)
Neutrophils Relative %: 58 %
Platelet Count: 333 10*3/uL (ref 150–400)
RBC: 4.24 MIL/uL (ref 3.87–5.11)
RDW: 12.9 % (ref 11.5–15.5)
WBC Count: 13.1 10*3/uL — ABNORMAL HIGH (ref 4.0–10.5)
nRBC: 0 % (ref 0.0–0.2)

## 2022-04-23 LAB — CMP (CANCER CENTER ONLY)
ALT: 12 U/L (ref 0–44)
AST: 15 U/L (ref 15–41)
Albumin: 4.1 g/dL (ref 3.5–5.0)
Alkaline Phosphatase: 70 U/L (ref 38–126)
Anion gap: 5 (ref 5–15)
BUN: 18 mg/dL (ref 8–23)
CO2: 29 mmol/L (ref 22–32)
Calcium: 9.3 mg/dL (ref 8.9–10.3)
Chloride: 105 mmol/L (ref 98–111)
Creatinine: 0.99 mg/dL (ref 0.44–1.00)
GFR, Estimated: >60 mL/min (ref >60)
Glucose, Bld: 116 mg/dL — ABNORMAL HIGH (ref 70–99)
Potassium: 4.2 mmol/L (ref 3.5–5.1)
Sodium: 139 mmol/L (ref 135–145)
Total Bilirubin: 0.4 mg/dL (ref 0.3–1.2)
Total Protein: 6.9 g/dL (ref 6.5–8.1)

## 2022-04-23 NOTE — Progress Notes (Signed)
[facility]   [phone] [phone]   Clinic Follow up Note   Patient Care Team: [name], [name], MD as PCP - General (Internal Medicine) Truitt Merle, MD as Consulting Physician (Hematology) Erroll Luna, MD as Consulting Physician (General Surgery)  Date of Service:  04/23/2022  CHIEF COMPLAINT: f/u of high risk for breast cancer  CURRENT THERAPY:  Surveillance  ASSESSMENT & PLAN:  Desiree Snow is a 71 y.o. post-menopausal female with   1. 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 -She was started on prophylactic anastrozole in 10/2020. Switched to exemestane in 05/2021 due to joint pains. She discontinued this on her own on 06/28/21 due to "aging affects." We switched her to raloxifene on 10/23/21, and we stopped in 04/05/22 due to possible mini-stroke. -most recent imaging with b/l MM and left [place] on 12/20/21 showed stable likely-benign fat necrosis in left breast. She is scheduled for breast MRI on 06/26/22. -she is clinically doing well. Labs reviewed, stable leukocytosis, otherwise WNL. Physical exam was unremarkable. There is no clinical concern for cancer. -We will continue annual screening mammogram with annual screening breast MRI 6 months apart.  -f/u in 12 months   2. Osteopenia -Her 01/31/18 DEXA showed osteopenia with lowest T-score -1.2 in right femur neck.  -raloxifene can increase bone density. She stopped due to poor tolerance    3. Cancer screening: History of smoking, family history of uterine cancer -She quit smoking in 2004. She was previously followed with screening chest CT, completed 15 years of follow up in 2019. -reports an aunt with uterine cancer. She requested referral to genetics and underwent counseling and testing on 05/04/21. Results were negative, with VUS in [facility].   4. History of  seizure, GERD, thyroid disease -Continue medication and follow-up with PCP -she had possible mini-stroke in late 03/2022. Given possible connection to raloxifene, she has discontinued. She is scheduled for head MRI's on 04/25/22.     PLAN:  -continue surveillance -breast MRI 06/26/22 -mammogram due 12/2022 -lab and f/u in 12 months   No problem-specific Assessment & Plan notes found for this encounter.   SUMMARY OF ONCOLOGIC HISTORY: Oncology History Overview Note  Cancer Staging No matching staging information was found for the patient.        INTERVAL HISTORY:  Desiree Snow is here for a follow up of high risk of breast cancer. She was last seen by me on 10/23/21. She presents to the clinic alone. She reports she is feeling much better off the raloxifene. She denies any new concerns.   All other systems were reviewed with the patient and are negative.  MEDICAL HISTORY:  Past Medical History:  Diagnosis Date   Allergy    Arthritis    Chicken pox    Diverticulosis    Family history of uterine cancer 05/05/2021   GERD (gastroesophageal reflux disease)    Goiter    H/O febrile seizure    as infant, x 1, List of AEDs tried: Dilantin, Mysoline, Tegretol, Gabapentine, Depakote,  Keppra, and Lamictal,   Seizures ([place])    "resolved" last seizure in 2002    SURGICAL HISTORY: Past Surgical History:  Procedure Laterality Date   [place] Right 07/29/2020   COMPLEX SCLEROSING LESION WITH ATYPICAL DUCTAL   BREAST BIOPSY Right 01/01/2020   COMPLEX SCLEROSING LESION  BREAST EXCISIONAL BIOPSY Right 09/20/2020   complex sclerosing   BREAST EXCISIONAL BIOPSY Right 03/01/2020   Favor IDC, CSL also considered   BREAST EXCISIONAL BIOPSY Left    ? Date  ?lipoma   BREAST LUMPECTOMY Left 2009   benign   BREAST LUMPECTOMY WITH RADIOACTIVE SEED LOCALIZATION Right 03/01/2020   Procedure: RIGHT BREAST LUMPECTOMY WITH RADIOACTIVE SEED LOCALIZATION;   Surgeon: Erroll Luna, MD;  Location: [facility];  Service: General;  Laterality: Right;   BREAST LUMPECTOMY WITH RADIOACTIVE SEED LOCALIZATION Right 09/20/2020   Procedure: RIGHT BREAST LUMPECTOMY WITH RADIOACTIVE SEED LOCALIZATION;  Surgeon: Erroll Luna, MD;  Location: [facility];  Service: General;  Laterality: Right;   KNEE SURGERY  2021   [place]   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   MENISECTOMY Left    [place]   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   Procedure: TOTAL KNEE ARTHROPLASTY;  Surgeon: Netta Cedars, MD;  [facility];  Service: Orthopedics;  Laterality: Left;   WISDOM TOOTH EXTRACTION      I have reviewed the social history and family history with the patient and they are unchanged from previous note.  ALLERGIES:  is allergic to cetirizine, metronidazole, quinolones, scopolamine, and penicillins.  MEDICATIONS:  Current Outpatient Medications  Medication Sig Dispense Refill   Calcium Carbonate-Vit D-Min (CALCIUM 1200 PO) Take 1 tablet by mouth daily.     Cholecalciferol (VITAMIN D3) 1000 units CAPS Take 1,000 Units by mouth daily.     CINNAMON PO Take 100 mg by mouth in the morning and at bedtime.     clorazepate (TRANXENE) 3.75 MG tablet Take 3.75 mg by mouth daily as needed for anxiety.     famotidine (PEPCID) 20 MG tablet Take 20 mg by mouth daily.      Flaxseed, Linseed, (FLAX SEED OIL PO) Take 1 capsule by mouth daily.     fluticasone (FLONASE) 50 MCG/ACT nasal spray Place 1 spray into both nostrils 2 (two) times daily as needed for allergies or rhinitis. (Patient taking differently: Place 1 spray into both nostrils daily as needed for allergies or rhinitis.) 16 g 5   ibuprofen (ADVIL) 800 MG tablet Take 1 tablet (800 mg total) by mouth every 8 (eight) hours as needed. 30 tablet 0   lamoTRIgine (LAMICTAL) 100 MG tablet Take 1 tablet (100  mg total) by mouth 2 (two) times daily. 180 tablet 4   Magnesium 250 MG TABS Take 250 mg by mouth daily.     Multiple Vitamin (MULTIVITAMIN) tablet Take 1 tablet by mouth daily.     raloxifene (EVISTA) 60 MG tablet TAKE 1 TABLET BY MOUTH EVERY DAY 90 tablet 0   TURMERIC PO Take 1 tablet by mouth daily.     UNABLE TO FIND Take 1 capsule by mouth daily. Med Name: Collagen 2 otc once daily     Zinc 50 MG CAPS Take 50 mg by mouth daily.     No current facility-administered medications for this visit.    PHYSICAL EXAMINATION: ECOG PERFORMANCE STATUS: 0 - Asymptomatic  Vitals:   04/23/22 1317  BP: 133/78  Pulse: 83  Resp: 18  Temp: 97.6 F (36.4 C)  SpO2: 100%   Wt Readings from Last 3 Encounters:  04/23/22 204 lb 8 oz (92.8 kg)  02/05/22 202 lb 3.2 oz (91.7 kg)  01/08/22 198 lb 9.6 oz (90.1 kg)     GENERAL:alert, no  distress and comfortable SKIN: skin color, texture, turgor are normal, no rashes or significant lesions EYES: normal, Conjunctiva are pink and non-injected, sclera clear  NECK: supple, thyroid normal size, non-tender, without nodularity LYMPH:  no palpable lymphadenopathy in the cervical, axillary LUNGS: clear to auscultation and percussion with normal breathing effort HEART: regular rate & rhythm and no murmurs and no lower extremity edema ABDOMEN:abdomen soft, non-tender and normal bowel sounds Musculoskeletal:no cyanosis of digits and no clubbing  NEURO: alert & oriented x 3 with fluent speech, no focal motor/sensory deficits BREAST: No palpable mass, nodules or adenopathy bilaterally. Breast exam benign.   LABORATORY DATA:  I have reviewed the data as listed    Latest Ref Rng & Units 04/23/2022   12:29 PM 10/23/2021    1:08 PM 08/26/2021    3:18 AM  CBC  WBC 4.0 - 10.5 K/uL 13.1  11.1  18.3   Hemoglobin 12.0 - 15.0 g/dL 13.2  13.5  10.9   Hematocrit 36.0 - 46.0 % 39.5  40.8  33.4   Platelets 150 - 400 K/uL 333  383  324         Latest Ref Rng & Units  04/23/2022   12:29 PM 10/23/2021    1:08 PM 08/26/2021    3:18 AM  CMP  Glucose 70 - 99 mg/dL 116  123  149   BUN 8 - 23 mg/dL '18  21  12   '$ Creatinine 0.44 - 1.00 mg/dL 0.99  0.98  0.82   Sodium 135 - 145 mmol/L 139  136  133   Potassium 3.5 - 5.1 mmol/L 4.2  4.4  3.8   Chloride 98 - 111 mmol/L 105  102  101   CO2 22 - 32 mmol/L '29  25  25   '$ Calcium 8.9 - 10.3 mg/dL 9.3  9.8  8.5   Total Protein 6.5 - 8.1 g/dL 6.9  7.5    Total Bilirubin 0.3 - 1.2 mg/dL 0.4  0.4    Alkaline Phos 38 - 126 U/L 70  76    AST 15 - 41 U/L 15  15    ALT 0 - 44 U/L 12  11        RADIOGRAPHIC STUDIES: I have personally reviewed the radiological images as listed and agreed with the findings in the report. No results found.    Orders Placed This Encounter  Procedures   MM DIAG BREAST TOMO BILATERAL    Standing Status:   Future    Standing Expiration Date:   04/24/2023    Order Specific Question:   Reason for Exam (SYMPTOM  OR DIAGNOSIS REQUIRED)    Answer:   screening    Order Specific Question:   Preferred imaging location?    Answer:   [facility]   All questions were answered. The patient knows to call the clinic with any problems, questions or concerns. No barriers to learning was detected. The total time spent in the appointment was 25 minutes.     Truitt Merle, MD 04/23/2022   I, Wilburn Mylar, am acting as scribe for Truitt Merle, MD.   I have reviewed the above documentation for accuracy and completeness, and I agree with the above.

## 2022-04-24 ENCOUNTER — Telehealth: Payer: Self-pay | Admitting: Hematology

## 2022-04-24 NOTE — Telephone Encounter (Signed)
Left message with follow-up appointment per 7/17 los.

## 2022-04-25 ENCOUNTER — Ambulatory Visit
Admission: RE | Admit: 2022-04-25 | Discharge: 2022-04-25 | Disposition: A | Discharge status: Home / Self Care | Payer: Medicare Other | Source: Ambulatory Visit | Attending: Neurology | Admitting: Neurology

## 2022-04-25 DIAGNOSIS — G459 Transient cerebral ischemic attack, unspecified: Secondary | ICD-10-CM

## 2022-04-25 MED ORDER — GADOBENATE DIMEGLUMINE 529 MG/ML IV SOLN
20.0000 mL | Freq: Once | INTRAVENOUS | Status: AC | PRN
  Administered 2022-04-25: 20 mL via INTRAVENOUS

## 2022-04-27 ENCOUNTER — Encounter: Payer: Self-pay | Admitting: Diagnostic Neuroimaging

## 2022-04-30 MED ORDER — CLORAZEPATE DIPOTASSIUM 3.75 MG PO TABS: 3.7500 mg | ORAL_TABLET | Freq: Every day | ORAL | 1 refills | Status: DC | PRN

## 2022-04-30 NOTE — Telephone Encounter (Signed)
Meds ordered this encounter  Medications   clorazepate (TRANXENE) 3.75 MG tablet    Sig: Take 1 tablet (3.75 mg total) by mouth daily as needed for anxiety.    Dispense:  15 tablet    Refill:  1    Penni Bombard, MD 9/79/4997, 1:82 PM Certified in Neurology, Neurophysiology and Neuroimaging  [facility] [address], [place], [place] [address] [phone]

## 2022-06-12 ENCOUNTER — Telehealth: Payer: Self-pay | Admitting: Internal Medicine

## 2022-06-12 NOTE — Telephone Encounter (Signed)
Her imaging is for a thyroid ultrasound.

## 2022-06-12 NOTE — Telephone Encounter (Signed)
Patient is calling saying that she has an appointment for imaging in October 2023 and she wants to know if she is supposed to keep the appointment since Dr. Loanne Drilling has left.

## 2022-06-13 NOTE — Telephone Encounter (Signed)
Attempted to contact the patient regarding her question about her thyroid ultrasound appt to inform her that its best to keep that appt and Dr. Cruzita Lederer can interpret any results since that is her new provider. LVM for a cb regarding any questions or concerns.

## 2022-06-14 NOTE — Telephone Encounter (Signed)
Patient returned call from 03/12/22. I provided her the information from the previous message.  Patient is OK to do Ultrasound, but is asking if the order for the Ultrasound needs to be re-done since Dr Loanne Drilling has retired and she will be seeing a different provider?

## 2022-06-14 NOTE — Telephone Encounter (Signed)
Patient informed that since she already has the appointment scheduled for her Thryoid [place] the order placed is still valid and shouldn't have any issues. Patient informed to contact us if she has any further issues regarding the thyroid order.

## 2022-06-26 ENCOUNTER — Ambulatory Visit
Admission: RE | Admit: 2022-06-26 | Discharge: 2022-06-26 | Disposition: A | Discharge status: Home / Self Care | Payer: Medicare Other | Source: Ambulatory Visit | Attending: Hematology | Admitting: Hematology

## 2022-06-26 DIAGNOSIS — Z9189 Other specified personal risk factors, not elsewhere classified: Secondary | ICD-10-CM

## 2022-06-26 DIAGNOSIS — N6323 Unspecified lump in the left breast, lower outer quadrant: Secondary | ICD-10-CM

## 2022-06-26 MED ORDER — GADOBUTROL 1 MMOL/ML IV SOLN
10.0000 mL | Freq: Once | INTRAVENOUS | Status: AC | PRN
  Administered 2022-06-26: 10 mL via INTRAVENOUS

## 2022-07-12 ENCOUNTER — Ambulatory Visit: Payer: Medicare Other | Admitting: Endocrinology

## 2022-07-13 ENCOUNTER — Other Ambulatory Visit: Payer: Self-pay | Admitting: Diagnostic Neuroimaging

## 2022-07-18 ENCOUNTER — Ambulatory Visit: Payer: Medicare Other | Admitting: Internal Medicine

## 2022-07-25 ENCOUNTER — Encounter: Payer: Self-pay | Admitting: Hematology

## 2022-08-06 ENCOUNTER — Encounter: Payer: Self-pay | Admitting: Internal Medicine

## 2022-08-06 ENCOUNTER — Ambulatory Visit
Admission: RE | Admit: 2022-08-06 | Discharge: 2022-08-06 | Disposition: A | Discharge status: Home / Self Care | Payer: Medicare Other | Source: Ambulatory Visit | Attending: Endocrinology | Admitting: Endocrinology

## 2022-08-06 DIAGNOSIS — E042 Nontoxic multinodular goiter: Secondary | ICD-10-CM

## 2022-08-20 ENCOUNTER — Ambulatory Visit: Payer: Medicare Other | Admitting: Internal Medicine

## 2022-09-11 ENCOUNTER — Encounter: Payer: Self-pay | Admitting: Hematology

## 2022-09-12 ENCOUNTER — Other Ambulatory Visit: Payer: Self-pay | Admitting: Hematology

## 2022-09-12 DIAGNOSIS — N6320 Unspecified lump in the left breast, unspecified quadrant: Secondary | ICD-10-CM

## 2022-09-12 DIAGNOSIS — R9389 Abnormal findings on diagnostic imaging of other specified body structures: Secondary | ICD-10-CM

## 2022-09-24 ENCOUNTER — Other Ambulatory Visit: Payer: Self-pay | Admitting: Hematology

## 2022-09-24 ENCOUNTER — Ambulatory Visit
Admission: RE | Admit: 2022-09-24 | Discharge: 2022-09-24 | Disposition: A | Discharge status: Home / Self Care | Payer: Medicare Other | Source: Ambulatory Visit | Attending: Hematology | Admitting: Hematology

## 2022-09-24 DIAGNOSIS — N6320 Unspecified lump in the left breast, unspecified quadrant: Secondary | ICD-10-CM

## 2022-09-25 ENCOUNTER — Ambulatory Visit: Payer: Medicare Other | Admitting: Internal Medicine

## 2022-09-25 ENCOUNTER — Encounter: Payer: Self-pay | Admitting: Internal Medicine

## 2022-09-25 ENCOUNTER — Encounter: Payer: Self-pay | Admitting: Hematology

## 2022-09-25 VITALS — BP 130/72 | HR 79 | Ht 66.0 in | Wt 207.8 lb

## 2022-09-25 DIAGNOSIS — Z77098 Contact with and (suspected) exposure to other hazardous, chiefly nonmedicinal, chemicals: Secondary | ICD-10-CM | POA: Diagnosis not present

## 2022-09-25 DIAGNOSIS — E042 Nontoxic multinodular goiter: Secondary | ICD-10-CM | POA: Diagnosis not present

## 2022-09-25 NOTE — Patient Instructions (Signed)
Please stop at the lab.  Use gloves when you handle Methimazole.  We will schedule a thyroid biopsy.  You should have an endocrinology follow-up appointment in 6 months.

## 2022-09-25 NOTE — Progress Notes (Unsigned)
Patient ID: Desiree Snow, female   DOB: Jun 06, 1951, 71 y.o.   MRN: [id number]  HPI  Desiree Snow is a 71 y.o.-year-old female, returning for follow-up for multinodular goiter.  She previously saw Dr. Loanne Drilling, last visit with him 8 months ago.  Since last OV with Dr. Loanne Drilling: Patient describes that she is singing and she noticed that she needs more effort for getting her voice out while singing. Also, she feels that her voice is more pitched. She feels a nodule when swallows.  She does have postnasal drip, but she feels the nodule in a different way.  No pain with swallowing.  She also tells me that her cat has hypothyroidism and she is putting methimazole gel on her years.  She is not using gloves handling this.  Past thyroid history: Patient was diagnosed with thyroid nodules in 04/2017 -these were incidentally seen on a CT scan of her neck.  Thyroid U/S (06/13/2017): She had 3 thyroid nodules in the left lobe, of which the inferior and nodules met the criteria for biopsy, while the sup nodule qualified for 1 year follow-up  Bx (06/25/2017) of the L inf nodule (2.1 cm): benign  Thyroid U/S (07/02/2018): Stable nodules  Thyroid U/S (07/27/2019): Stable nodules  Thyroid U/S (07/20/2020): Stable nodules, with the left mid thyroid nodule not meeting criteria for follow-up or biopsy anymore  Thyroid U/S (07/18/2021): Stable nodules  Thyroid U/S (08/06/2022: Stable nodules except for the left inferior thyroid nodule, which increased in size from 2.3 to 2.8 cm and now meets criteria for biopsy: Isthmus: 0.8 cm  Right lobe: 5.1 cm x 1.6 cm x 1.8 cm  Left lobe: 5.2 cm x 1.9 cm x 2.1 cm  ________________________________________________________   Estimated total number of nodules >/= 1 cm: 5 _________________________________________________________   Nodule labeled 1 in the isthmus, 1.1 cm, unchanged. Nodule remains TR 4 and has been stable now 5 years. Discontinuing  surveillance may be reasonable.   Nodule labeled 2, superior right thyroid, 1.2 cm. Nodule remains TR 3, smaller than prior, and does not meet criteria for surveillance.   Nodule labeled 3, mid right thyroid, 8 mm with spongiform characteristics and does not meet criteria for surveillance.   Nodule labeled 4, inferior right thyroid, 0.9 cm. Nodule remains unchanged and does not meet criteria for surveillance.   Nodule labeled 5, left superior thyroid, 1.8 cm, unchanged. Nodule remains TR 3 and appears stable for 5 years. Discontinuing surveillance may be reasonable.   Nodule labeled 6, mid left thyroid, 1.0 cm this nodule again demonstrates ill-defined borders, potentially a pseudo nodule.   Nodule labeled 7, inferior left thyroid, previously 2.3 cm, now 2.8 cm. Nodule remains TR 3 and now meets criteria for biopsy.    No adenopathy   IMPRESSION: Multinodular thyroid goiter again demonstrated, as above.   The only significant change would be the larger size of nodule labeled 7 inferior left thyroid (2.8 cm, TR 3) which now meets criteria for biopsy as designated by the newly established ACR TI-RADS criteria, and referral for biopsy is recommended.   The other previously documented nodules (labeled 1 and 5) have now demonstrated 5 years of stability and discontinuing surveillance may be reasonable.  I reviewed pt's thyroid tests: Lab Results  Component Value Date   TSH 0.69 10/27/2021   TSH 1.50 07/12/2021   TSH 1.46 07/12/2020   TSH 0.87 07/07/2019   TSH 1.27 12/25/2017   TSH 1.22 11/22/2016   TSH 1.29 11/24/2015  FREET4 0.76 10/27/2021   FREET4 0.84 07/12/2021   FREET4 0.74 07/12/2020    No FH of thyroid ds. No FH of thyroid cancer. No h/o radiation tx to head or neck. No recent contrast studies. No steroid use.  + A small amount of biotin supplement -in B complex.    Pt also has a history of GERD, distant history of seizure disorder, obesity class  I.  ROS: + See HPI   Past Medical History:  Diagnosis Date   Allergy    Arthritis    Chicken pox    Diverticulosis    Family history of uterine cancer 05/05/2021   GERD (gastroesophageal reflux disease)    Goiter    H/O febrile seizure    as infant, x 1, List of AEDs tried: Dilantin, Mysoline, Tegretol, Gabapentine, Depakote,  Keppra, and Lamictal,   Seizures ([place])    "resolved" last seizure in 2002   Past Surgical History:  Procedure Laterality Date   [place] Right 07/29/2020   COMPLEX SCLEROSING LESION WITH ATYPICAL DUCTAL   BREAST BIOPSY Right 01/01/2020   COMPLEX SCLEROSING LESION    BREAST EXCISIONAL BIOPSY Right 09/20/2020   complex sclerosing   BREAST EXCISIONAL BIOPSY Right 03/01/2020   [place] IDC, CSL also considered   BREAST EXCISIONAL BIOPSY Left    ? Date  ?lipoma   BREAST LUMPECTOMY Left 2009   benign   BREAST LUMPECTOMY WITH RADIOACTIVE SEED LOCALIZATION Right 03/01/2020   Procedure: RIGHT BREAST LUMPECTOMY WITH RADIOACTIVE SEED LOCALIZATION;  Surgeon: Erroll Luna, MD;  Location: [facility];  Service: General;  Laterality: Right;   BREAST LUMPECTOMY WITH RADIOACTIVE SEED LOCALIZATION Right 09/20/2020   Procedure: RIGHT BREAST LUMPECTOMY WITH RADIOACTIVE SEED LOCALIZATION;  Surgeon: Erroll Luna, MD;  Location: [facility];  Service: General;  Laterality: Right;   KNEE SURGERY  2021   [place]   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   MENISECTOMY Left    [place]   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   Procedure: TOTAL KNEE ARTHROPLASTY;  Surgeon: Netta Cedars, MD;  Location: [facility];  Service: Orthopedics;  Laterality: Left;   WISDOM TOOTH EXTRACTION     Social History   Socioeconomic History   Marital status: Single    Spouse name: Not on file   Number of children: 0   Years of education: Not on file    Highest education level: Master's degree (e.g., MA, MS, MEng, MEd, MSW, MBA)  Occupational History   Occupation: NP     Comment: NA  Tobacco Use   Smoking status: Former    Packs/day: 2.00    Years: 29.00    Total pack years: 58.00    Types: Cigarettes    Quit date: 10/08/2002    Years since quitting: 19.9   Smokeless tobacco: Never  Vaping Use   Vaping Use: Never used  Substance and Sexual Activity   Alcohol use: No   Drug use: No   Sexual activity: Not on file  Other Topics Concern   Not on file  Social History Narrative   Lives alone   Caffeine- 2 c coffee   Social Determinants of Health   Financial Resource Strain: Not on file  Food Insecurity: Not on file  Transportation Needs: Not on file  Physical Activity: Not on file  Stress: Not on file  Social Connections: Not on file  Intimate  Partner Violence: Not on file   Current Outpatient Medications on File Prior to Visit  Medication Sig Dispense Refill   Calcium Carbonate-Vit D-Min (CALCIUM 1200 PO) Take 1 tablet by mouth daily.     Cholecalciferol (VITAMIN D3) 1000 units CAPS Take 1,000 Units by mouth daily.     CINNAMON PO Take 100 mg by mouth in the morning and at bedtime.     clorazepate (TRANXENE) 3.75 MG tablet TAKE 1 TABLET BY MOUTH EVERY DAY AS NEEDED ANXIETY 15 tablet 1   famotidine (PEPCID) 20 MG tablet Take 20 mg by mouth daily.      Flaxseed, Linseed, (FLAX SEED OIL PO) Take 1 capsule by mouth daily.     fluticasone (FLONASE) 50 MCG/ACT nasal spray Place 1 spray into both nostrils 2 (two) times daily as needed for allergies or rhinitis. (Patient taking differently: Place 1 spray into both nostrils daily as needed for allergies or rhinitis.) 16 g 5   ibuprofen (ADVIL) 800 MG tablet Take 1 tablet (800 mg total) by mouth every 8 (eight) hours as needed. 30 tablet 0   lamoTRIgine (LAMICTAL) 100 MG tablet Take 1 tablet (100 mg total) by mouth 2 (two) times daily. 180 tablet 4   Magnesium 250 MG TABS Take 250 mg by  mouth daily.     Multiple Vitamin (MULTIVITAMIN) tablet Take 1 tablet by mouth daily.     raloxifene (EVISTA) 60 MG tablet TAKE 1 TABLET BY MOUTH EVERY DAY 90 tablet 0   TURMERIC PO Take 1 tablet by mouth daily.     UNABLE TO FIND Take 1 capsule by mouth daily. Med Name: Collagen 2 otc once daily     Zinc 50 MG CAPS Take 50 mg by mouth daily.     No current facility-administered medications on file prior to visit.   Allergies  Allergen Reactions   Cetirizine Other (See Comments)    Cognitive issues and "lowers seizure threshold."   Metronidazole Other (See Comments)    Strange feeling and inability to function   Quinolones    Scopolamine Hives     on her fingers no itchng   Penicillins Rash   Family History  Problem Relation Age of Onset   Pneumonia Mother    Dementia Father    Uterine cancer Maternal Aunt        dx before 37   Hypertension Maternal Grandmother    Stroke Maternal Grandmother    Dementia Paternal Grandfather    Lung cancer Cousin        paternal female cousin; dx after 3   Goiter Neg Hx    Thyroid cancer Neg Hx    Thyroid nodules Neg Hx    Thyroid disease Neg Hx     PE: BP 130/72 (BP Location: Right Arm, Patient Position: Sitting, Cuff Size: Normal)   Pulse 79   Ht _0  (1.676 m)   Wt 207 lb 12.8 oz (94.3 kg)   SpO2 99%   BMI 33.54 kg/m  Wt Readings from Last 3 Encounters:  09/25/22 207 lb 12.8 oz (94.3 kg)  04/23/22 204 lb 8 oz (92.8 kg)  02/05/22 202 lb 3.2 oz (91.7 kg)   Constitutional: overweight, in NAD Eyes:  EOMI, no exophthalmos ENT: no neck masses, no cervical lymphadenopathy Cardiovascular: RRR, No MRG Respiratory: CTA B Musculoskeletal: no deformities Skin:no rashes Neurological: no tremor with outstretched hands  ASSESSMENT: 1. 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 Thyroid nodules -Patient with history of  multiple thyroid nodules that have been followed with annual ultrasounds since 2018.  On the latest  thyroid ultrasound from last month, the nodules appear to be stable except for the left inferior thyroid nodule, which increased in size from 2.3 to 2.8 cm. -She does have neck compression symptoms: She feels the nodule whenever she swallows, she also feels that her voice has changed in tonality and has more problems singing. - I reviewed the images of her thyroid ultrasound along with the patient. I pointed out that the dominant nodule is large, and also hypoechoic, but otherwise, it does not have other risk factors for cancer including: - presence of microcalcifications - presence of internal blood flow - taller-than-wide distribution - irregular contours - Pt does not have a thyroid cancer family history or a personal history of RxTx to head/neck.  Also, the nodule was biopsied in the past with benign results.   Therefore, the risk of thyroid cancer is not very high, but not 0, either. -At today's visit we discussed about repeating a biopsy of this nodule.  She agrees with this plan. -However, due to her neck compression symptoms, if the biopsy is benign, we decided to get a barium swallow to see the extent of the thyroid compression on the esophagus -We also discussed that although it is conceivable that the largest nodule is causing her neck compression symptoms due to the proximity to the esophagus, some of the symptoms could be caused by the isthmic nodule.   -if the nodule is cancerous, we discussed that thyroid cancer is a slow-growing cancer with good prognosis, and her life expectancy or quality of life is unlikely to be affected by this.  However, it does require thyroidectomy and possibly the need of levothyroxine replacement afterwards. - we discussed about the possibility of the FNA sample to be in conclusive, in which case it will be sent for molecular analysis (Afirma). - if the biopsy is benign, I did advise her to think about it and decide if she needs thyroid surgery (either  lobectomy or thyroidectomy) - I did explain that, while thyroid surgery is not a complicated one, it still can have side effects (including affecting the voice especially in somebody who sings) and also she might have a risk of ~25% of becoming hypothyroid after hemithyroidectomy.  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  Previous labs have been normal in 10/2021, but she estimates that she has been giving her cat methimazole for approximately a year. -She is on biotin and B complex, but a low dose.  Last dose last night.  Orders Placed This Encounter  Procedures   [place] FNA BIOPSY THYROID EA ADD LESION AFIRMA   TSH   T4, free   T3, free   - Total time spent for the visit: 40 min, in precharting, postcharting, reviewing Dr. Cordelia Pen last note, obtaining medical information from the chart and from the pt, reviewing her  previous labs, imaging evaluations, and treatments, reviewing her symptoms, counseling her about her thyroid conditions (please see the discussed topics above), and developing a plan to further evaluate and treat them; she had a number of questions which I addressed.  Philemon Kingdom, MD PhD [facility] Endocrinology

## 2022-09-26 LAB — TSH: TSH: 0.87 u[IU]/mL (ref 0.35–5.50)

## 2022-09-26 LAB — T3, FREE: T3, Free: 3.5 pg/mL (ref 2.3–4.2)

## 2022-09-26 LAB — T4, FREE: Free T4: 0.81 ng/dL (ref 0.60–1.60)

## 2022-10-05 ENCOUNTER — Encounter: Payer: Self-pay | Admitting: Diagnostic Neuroimaging

## 2022-10-09 ENCOUNTER — Other Ambulatory Visit: Payer: Self-pay | Admitting: Neurology

## 2022-10-09 MED ORDER — CLORAZEPATE DIPOTASSIUM 3.75 MG PO TABS: ORAL_TABLET | ORAL | 1 refills | Status: DC

## 2022-10-11 ENCOUNTER — Other Ambulatory Visit (HOSPITAL_COMMUNITY)
Admission: RE | Admit: 2022-10-11 | Discharge: 2022-10-11 | Disposition: A | Discharge status: Home / Self Care | Payer: Medicare Other | Source: Ambulatory Visit | Attending: Internal Medicine | Admitting: Internal Medicine

## 2022-10-11 ENCOUNTER — Ambulatory Visit
Admission: RE | Admit: 2022-10-11 | Discharge: 2022-10-11 | Disposition: A | Discharge status: Home / Self Care | Payer: Medicare Other | Source: Ambulatory Visit | Attending: Internal Medicine | Admitting: Internal Medicine

## 2022-10-11 DIAGNOSIS — E042 Nontoxic multinodular goiter: Secondary | ICD-10-CM

## 2022-10-15 ENCOUNTER — Encounter: Payer: Self-pay | Admitting: Internal Medicine

## 2022-10-15 LAB — CYTOLOGY - NON PAP

## 2022-11-02 ENCOUNTER — Encounter: Payer: Self-pay | Admitting: Genetic Counselor

## 2022-11-12 ENCOUNTER — Telehealth: Payer: Self-pay | Admitting: Genetic Counselor

## 2022-11-12 NOTE — Telephone Encounter (Signed)
Patient had questions about her amended report.  Explained that her VUS was amended to likely benign.  This means that this is not a pathogenic/disease causing mutation.  Patient voiced her understanding.

## 2022-12-05 NOTE — Progress Notes (Signed)
UPDATE: APC [id number] VUS amended to Likely Benign.  The amended report date is October 26, 2022.

## 2022-12-24 ENCOUNTER — Ambulatory Visit
Admission: RE | Admit: 2022-12-24 | Discharge: 2022-12-24 | Disposition: A | Discharge status: Home / Self Care | Payer: Medicare Other | Source: Ambulatory Visit | Attending: Hematology | Admitting: Hematology

## 2022-12-24 DIAGNOSIS — N6320 Unspecified lump in the left breast, unspecified quadrant: Secondary | ICD-10-CM

## 2023-01-14 ENCOUNTER — Encounter: Payer: Self-pay | Admitting: Gastroenterology

## 2023-02-11 ENCOUNTER — Encounter: Payer: Self-pay | Admitting: Diagnostic Neuroimaging

## 2023-02-11 ENCOUNTER — Ambulatory Visit: Payer: Medicare Other | Admitting: Diagnostic Neuroimaging

## 2023-02-11 VITALS — BP 141/70 | HR 84 | Ht 66.0 in | Wt 207.4 lb

## 2023-02-11 DIAGNOSIS — G40909 Epilepsy, unspecified, not intractable, without status epilepticus: Secondary | ICD-10-CM | POA: Diagnosis not present

## 2023-02-11 DIAGNOSIS — G459 Transient cerebral ischemic attack, unspecified: Secondary | ICD-10-CM

## 2023-02-11 MED ORDER — CLORAZEPATE DIPOTASSIUM 3.75 MG PO TABS: 3.7500 mg | ORAL_TABLET | Freq: Every day | ORAL | 3 refills | Status: DC | PRN

## 2023-02-11 MED ORDER — LAMOTRIGINE 100 MG PO TABS: 100.0000 mg | ORAL_TABLET | Freq: Two times a day (BID) | ORAL | 4 refills | Status: DC

## 2023-02-11 NOTE — Patient Instructions (Addendum)
  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

## 2023-02-11 NOTE — Progress Notes (Signed)
[facility]  PATIENT: Desiree Snow DOB: Dec 19, 1950  REFERRING CLINICIAN: [name], Adelfa Koh, MD HISTORY FROM: patient  REASON FOR VISIT: follow up   HISTORICAL  CHIEF COMPLAINT:  Chief Complaint  Patient presents with   Follow-up    Rm 6. Alone. Reports she had a TIA since her last visit. She self started on aspirin 81 mg. Reports a once second aura due to medication, no seizure like activity since. She is requesting tranxene to be sent to [facility].    HISTORY OF PRESENT ILLNESS:   UPDATE (02/11/23, VRP): Since last visit, doing well, except had 1 event of transient aphasia (30 seconds of word finding difficulty; no LOC; no weakness or numbness). No other events. No seizures. Had MRI, MRA.   UPDATE (02/05/22, VRP): Since last visit, doing well. Symptoms are stable. Lamotrigine stable. Had knee surgery Nov 2022 and doing well.   PRIOR HPI (02/01/21): 72 year old female here for evaluation of seizure disorder.  Patient had onset of seizures in 1963, diagnosed in 1965.  At that time patient was living in [place].  She was having weird sensations initially and then had complex partial seizures where she lost awareness.  She would still continue to be able to walk and move but would have memory lapse.  She was started on Dilantin, primidone, Tegretol without relief.  Patient continued to have seizures at least >25 times per month.  Eventually she had video EEG monitoring and underwent epilepsy surgery in 1998 at medical [place] [facility] [place].  Since that time patient has done extremely well and has not had any major seizures.  She rarely has brief ROS but does not have alteration of consciousness.  Patient was maintained on Keppra and Lamictal for a while after her seizure surgery, and then reduce to Keppra alone.  She noticed some issues with mood and therefore transitioned from Keppra to Lamictal in 2021.  Since that time patient is doing well.  Patient previously was  seeing neurologist in [facility] but requested transfer to care.  [place] where she lives.  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  She is currently not working but is looking for employment.   REVIEW OF SYSTEMS: Full 14 system review of systems performed and negative with exception of: As per HPI.  ALLERGIES: Allergies  Allergen Reactions   Cetirizine Other (See Comments)    Cognitive issues and "lowers seizure threshold."   Metronidazole Other (See Comments)    Strange feeling and inability to function   Quinolones    Scopolamine Hives     on her fingers no itchng   Penicillins Rash    HOME MEDICATIONS: Outpatient Medications Prior to Visit  Medication Sig Dispense Refill   Aspirin 81 MG CAPS Take 81 mg by mouth daily.     Calcium Carbonate-Vit D-Min (CALCIUM 1200 PO) Take 1 tablet by mouth daily.     Cholecalciferol (VITAMIN D3) 1000 units CAPS Take 1,000 Units by mouth daily.     CINNAMON PO Take 100 mg by mouth at bedtime.     famotidine (PEPCID) 20 MG tablet Take 20 mg by mouth daily.      Flaxseed, Linseed, (FLAX SEED OIL PO) Take 1 capsule by mouth daily.     Magnesium 250 MG TABS Take 250 mg by mouth daily.     Multiple Vitamin (MULTIVITAMIN) tablet Take 1 tablet by mouth  daily.     TURMERIC PO Take 1 tablet by mouth daily.     UNABLE TO FIND Take 1 capsule by mouth daily. Med Name: Collagen 2 otc once daily     clorazepate (TRANXENE) 3.75 MG tablet TAKE 1 TABLET BY MOUTH EVERY DAY AS NEEDED ANXIETY 30 tablet 1   lamoTRIgine (LAMICTAL) 100 MG tablet Take 1 tablet (100 mg total) by mouth 2 (two) times daily. 180 tablet 4   Zinc 50 MG CAPS Take 50 mg by mouth daily.     No facility-administered medications prior to visit.    PAST MEDICAL HISTORY: Past Medical History:  Diagnosis Date   Allergy    Arthritis    Chicken pox     Diverticulosis    Family history of uterine cancer 05/05/2021   GERD (gastroesophageal reflux disease)    Goiter    H/O febrile seizure    as infant, x 1, List of AEDs tried: Dilantin, Mysoline, Tegretol, Gabapentine, Depakote,  Keppra, and Lamictal,   Seizures (HCC)    "resolved" last seizure in 2002    PAST SURGICAL HISTORY: Past Surgical History:  Procedure Laterality Date   ADENOIDECTOMY  1963   BREAST BIOPSY Right 07/29/2020   COMPLEX SCLEROSING LESION WITH ATYPICAL DUCTAL   BREAST BIOPSY Right 01/01/2020   COMPLEX SCLEROSING LESION    BREAST EXCISIONAL BIOPSY Right 09/20/2020   complex sclerosing   BREAST EXCISIONAL BIOPSY Right 03/01/2020   [facility] IDC, CSL also considered   BREAST EXCISIONAL BIOPSY Left    ? Date  ?lipoma   BREAST LUMPECTOMY Left 2009   benign   BREAST LUMPECTOMY WITH RADIOACTIVE SEED LOCALIZATION Right 03/01/2020   Procedure: RIGHT BREAST LUMPECTOMY WITH RADIOACTIVE SEED LOCALIZATION;  Surgeon: Harriette Bouillon, MD;  Location: [facility];  Service: General;  Laterality: Right;   BREAST LUMPECTOMY WITH RADIOACTIVE SEED LOCALIZATION Right 09/20/2020   Procedure: RIGHT BREAST LUMPECTOMY WITH RADIOACTIVE SEED LOCALIZATION;  Surgeon: Harriette Bouillon, MD;  Location: [facility];  Service: General;  Laterality: Right;   KNEE SURGERY  2021   LOBECTOMY FOR SEIZURE FOCUS  1998   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   MENISECTOMY Left    SPLENECTOMY  1961   TONSILLECTOMY  1963   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   Procedure: TOTAL KNEE ARTHROPLASTY;  Surgeon: Beverely Low, MD;  Location: [facility];  Service: Orthopedics;  Laterality: Left;   TRIGGER FINGER RELEASE     WISDOM TOOTH EXTRACTION      FAMILY HISTORY: Family History  Problem Relation Age of Onset   Pneumonia Mother    Dementia Father    Uterine cancer Maternal Aunt        dx before 54   Hypertension Maternal Grandmother    Stroke  Maternal Grandmother    Dementia Paternal Grandfather    Lung cancer Cousin        paternal female cousin; dx after 72   Goiter Neg Hx    Thyroid cancer Neg Hx    Thyroid nodules Neg Hx    Thyroid disease Neg Hx     SOCIAL HISTORY: Social History   Socioeconomic History   Marital status: Single    Spouse name: Not on file   Number of children: 0   Years of education: Not on file   Highest education level: Master's degree (e.g., MA, MS, MEng, MEd, MSW, MBA)  Occupational History   Occupation: NP     Comment: NA  Tobacco Use  Smoking status: Former    Packs/day: 2.00    Years: 29.00    Additional pack years: 0.00    Total pack years: 58.00    Types: Cigarettes    Quit date: 10/08/2002    Years since quitting: 20.3   Smokeless tobacco: Never  Vaping Use   Vaping Use: Never used  Substance and Sexual Activity   Alcohol use: No   Drug use: No   Sexual activity: Not on file  Other Topics Concern   Not on file  Social History Narrative   Lives alone   Caffeine- 2 c coffee   Social Determinants of Health   Financial Resource Strain: Not on file  Food Insecurity: Not on file  Transportation Needs: Not on file  Physical Activity: Not on file  Stress: Not on file  Social Connections: Not on file  Intimate Partner Violence: Not on file     PHYSICAL EXAM  GENERAL EXAM/CONSTITUTIONAL: Vitals:  Vitals:   02/11/23 1522  BP: (!) 141/70  Pulse: 84  Weight: 207 lb 6.4 oz (94.1 kg)  Height: 5\' 6"  (1.676 m)   Body mass index is 33.48 kg/m. Wt Readings from Last 3 Encounters:  02/11/23 207 lb 6.4 oz (94.1 kg)  09/25/22 207 lb 12.8 oz (94.3 kg)  04/23/22 204 lb 8 oz (92.8 kg)   Patient is in no distress; well developed, nourished and groomed; neck is supple  CARDIOVASCULAR: Examination of carotid arteries is normal; no carotid bruits Regular rate and rhythm, no murmurs Examination of peripheral vascular system by observation and palpation is  normal  EYES: Ophthalmoscopic exam of optic discs and posterior segments is normal; no papilledema or hemorrhages No results found.  MUSCULOSKELETAL: Gait, strength, tone, movements noted in Neurologic exam below  NEUROLOGIC: MENTAL STATUS:      No data to display         awake, alert, oriented to person, place and time recent and remote memory intact normal attention and concentration language fluent, comprehension intact, naming intact fund of knowledge appropriate  CRANIAL NERVE:  2nd, 3rd, 4th, 6th - pupils equal and reactive to light, visual fields full to confrontation, extraocular muscles intact, no nystagmus 5th - facial sensation symmetric 7th - facial strength symmetric 8th - hearing intact 9th - palate elevates symmetrically, uvula midline 11th - shoulder shrug symmetric 12th - tongue protrusion midline  MOTOR:  normal bulk and tone, full strength in the BUE, BLE  SENSORY:  normal and symmetric to light touch, temperature, vibration  COORDINATION:  finger-nose-finger, fine finger movements normal  REFLEXES:  deep tendon reflexes TRACE and symmetric  GAIT/STATION:  narrow based gait     DIAGNOSTIC DATA (LABS, IMAGING, TESTING) - I reviewed patient records, labs, notes, testing and imaging myself where available.  Lab Results  Component Value Date   WBC 13.1 (H) 04/23/2022   HGB 13.2 04/23/2022   HCT 39.5 04/23/2022   MCV 93.2 04/23/2022   PLT 333 04/23/2022      Component Value Date/Time   NA 139 04/23/2022 1229   NA 140 11/22/2016 0000   K 4.2 04/23/2022 1229   CL 105 04/23/2022 1229   CO2 29 04/23/2022 1229   GLUCOSE 116 (H) 04/23/2022 1229   BUN 18 04/23/2022 1229   BUN 16 11/22/2016 0000   CREATININE 0.99 04/23/2022 1229   CALCIUM 9.3 04/23/2022 1229   PROT 6.9 04/23/2022 1229   ALBUMIN 4.1 04/23/2022 1229   AST 15 04/23/2022 1229   ALT 12  04/23/2022 1229   ALKPHOS 70 04/23/2022 1229   BILITOT 0.4 04/23/2022 1229   GFRNONAA  >60 04/23/2022 1229   GFRAA >60 02/26/2020 1200   Lab Results  Component Value Date   CHOL 187 12/25/2017   HDL 50.90 12/25/2017   LDLCALC 104 (H) 12/25/2017   TRIG 158.0 (H) 12/25/2017   CHOLHDL 4 12/25/2017   Lab Results  Component Value Date   HGBA1C 6.4 12/25/2017   No results found for: "VITAMINB12" Lab Results  Component Value Date   TSH 0.87 09/25/2022    09/18/19 MRI brain 1. Right temporal lobectomy without evidence for residual or recurrent tumor. 2. Otherwise normal MRI appearance of the brain.  04/25/22 MRI brain (without) demonstrating: -Stable right temporal lobectomy and expected postoperative changes. -No acute findings.  No change from 09/18/2019.  04/25/22 MRA head / neck - normal   ASSESSMENT AND PLAN  72 y.o. year old female here with:  Dx:  1. Seizure disorder (HCC)   2. 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 TAKE 1 TABLET BY MOUTH EVERY DAY AS NEEDED ANXIETY    Dispense:  30 tablet    Refill:  3   lamoTRIgine (LAMICTAL) 100 MG tablet    Sig: Take 1 tablet (100 mg total) by mouth 2 (two) times daily.    Dispense:  180 tablet    Refill:  4   Return in about 1 year (around 02/11/2024) for with NP.    Suanne Marker, MD 02/11/2023, 4:08 PM Certified in Neurology, Neurophysiology and  Neuroimaging  [facility] [address] [place], [place] [address] [phone]

## 2023-02-12 ENCOUNTER — Other Ambulatory Visit: Payer: Self-pay | Admitting: Diagnostic Neuroimaging

## 2023-02-12 DIAGNOSIS — G40909 Epilepsy, unspecified, not intractable, without status epilepticus: Secondary | ICD-10-CM

## 2023-02-12 DIAGNOSIS — G459 Transient cerebral ischemic attack, unspecified: Secondary | ICD-10-CM

## 2023-02-12 DIAGNOSIS — I4891 Unspecified atrial fibrillation: Secondary | ICD-10-CM

## 2023-02-14 ENCOUNTER — Telehealth: Payer: Self-pay | Admitting: *Deleted

## 2023-02-27 ENCOUNTER — Telehealth: Payer: Self-pay | Admitting: Genetic Counselor

## 2023-02-27 NOTE — Telephone Encounter (Signed)
Patient called back.  Contact from [employer] was from automatic release from portal about VUS downgrade.  Patient had no further questions.

## 2023-02-27 NOTE — Telephone Encounter (Signed)
Per Maylon Cos, patient called with questions about new report/letter she received from [facility]  Per [employer], no report/letter had been sent since downgrade reclassification in Jan 2024. Heredtiary Cancer Genetic testing is negative.  Called patient to answer questions and discuss further.  LVM with contact information, requesting call back.

## 2023-03-07 ENCOUNTER — Other Ambulatory Visit: Payer: Self-pay

## 2023-03-13 NOTE — Telephone Encounter (Signed)
error 

## 2023-03-18 ENCOUNTER — Encounter: Payer: Self-pay | Admitting: Internal Medicine

## 2023-03-18 ENCOUNTER — Ambulatory Visit (INDEPENDENT_AMBULATORY_CARE_PROVIDER_SITE_OTHER): Payer: Medicare Other | Admitting: Internal Medicine

## 2023-03-18 VITALS — BP 122/80 | HR 85 | Temp 97.7°F | Ht 66.0 in | Wt 210.0 lb

## 2023-03-18 DIAGNOSIS — K219 Gastro-esophageal reflux disease without esophagitis: Secondary | ICD-10-CM | POA: Diagnosis not present

## 2023-03-18 DIAGNOSIS — R0609 Other forms of dyspnea: Secondary | ICD-10-CM

## 2023-03-18 DIAGNOSIS — G40909 Epilepsy, unspecified, not intractable, without status epilepticus: Secondary | ICD-10-CM

## 2023-03-18 DIAGNOSIS — R7301 Impaired fasting glucose: Secondary | ICD-10-CM | POA: Diagnosis not present

## 2023-03-18 DIAGNOSIS — E042 Nontoxic multinodular goiter: Secondary | ICD-10-CM | POA: Diagnosis not present

## 2023-03-18 NOTE — Patient Instructions (Signed)
Your EKG looks normal today.   Come for when due for physical or sooner if needed.

## 2023-03-18 NOTE — Progress Notes (Unsigned)
   Subjective:   Patient ID: Desiree Snow, female    DOB: [birth year], 72 y.o.   MRN: [id number]  HPI The patient is a new 72 YO female coming in for ongoing care see A/P  PMH, [facility], social history reviewed and updated  Review of Systems  Objective:  Physical Exam  Vitals:   03/18/23 1438  BP: 122/80  Pulse: 85  Temp: 97.7 F (36.5 C)  TempSrc: Oral  SpO2: 96%  Weight: 210 lb (95.3 kg)  Height: 5\' 6"  (1.676 m)   EKG: Rate 80, axis normal, interval normal, sinus, low voltage, no st or t wave changes, no prior to compare   Assessment & Plan:

## 2023-03-19 ENCOUNTER — Other Ambulatory Visit (INDEPENDENT_AMBULATORY_CARE_PROVIDER_SITE_OTHER): Payer: Medicare Other

## 2023-03-19 ENCOUNTER — Encounter: Payer: Self-pay | Admitting: Gastroenterology

## 2023-03-19 ENCOUNTER — Ambulatory Visit: Payer: Medicare Other | Admitting: Gastroenterology

## 2023-03-19 VITALS — BP 128/60 | HR 88 | Ht 65.0 in | Wt 209.4 lb

## 2023-03-19 DIAGNOSIS — R194 Change in bowel habit: Secondary | ICD-10-CM | POA: Diagnosis not present

## 2023-03-19 DIAGNOSIS — R195 Other fecal abnormalities: Secondary | ICD-10-CM

## 2023-03-19 LAB — CBC WITH DIFFERENTIAL/PLATELET
Basophils Absolute: 0.1 10*3/uL (ref 0.0–0.1)
Basophils Relative: 1.1 % (ref 0.0–3.0)
Eosinophils Absolute: 0.9 10*3/uL — ABNORMAL HIGH (ref 0.0–0.7)
Eosinophils Relative: 9.8 % — ABNORMAL HIGH (ref 0.0–5.0)
HCT: 39.4 % (ref 36.0–46.0)
Hemoglobin: 12.6 g/dL (ref 12.0–15.0)
Lymphocytes Relative: 27 % (ref 12.0–46.0)
Lymphs Abs: 2.5 10*3/uL (ref 0.7–4.0)
MCHC: 32 g/dL (ref 30.0–36.0)
MCV: 91.9 fl (ref 78.0–100.0)
Monocytes Absolute: 1 10*3/uL (ref 0.1–1.0)
Monocytes Relative: 10.5 % (ref 3.0–12.0)
Neutro Abs: 4.7 10*3/uL (ref 1.4–7.7)
Neutrophils Relative %: 51.6 % (ref 43.0–77.0)
Platelets: 312 10*3/uL (ref 150.0–400.0)
RBC: 4.29 Mil/uL (ref 3.87–5.11)
RDW: 13.8 % (ref 11.5–15.5)
WBC: 9.1 10*3/uL (ref 4.0–10.5)

## 2023-03-19 LAB — IBC + FERRITIN
Ferritin: 6.9 ng/mL — ABNORMAL LOW (ref 10.0–291.0)
Iron: 54 ug/dL (ref 42–145)
Saturation Ratios: 12.8 % — ABNORMAL LOW (ref 20.0–50.0)
TIBC: 422.8 ug/dL (ref 250.0–450.0)
Transferrin: 302 mg/dL (ref 212.0–360.0)

## 2023-03-19 MED ORDER — NA SULFATE-K SULFATE-MG SULF 17.5-3.13-1.6 GM/177ML PO SOLN: 1.0000 | Freq: Once | ORAL | 0 refills | Status: AC

## 2023-03-19 NOTE — Patient Instructions (Addendum)
_______________________________________________________  If your blood pressure at your visit was 140/90 or greater, please contact your primary care physician to follow up on this.  _______________________________________________________  If you are age 73 or older, your body mass index should be between 23-30. Your Body mass index is 34.84 kg/m. If this is out of the aforementioned range listed, please consider follow up with your Primary Care Provider.  If you are age 64 or younger, your body mass index should be between 19-25. Your Body mass index is 34.84 kg/m. If this is out of the aformentioned range listed, please consider follow up with your Primary Care Provider.   ________________________________________________________  The [facility] providers would like to encourage you to use [facility] to communicate with providers for non-urgent requests or questions.  Due to long hold times on the telephone, sending your provider a message by [facility] may be a faster and more efficient way to get a response.  Please allow 48 business hours for a response.  Please remember that this is for non-urgent requests.  _______________________________________________________  _______________________________________________________  If your blood pressure at your visit was 140/90 or greater, please contact your primary care physician to follow up on this.  _______________________________________________________  If you are age 50 or older, your body mass index should be between 23-30. Your Body mass index is 34.84 kg/m. If this is out of the aforementioned range listed, please consider follow up with your Primary Care Provider.  If you are age 15 or younger, your body mass index should be between 19-25. Your Body mass index is 34.84 kg/m. If this is out of the aformentioned range listed, please consider follow up with your Primary Care Provider.    ________________________________________________________ Bonita Quin have been scheduled for a colonoscopy. Please follow written instructions given to you at your visit today.  Please pick up your prep supplies at the pharmacy within the next 1-3 days. If you use inhalers (even only as needed), please bring them with you on the day of your procedure.  Your provider has requested that you go to the basement level for lab work before leaving today. Press "B" on the elevator. The lab is located at the first door on the left as you exit the elevator.  Due to recent changes in healthcare laws, you may see the results of your imaging and laboratory studies on MyChart before your provider has had a chance to review them.  We understand that in some cases there may be results that are confusing or concerning to you. Not all laboratory results come back in the same time frame and the provider may be waiting for multiple results in order to interpret others.  Please give [place] 48 hours in order for your provider to thoroughly review all the results before contacting the office for clarification of your results.   It was a pleasure to see you today!  Thank you for trusting me with your gastrointestinal care!

## 2023-03-19 NOTE — Progress Notes (Signed)
[facility] Consult Note:  History: Desiree Snow 03/19/2023  Referring provider: Guerry Bruin, MD  Reason for consult/chief complaint: heme positive stool test, Diarrhea (Intermittent episodes of diarrhea ), and Constipation (Intermittent episodes of constipation/)  Subjective  HPI: Referred by primary care ([name]) for FOBT positive stool on a routine check.  According to Kay,her last colonoscopy was in 2018 at [facility] for a 10 year recall where several polyps and diverticulosis were found.  When she contacted them years later to get the report reports of previous findings, she was advised to have a 10-year colonoscopy recall.  Those records are not available to [place] today.  Today, we reviewed her last visit with Dr.[name] and discussed her hem positive stool test. She currently denies any blood in stool or black stool. She states that she typically has regular BM but has had a few episodes episodes of fecal urgency described as explosive diarrhea.  Did not know if she may have had a food trigger.  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  She reports having internal hemorrhoids that would occasionally cause itchiness.    She reports taking aspirin.    She denies nausea, vomiting, abdominal pain, bloating, unintentional weight loss, reflux, dysphagia.  ROS:  Review of Systems  Constitutional:  Negative for appetite change and fever.  HENT:  Negative for trouble swallowing.   Respiratory:  Negative for cough and shortness of breath.   Cardiovascular:  Negative for chest pain.  Gastrointestinal:  Positive for constipation and diarrhea. Negative for abdominal distention, abdominal pain, anal bleeding, blood in stool, nausea, rectal pain and vomiting.  Genitourinary:  Negative for dysuria.  Musculoskeletal:  Negative for back  pain.  Skin:  Negative for rash.  Neurological:  Negative for weakness.  All other systems reviewed and are negative.    Past Medical History: Past Medical History:  Diagnosis Date   Allergy    Anxiety    Arthritis    Chicken pox    Colon polyps    Diverticulosis    Family history of uterine cancer 05/05/2021   GERD (gastroesophageal reflux disease)    Goiter    H/O febrile seizure    as infant, x 1, List of AEDs tried: Dilantin, Mysoline, Tegretol, Gabapentine, Depakote,  Keppra, and Lamictal,   Seizures (HCC)    "resolved" last seizure in 2002     Past Surgical History: Past Surgical History:  Procedure Laterality Date   ADENOIDECTOMY  1963   BREAST BIOPSY Right 07/29/2020   COMPLEX SCLEROSING LESION WITH ATYPICAL DUCTAL   BREAST BIOPSY Right 01/01/2020   COMPLEX SCLEROSING LESION    BREAST EXCISIONAL BIOPSY Right 09/20/2020   complex sclerosing   BREAST EXCISIONAL BIOPSY Right 03/01/2020   Favor IDC, CSL also considered   BREAST EXCISIONAL BIOPSY Left    ? Date  ?lipoma   BREAST LUMPECTOMY Left 2009   benign   BREAST LUMPECTOMY WITH RADIOACTIVE SEED LOCALIZATION Right 03/01/2020   Procedure: RIGHT BREAST LUMPECTOMY WITH RADIOACTIVE SEED LOCALIZATION;  Surgeon: Harriette Bouillon, MD;  Location: [facility];  Service: General;  Laterality: Right;   BREAST LUMPECTOMY WITH RADIOACTIVE SEED LOCALIZATION Right 09/20/2020   Procedure: RIGHT BREAST LUMPECTOMY WITH RADIOACTIVE SEED LOCALIZATION;  Surgeon: Harriette Bouillon, MD;  Location:  SURGERY CENTER;  Service: General;  Laterality: Right;   KNEE SURGERY  2021   LOBECTOMY FOR SEIZURE FOCUS  1998   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   MENISECTOMY Left    SPLENECTOMY  1961   TONSILLECTOMY  1963   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   Procedure: TOTAL KNEE ARTHROPLASTY;  Surgeon: Beverely Low, MD;  Location: [facility];  Service: Orthopedics;  Laterality: Left;    TRIGGER FINGER RELEASE     WISDOM TOOTH EXTRACTION       Family History: Family History  Problem Relation Age of Onset   Pneumonia Mother    Dementia Father    Uterine cancer Maternal Aunt        dx before 6   Hypertension Maternal Grandmother    Stroke Maternal Grandmother    Dementia Paternal Grandfather    Lung cancer Cousin        paternal female cousin; dx after 28   Goiter Neg Hx    Thyroid cancer Neg Hx    Thyroid nodules Neg Hx    Thyroid disease Neg Hx     Social History: Social History   Socioeconomic History   Marital status: Single    Spouse name: Not on file   Number of children: 0   Years of education: Not on file   Highest education level: Master's degree (e.g., MA, MS, MEng, MEd, MSW, MBA)  Occupational History   Occupation: NP     Comment: NA  Tobacco Use   Smoking status: Former    Packs/day: 2.00    Years: 29.00    Additional pack years: 0.00    Total pack years: 58.00    Types: Cigarettes    Quit date: 10/08/2002    Years since quitting: 20.4   Smokeless tobacco: Never  Vaping Use   Vaping Use: Never used  Substance and Sexual Activity   Alcohol use: No   Drug use: No   Sexual activity: Not on file  Other Topics Concern   Not on file  Social History Narrative   Lives alone   Caffeine- 2 c coffee   Social Determinants of Health   Financial Resource Strain: Not on file  Food Insecurity: Not on file  Transportation Needs: Not on file  Physical Activity: Not on file  Stress: Not on file  Social Connections: Not on file    Allergies: Allergies  Allergen Reactions   Cetirizine Other (See Comments)    Cognitive issues and "lowers seizure threshold."   Metronidazole Other (See Comments)    Strange feeling and inability to function   Quinolones    Scopolamine Hives     on her fingers no itchng   Penicillins Rash    Outpatient Meds: Current Outpatient Medications  Medication Sig Dispense Refill   Aspirin 81 MG CAPS Take 81 mg  by mouth daily.     Calcium Carbonate-Vit D-Min (CALCIUM 1200 PO) Take 1 tablet by mouth daily.     Cholecalciferol (VITAMIN D3) 1000 units CAPS Take 1,000 Units by mouth daily.     CINNAMON PO Take 100 mg by mouth at bedtime.     clorazepate (TRANXENE) 3.75 MG tablet Take 1 tablet (3.75 mg total) by mouth daily as needed for anxiety. TAKE 1 TABLET BY MOUTH EVERY DAY AS NEEDED ANXIETY 30 tablet 3   famotidine (PEPCID) 20 MG tablet Take 20 mg by mouth daily.      Flaxseed, Linseed, (FLAX SEED OIL PO) Take 1 capsule by mouth daily.     lamoTRIgine (LAMICTAL) 100 MG tablet Take  1 tablet (100 mg total) by mouth 2 (two) times daily. 180 tablet 4   Magnesium 250 MG TABS Take 250 mg by mouth daily.     Multiple Vitamin (MULTIVITAMIN) tablet Take 1 tablet by mouth daily.     TURMERIC PO Take 1 tablet by mouth daily.     UNABLE TO FIND Take 1 capsule by mouth daily. Med Name: Collagen 2 otc once daily     No current facility-administered medications for this visit.      ___________________________________________________________________ Objective   Exam:  Ht 5\' 5"  (1.651 m) Comment: height measured without shoes  Wt 209 lb 6 oz (95 kg)   BMI 34.84 kg/m  Wt Readings from Last 3 Encounters:  03/19/23 209 lb 6 oz (95 kg)  03/18/23 210 lb (95.3 kg)  02/11/23 207 lb 6.4 oz (94.1 kg)    General: well appearing  Eyes: sclera anicteric, no redness ENT: oral mucosa moist without lesions, no cervical or supraclavicular lymphadenopathy CV: RRR, no JVD, no peripheral edema Resp: clear to auscultation bilaterally, normal RR and effort noted GI: soft, no tenderness, with active bowel sounds. No guarding or palpable organomegaly noted. Skin; warm and dry, no rash or jaundice noted Neuro: awake, alert and oriented x 3. Normal gross motor function and fluent speech  Labs:  Referral records reviewed-normal CBC, CMP and TSH on 12/14/2022  FOBT positive  Radiologic Studies:  Assessment: Heme  positive stool  Altered bowel habits   Episodic altered bowel habits sound benign. Heme positive stool, no overt GI bleeding.  Not anemic at last check with primary care.  Plan: -Colonoscopy  -she was agreeable after discussion of procedure and risks.  The benefits and risks of the planned procedure were described in detail with the patient or (when appropriate) their health care proxy.  Risks were outlined as including, but not limited to, bleeding, infection, perforation, adverse medication reaction leading to cardiac or pulmonary decompensation, pancreatitis (if ERCP).  The limitation of incomplete mucosal visualization was also discussed.  No guarantees or warranties were given.   -Iron studies and CBC today.  If anemic and her iron deficient, add EGD.  Thank you for the courtesy of this consult.  Please call me with any questions or concerns.   - Amada Jupiter, MD    [facility]   Ladona Mow M Kadhim,acting as a scribe for Charlie Pitter III, MD.,have documented all relevant documentation on the behalf of Sherrilyn Rist, MD,as directed by  Sherrilyn Rist, MD while in the presence of Sherrilyn Rist, MD.   Marvis Repress III, MD, have reviewed all documentation for this visit. The documentation on 03/19/23 for the exam, diagnosis, procedures, and orders are all accurate and complete.   CC: Referring provider noted above Also Dr. Hillard Danker, who is now the patient's new PCP.

## 2023-03-19 NOTE — Progress Notes (Deleted)
[facility] Consult Note:  History: Desiree Snow 03/19/2023  Referring provider: Guerry Bruin, MD  Reason for consult/chief complaint: No chief complaint on file.   Subjective  HPI: Referred by primary care ([name]) for FOBT positive stool on a routine check.  Referral notes "last colonoscopy 3/20". ***   ROS:  Review of Systems   Past Medical History: Past Medical History:  Diagnosis Date   Allergy    Arthritis    Chicken pox    Diverticulosis    Family history of uterine cancer 05/05/2021   GERD (gastroesophageal reflux disease)    Goiter    H/O febrile seizure    as infant, x 1, List of AEDs tried: Dilantin, Mysoline, Tegretol, Gabapentine, Depakote,  Keppra, and Lamictal,   Seizures (HCC)    "resolved" last seizure in 2002     Past Surgical History: Past Surgical History:  Procedure Laterality Date   ADENOIDECTOMY  1963   BREAST BIOPSY Right 07/29/2020   COMPLEX SCLEROSING LESION WITH ATYPICAL DUCTAL   BREAST BIOPSY Right 01/01/2020   COMPLEX SCLEROSING LESION    BREAST EXCISIONAL BIOPSY Right 09/20/2020   complex sclerosing   BREAST EXCISIONAL BIOPSY Right 03/01/2020   Favor IDC, CSL also considered   BREAST EXCISIONAL BIOPSY Left    ? Date  ?lipoma   BREAST LUMPECTOMY Left 2009   benign   BREAST LUMPECTOMY WITH RADIOACTIVE SEED LOCALIZATION Right 03/01/2020   Procedure: RIGHT BREAST LUMPECTOMY WITH RADIOACTIVE SEED LOCALIZATION;  Surgeon: Harriette Bouillon, MD;  Location: [facility];  Service: General;  Laterality: Right;   BREAST LUMPECTOMY WITH RADIOACTIVE SEED LOCALIZATION Right 09/20/2020   Procedure: RIGHT BREAST LUMPECTOMY WITH RADIOACTIVE SEED LOCALIZATION;  Surgeon: Harriette Bouillon, MD;  Location: [facility];  Service: General;  Laterality: Right;   KNEE SURGERY  2021   LOBECTOMY FOR SEIZURE FOCUS  1998   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   MENISECTOMY  Left    SPLENECTOMY  1961   TONSILLECTOMY  1963   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   Procedure: TOTAL KNEE ARTHROPLASTY;  Surgeon: Beverely Low, MD;  Location: [facility];  Service: Orthopedics;  Laterality: Left;   TRIGGER FINGER RELEASE     WISDOM TOOTH EXTRACTION       Family History: Family History  Problem Relation Age of Onset   Pneumonia Mother    Dementia Father    Uterine cancer Maternal Aunt        dx before 72   Hypertension Maternal Grandmother    Stroke Maternal Grandmother    Dementia Paternal Grandfather    Lung cancer Cousin        paternal female cousin; dx after 89   Goiter Neg Hx    Thyroid cancer Neg Hx    Thyroid nodules Neg Hx    Thyroid disease Neg Hx     Social History: Social History   Socioeconomic History   Marital status: Single    Spouse name: Not on file   Number of children: 0   Years of education: Not on file   Highest education level: Master's degree (e.g., MA, MS, MEng, MEd, MSW, MBA)  Occupational History   Occupation: NP     Comment: NA  Tobacco Use   Smoking status: Former    Packs/day: 2.00    Years: 29.00    Additional pack years: 0.00    Total pack years: 58.00    Types: Cigarettes    Quit date:  10/08/2002    Years since quitting: 20.4   Smokeless tobacco: Never  Vaping Use   Vaping Use: Never used  Substance and Sexual Activity   Alcohol use: No   Drug use: No   Sexual activity: Not on file  Other Topics Concern   Not on file  Social History Narrative   Lives alone   Caffeine- 2 c coffee   Social Determinants of Health   Financial Resource Strain: Not on file  Food Insecurity: Not on file  Transportation Needs: Not on file  Physical Activity: Not on file  Stress: Not on file  Social Connections: Not on file    Allergies: Allergies  Allergen Reactions   Cetirizine Other (See Comments)    Cognitive issues and "lowers seizure threshold."   Metronidazole Other (See Comments)    Strange feeling and  inability to function   Quinolones    Scopolamine Hives     on her fingers no itchng   Penicillins Rash    Outpatient Meds: Current Outpatient Medications  Medication Sig Dispense Refill   Aspirin 81 MG CAPS Take 81 mg by mouth daily.     Calcium Carbonate-Vit D-Min (CALCIUM 1200 PO) Take 1 tablet by mouth daily.     Cholecalciferol (VITAMIN D3) 1000 units CAPS Take 1,000 Units by mouth daily.     CINNAMON PO Take 100 mg by mouth at bedtime.     clorazepate (TRANXENE) 3.75 MG tablet Take 1 tablet (3.75 mg total) by mouth daily as needed for anxiety. TAKE 1 TABLET BY MOUTH EVERY DAY AS NEEDED ANXIETY 30 tablet 3   famotidine (PEPCID) 20 MG tablet Take 20 mg by mouth daily.      Flaxseed, Linseed, (FLAX SEED OIL PO) Take 1 capsule by mouth daily.     lamoTRIgine (LAMICTAL) 100 MG tablet Take 1 tablet (100 mg total) by mouth 2 (two) times daily. 180 tablet 4   Magnesium 250 MG TABS Take 250 mg by mouth daily.     Multiple Vitamin (MULTIVITAMIN) tablet Take 1 tablet by mouth daily.     TURMERIC PO Take 1 tablet by mouth daily.     UNABLE TO FIND Take 1 capsule by mouth daily. Med Name: Collagen 2 otc once daily     No current facility-administered medications for this visit.      ___________________________________________________________________ Objective   Exam:  There were no vitals taken for this visit. Wt Readings from Last 3 Encounters:  03/18/23 210 lb (95.3 kg)  02/11/23 207 lb 6.4 oz (94.1 kg)  09/25/22 207 lb 12.8 oz (94.3 kg)    General: ***  Eyes: sclera anicteric, no redness ENT: oral mucosa moist without lesions, no cervical or supraclavicular lymphadenopathy CV: ***, no JVD, no peripheral edema Resp: clear to auscultation bilaterally, normal RR and effort noted GI: soft, *** tenderness, with active bowel sounds. No guarding or palpable organomegaly noted. Skin; warm and dry, no rash or jaundice noted Neuro: awake, alert and oriented x 3. Normal gross motor  function and fluent speech  Labs:  Referral records reviewed-normal CBC, CMP and TSH on 12/14/2022  FOBT positive  Radiologic Studies:  ***  Assessment: No diagnosis found.  ***  Plan:  ***  Thank you for the courtesy of this consult.  Please call me with any questions or concerns.  Charlie Pitter III  CC: Referring provider noted above

## 2023-03-21 ENCOUNTER — Encounter: Payer: Self-pay | Admitting: Internal Medicine

## 2023-03-21 NOTE — Assessment & Plan Note (Signed)
Recent thyroid labs and [place] reviewed and stable over time. No new dysphagia symptoms.

## 2023-03-21 NOTE — Assessment & Plan Note (Signed)
Recent labs at goal and will repeat yearly.

## 2023-03-21 NOTE — Assessment & Plan Note (Signed)
Taking pepcid 20 mg daily otc and symptoms well controlled.

## 2023-03-21 NOTE — Assessment & Plan Note (Signed)
Intermittent and EKG done today to assess as none in some time. This is normal. BP is normal. Prior PFTS. She will work on exercise to see if this makes a difference. Recent labs which I have reviewed with her during visit.

## 2023-03-21 NOTE — Assessment & Plan Note (Signed)
No recent seizures and taking lamictal 100 mg BID and has as needed benzos for seizures as needed.

## 2023-04-01 ENCOUNTER — Encounter: Payer: Self-pay | Admitting: Internal Medicine

## 2023-04-01 ENCOUNTER — Ambulatory Visit: Payer: Medicare Other | Admitting: Internal Medicine

## 2023-04-01 VITALS — BP 132/70 | HR 86 | Ht 65.0 in | Wt 209.0 lb

## 2023-04-01 DIAGNOSIS — E042 Nontoxic multinodular goiter: Secondary | ICD-10-CM

## 2023-04-01 DIAGNOSIS — Z77098 Contact with and (suspected) exposure to other hazardous, chiefly nonmedicinal, chemicals: Secondary | ICD-10-CM

## 2023-04-01 NOTE — Progress Notes (Signed)
Patient ID: Desiree Snow, female   DOB: [birth year], 72 y.o.   MRN: [id number]  HPI  Desiree Snow is a 72 y.o.-year-old female, returning for follow-up for multinodular goiter.  She previously saw Dr. Everardo All, but last visit with me 6 months ago.  Interim history: Patient noticed that she needs more effort for getting her voice out while singing.  Also, her voice is more pitched.  She does have postnasal drip and also has shortness of breath.  No pain with swallowing.  She will have an EGD + colonoscopy next mo.  Past thyroid history: Patient was diagnosed with thyroid nodules in 04/2017 -these were incidentally seen on a CT scan of her neck.  Thyroid U/S (06/13/2017): She had 3 thyroid nodules in the left lobe, of which the inferior and nodules met the criteria for biopsy, while the sup nodule qualified for 1 year follow-up  Bx (06/25/2017) of the L inf nodule (2.1 cm): benign  Thyroid U/S (07/02/2018): Stable nodules  Thyroid U/S (07/27/2019): Stable nodules  Thyroid U/S (07/20/2020): Stable nodules, with the left mid thyroid nodule not meeting criteria for follow-up or biopsy anymore  Thyroid U/S (07/18/2021): Stable nodules  Thyroid U/S (08/06/2022: Stable nodules except for the left inferior thyroid nodule, which increased in size from 2.3 to 2.8 cm and now meets criteria for biopsy: Isthmus: 0.8 cm  Right lobe: 5.1 cm x 1.6 cm x 1.8 cm  Left lobe: 5.2 cm x 1.9 cm x 2.1 cm  ________________________________________________________   Estimated total number of nodules >/= 1 cm: 5 _________________________________________________________   Nodule labeled 1 in the isthmus, 1.1 cm, unchanged. Nodule remains TR 4 and has been stable now 5 years. Discontinuing surveillance may be reasonable.   Nodule labeled 2, superior right thyroid, 1.2 cm. Nodule remains TR 3, smaller than prior, and does not meet criteria for surveillance.   Nodule labeled 3, mid right thyroid, 8  mm with spongiform characteristics and does not meet criteria for surveillance.   Nodule labeled 4, inferior right thyroid, 0.9 cm. Nodule remains unchanged and does not meet criteria for surveillance.   Nodule labeled 5, left superior thyroid, 1.8 cm, unchanged. Nodule remains TR 3 and appears stable for 5 years. Discontinuing surveillance may be reasonable.   Nodule labeled 6, mid left thyroid, 1.0 cm this nodule again demonstrates ill-defined borders, potentially a pseudo nodule.   Nodule labeled 7, inferior left thyroid, previously 2.3 cm, now 2.8 cm. Nodule remains TR 3 and now meets criteria for biopsy.    No adenopathy   IMPRESSION: Multinodular thyroid goiter again demonstrated, as above.   The only significant change would be the larger size of nodule labeled 7 inferior left thyroid (2.8 cm, TR 3) which now meets criteria for biopsy as designated by the newly established ACR TI-RADS criteria, and referral for biopsy is recommended.   The other previously documented nodules (labeled 1 and 5) have now demonstrated 5 years of stability and discontinuing surveillance may be reasonable.  I reviewed pt's thyroid tests: Lab Results  Component Value Date   TSH 0.87 09/25/2022   TSH 0.69 10/27/2021   TSH 1.50 07/12/2021   TSH 1.46 07/12/2020   TSH 0.87 07/07/2019   TSH 1.27 12/25/2017   TSH 1.22 11/22/2016   TSH 1.29 11/24/2015   FREET4 0.81 09/25/2022   FREET4 0.76 10/27/2021   FREET4 0.84 07/12/2021   FREET4 0.74 07/12/2020    No FH of thyroid ds. No FH of thyroid cancer. No  h/o radiation tx to head or neck. No recent contrast studies. No steroid use.  + A small amount of biotin supplement -in B complex.    Pt also has a history of GERD, distant history of seizure disorder, obesity class I. She also tells me that her cat has hypothyroidism and she is putting methimazole gel on her years.   I advised her to use gloves.  ROS: + See HPI   Past Medical History:   Diagnosis Date   Allergy    Anxiety    Arthritis    Chicken pox    Colon polyps    Diverticulosis    Family history of uterine cancer 05/05/2021   GERD (gastroesophageal reflux disease)    H/O febrile seizure    as infant, x 1, List of AEDs tried: Dilantin, Mysoline, Tegretol, Gabapentine, Depakote,  Keppra, and Lamictal,   Seizures (HCC)    "resolved" last seizure in 2002   Thyroid goiter    Past Surgical History:  Procedure Laterality Date   BREAST BIOPSY Right 07/29/2020   COMPLEX SCLEROSING LESION WITH ATYPICAL DUCTAL   BREAST BIOPSY Right 01/01/2020   COMPLEX SCLEROSING LESION    BREAST EXCISIONAL BIOPSY Right 09/20/2020   complex sclerosing   BREAST EXCISIONAL BIOPSY Right 03/01/2020   Favor IDC, CSL also considered   BREAST EXCISIONAL BIOPSY Left    ? Date  ?lipoma   BREAST LUMPECTOMY WITH RADIOACTIVE SEED LOCALIZATION Right 03/01/2020   Procedure: RIGHT BREAST LUMPECTOMY WITH RADIOACTIVE SEED LOCALIZATION;  Surgeon: Harriette Bouillon, MD;  Location: [facility];  Service: General;  Laterality: Right;   BREAST LUMPECTOMY WITH RADIOACTIVE SEED LOCALIZATION Right 09/20/2020   Procedure: RIGHT BREAST LUMPECTOMY WITH RADIOACTIVE SEED LOCALIZATION;  Surgeon: Harriette Bouillon, MD;  Location: [facility];  Service: General;  Laterality: Right;   KNEE SURGERY  2021   LOBECTOMY FOR SEIZURE FOCUS  1998   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   MENISECTOMY Left    SPLENECTOMY  1961   TONSILLECTOMY AND ADENOIDECTOMY  1963   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   Procedure: TOTAL KNEE ARTHROPLASTY;  Surgeon: Beverely Low, MD;  Location: [facility];  Service: Orthopedics;  Laterality: Left;   TRIGGER FINGER RELEASE Right    middle   WISDOM TOOTH EXTRACTION     Social History   Socioeconomic History   Marital status: Single    Spouse name: Not on file   Number of children: 0   Years of education: Not on file   Highest  education level: Master's degree (e.g., MA, MS, MEng, MEd, MSW, MBA)  Occupational History   Occupation: NP     Comment: NA  Tobacco Use   Smoking status: Former    Packs/day: 2.00    Years: 29.00    Additional pack years: 0.00    Total pack years: 58.00    Types: Cigarettes    Quit date: 10/08/2002    Years since quitting: 20.4   Smokeless tobacco: Never  Vaping Use   Vaping Use: Never used  Substance and Sexual Activity   Alcohol use: No   Drug use: No   Sexual activity: Not on file  Other Topics Concern   Not on file  Social History Narrative   Lives alone   Caffeine- 2 c coffee   Social Determinants of Health   Financial Resource Strain: Not on file  Food Insecurity: Not on file  Transportation Needs: Not on file  Physical Activity: Not on  file  Stress: Not on file  Social Connections: Not on file  Intimate Partner Violence: Not on file   Current Outpatient Medications on File Prior to Visit  Medication Sig Dispense Refill   Aspirin 81 MG CAPS Take 81 mg by mouth daily.     Calcium Carbonate-Vit D-Min (CALCIUM 1200 PO) Take 1 tablet by mouth daily.     Cholecalciferol (VITAMIN D3) 1000 units CAPS Take 1,000 Units by mouth daily.     CINNAMON PO Take 100 mg by mouth at bedtime.     clorazepate (TRANXENE) 3.75 MG tablet Take 1 tablet (3.75 mg total) by mouth daily as needed for anxiety. TAKE 1 TABLET BY MOUTH EVERY DAY AS NEEDED ANXIETY 30 tablet 3   famotidine (PEPCID) 20 MG tablet Take 20 mg by mouth daily.      Flaxseed, Linseed, (FLAX SEED OIL PO) Take 1 capsule by mouth daily.     lamoTRIgine (LAMICTAL) 100 MG tablet Take 1 tablet (100 mg total) by mouth 2 (two) times daily. 180 tablet 4   Magnesium 250 MG TABS Take 250 mg by mouth daily.     Multiple Vitamin (MULTIVITAMIN) tablet Take 1 tablet by mouth daily.     TURMERIC PO Take 1 tablet by mouth daily.     UNABLE TO FIND Take 1 capsule by mouth daily. Med Name: Collagen 2 otc once daily     No current  facility-administered medications on file prior to visit.   Allergies  Allergen Reactions   Cetirizine Other (See Comments)    Cognitive issues and "lowers seizure threshold."   Metronidazole Other (See Comments)    Strange feeling and inability to function   Quinolones    Scopolamine Hives     on her fingers no itchng   Penicillins Rash   Family History  Problem Relation Age of Onset   Pneumonia Mother    Dementia Father    Hypertension Maternal Grandmother    Stroke Maternal Grandmother    Clotting disorder Paternal Grandmother    Dementia Paternal Grandfather    Uterine cancer Maternal Aunt        dx before 44   Lung cancer Cousin        paternal female cousin; dx after 10   Diabetes Maternal Uncle        x 2   Goiter Neg Hx    Thyroid cancer Neg Hx    Thyroid nodules Neg Hx    Thyroid disease Neg Hx    PE: There were no vitals taken for this visit. Wt Readings from Last 3 Encounters:  03/19/23 209 lb 6 oz (95 kg)  03/18/23 210 lb (95.3 kg)  02/11/23 207 lb 6.4 oz (94.1 kg)   Constitutional: overweight, in NAD Eyes:  EOMI, no exophthalmos ENT: no neck masses, no cervical lymphadenopathy Cardiovascular: RRR, No MRG Respiratory: CTA B Musculoskeletal: no deformities Skin:no rashes Neurological: no tremor with outstretched hands  ASSESSMENT: 1. 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 Thyroid nodules -Patient with history of multiple thyroid nodules that have been followed with annual ultrasounds since 2018.  On the latest thyroid ultrasound from 07/2022, nodules appears to be stable except for the left inferior thyroid nodule, which increased in size from 2.3 to 2.8 cm.  The nodule was large, and hypoechoic but otherwise did not have other risk factors for cancer including microcalcifications, internal blood flow, taller than wide distribution and irregular contours.  Also, she does not have a  history of radiation therapy to head or neck or family  history of thyroid cancer.  The nodule was biopsied in 2018 with benign results.   -She had another FNA of the left thyroid nodule performed on 10/11/2022 and the results were benign.  After 2 benign biopsies, we discussed that the risk of cancer are almost 0. -She does describe voice giving out when singing and also higher pitched tonality. -At last visit we discussed about possibly checking a barium swallow to see the extent of thyroid compression on the esophagus.  However, we discussed about thyroidectomy, but we have to be careful especially since this can affect her singing.  Also, it has an approximate 25% risk of becoming hypothyroid after hemithyroidectomy.  For now, we would like to avoid surgery for her so we will not proceed with a barium swallow quite yet - I recommended evaluation by ENT for the postnasal drip and also to check her vocal cords -Plan to repeat an ultrasound in 2 years from the previous, around 07/2024  2.  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  I strongly advised her to use gloves.  As of now, she does not touch the cat anymore, if she got a different dispenser for the gel. -We also repeated her TFTs and these were normal.  Previous TFTs were normal in 10/2021. -No further investigation is needed for this  Carlus Pavlov, MD PhD [facility]

## 2023-04-01 NOTE — Patient Instructions (Addendum)
Try to see ENT.  You should have an endocrinology follow-up appointment in 1 year.

## 2023-04-04 ENCOUNTER — Encounter: Payer: Self-pay | Admitting: Gastroenterology

## 2023-04-04 ENCOUNTER — Encounter: Payer: Self-pay | Admitting: Internal Medicine

## 2023-04-05 NOTE — Telephone Encounter (Signed)
Please advise for patient please as Provider is out of office

## 2023-04-08 NOTE — Telephone Encounter (Signed)
Patient has dropped off previous EKG that was done so Dr.Crawford can review it. It will be placed in Dr.Crawford's box up front.

## 2023-04-10 ENCOUNTER — Encounter: Payer: Self-pay | Admitting: Diagnostic Neuroimaging

## 2023-04-10 ENCOUNTER — Other Ambulatory Visit: Payer: Self-pay | Admitting: Neurology

## 2023-04-10 DIAGNOSIS — Z9189 Other specified personal risk factors, not elsewhere classified: Secondary | ICD-10-CM

## 2023-04-10 DIAGNOSIS — H2513 Age-related nuclear cataract, bilateral: Secondary | ICD-10-CM | POA: Diagnosis not present

## 2023-04-10 DIAGNOSIS — G459 Transient cerebral ischemic attack, unspecified: Secondary | ICD-10-CM

## 2023-04-10 DIAGNOSIS — G40909 Epilepsy, unspecified, not intractable, without status epilepticus: Secondary | ICD-10-CM

## 2023-04-17 ENCOUNTER — Encounter: Payer: Self-pay | Admitting: Certified Registered Nurse Anesthetist

## 2023-04-19 ENCOUNTER — Telehealth: Payer: Self-pay | Admitting: Gastroenterology

## 2023-04-19 NOTE — Telephone Encounter (Signed)
Patient needing prep instruction for upcoming procedure. Please advise.

## 2023-04-19 NOTE — Telephone Encounter (Signed)
Instructions have been reviewed with patient.

## 2023-04-20 DIAGNOSIS — R0602 Shortness of breath: Secondary | ICD-10-CM | POA: Diagnosis not present

## 2023-04-22 ENCOUNTER — Ambulatory Visit (AMBULATORY_SURGERY_CENTER): Payer: Medicare Other | Admitting: Gastroenterology

## 2023-04-22 ENCOUNTER — Encounter: Payer: Self-pay | Admitting: Gastroenterology

## 2023-04-22 VITALS — BP 123/71 | HR 80 | Temp 98.6°F | Resp 17 | Ht 65.0 in | Wt 209.0 lb

## 2023-04-22 DIAGNOSIS — R195 Other fecal abnormalities: Secondary | ICD-10-CM

## 2023-04-22 DIAGNOSIS — K253 Acute gastric ulcer without hemorrhage or perforation: Secondary | ICD-10-CM

## 2023-04-22 DIAGNOSIS — K259 Gastric ulcer, unspecified as acute or chronic, without hemorrhage or perforation: Secondary | ICD-10-CM

## 2023-04-22 DIAGNOSIS — D123 Benign neoplasm of transverse colon: Secondary | ICD-10-CM

## 2023-04-22 DIAGNOSIS — K254 Chronic or unspecified gastric ulcer with hemorrhage: Secondary | ICD-10-CM | POA: Diagnosis not present

## 2023-04-22 DIAGNOSIS — K633 Ulcer of intestine: Secondary | ICD-10-CM

## 2023-04-22 DIAGNOSIS — D508 Other iron deficiency anemias: Secondary | ICD-10-CM

## 2023-04-22 MED ORDER — PANTOPRAZOLE SODIUM 40 MG PO TBEC
40.0000 mg | DELAYED_RELEASE_TABLET | Freq: Two times a day (BID) | ORAL | 1 refills | Status: DC
Start: 2023-04-22 — End: 2023-05-14

## 2023-04-22 MED ORDER — SODIUM CHLORIDE 0.9 % IV SOLN: 500.0000 mL | INTRAVENOUS | Status: DC

## 2023-04-22 NOTE — Op Note (Signed)
[facility] Patient Name: Desiree Snow Procedure Date: 04/22/2023 1:43 PM MRN: [id number] Endoscopist: Sherilyn Cooter L. Myrtie Neither , MD, [id number] Age: 72 Referring MD:  Date of Birth: May 30, 1951 Gender: Female Account #: [id number] Procedure:                Colonoscopy Indications:              Heme positive stool, Unexplained iron deficiency                            anemia Medicines:                Monitored Anesthesia Care Procedure:                Pre-Anesthesia Assessment:                           - Prior to the procedure, a History and Physical                            was performed, and patient medications and                            allergies were reviewed. The patient's tolerance of                            previous anesthesia was also reviewed. The risks                            and benefits of the procedure and the sedation                            options and risks were discussed with the patient.                            All questions were answered, and informed consent                            was obtained. Prior Anticoagulants: The patient has                            taken no anticoagulant or antiplatelet agents. ASA                            Grade Assessment: III - A patient with severe                            systemic disease. After reviewing the risks and                            benefits, the patient was deemed in satisfactory                            condition to undergo the procedure.  After obtaining informed consent, the colonoscope                            was passed under direct vision. Throughout the                            procedure, the patient's blood pressure, pulse, and                            oxygen saturations were monitored continuously. The                            Olympus CF-[id number] ([id number]) Colonoscope was                            introduced through the anus and advanced to the  the                            terminal ileum, with identification of the                            appendiceal orifice and IC valve. The colonoscopy                            was performed without difficulty. The patient                            tolerated the procedure well. The quality of the                            bowel preparation was good. The terminal ileum,                            ileocecal valve, appendiceal orifice, and rectum                            were photographed. Scope In: 1:51:41 PM Scope Out: 2:09:07 PM Scope Withdrawal Time: 0 hours 13 minutes 26 seconds  Total Procedure Duration: 0 hours 17 minutes 26 seconds  Findings:                 The perianal and digital rectal examinations were                            normal.                           The terminal ileum appeared normal.                           A single (solitary) shallow four mm ulcer was found                            at the ileocecal valve. No stigmata of recent  bleeding were seen.                           Repeat examination of right colon under NBI                            performed.                           A 4 mm polyp was found in the transverse colon. The                            polyp was sessile. The polyp was removed with a                            cold snare. Resection and retrieval were complete.                           Diverticula were found in the left colon.                           The exam was otherwise without abnormality on                            direct and retroflexion views. Complications:            No immediate complications. Estimated Blood Loss:     Estimated blood loss was minimal. Impression:               - The examined portion of the ileum was normal.                           - A single (solitary) ulcer at the ileocecal valve.                           - One 4 mm polyp in the transverse colon, removed                             with a cold snare. Resected and retrieved.                           - Diverticulosis in the left colon.                           - The examination was otherwise normal on direct                            and retroflexion views. Recommendation:           - Patient has a contact number available for                            emergencies. The signs and symptoms of potential  delayed complications were discussed with the                            patient. Return to normal activities tomorrow.                            Written discharge instructions were provided to the                            patient.                           - Resume previous diet.                           - Continue present medications.                           - Await pathology results.                           - Repeat colonoscopy is recommended for                            surveillance. The colonoscopy date will be                            determined after pathology results from today's                            exam become available for review.                           - See the other procedure note for documentation of                            additional recommendations.                           - Avoid aspirin and NSAIDs Maxie Slovacek L. Myrtie Neither, MD 04/22/2023 2:26:16 PM This report has been signed electronically.

## 2023-04-22 NOTE — Progress Notes (Signed)
1345 Robinul 0.1 mg IV given due large amount of secretions upon assessment.  MD made aware, vss  ?

## 2023-04-22 NOTE — Op Note (Signed)
[facility] Patient Name: Desiree Snow Procedure Date: 04/22/2023 1:32 PM MRN: [id number] Endoscopist: Sherilyn Cooter L. Myrtie Neither , MD, [id number] Age: 72 Referring MD:  Date of Birth: March 20, 1951 Gender: Female Account #: [id number] Procedure:                Upper GI endoscopy Indications:              Unexplained iron deficiency anemia, Heme positive                            stool Medicines:                Monitored Anesthesia Care Procedure:                Pre-Anesthesia Assessment:                           - Prior to the procedure, a History and Physical                            was performed, and patient medications and                            allergies were reviewed. The patient's tolerance of                            previous anesthesia was also reviewed. The risks                            and benefits of the procedure and the sedation                            options and risks were discussed with the patient.                            All questions were answered, and informed consent                            was obtained. Prior Anticoagulants: The patient has                            taken no anticoagulant or antiplatelet agents. ASA                            Grade Assessment: III - A patient with severe                            systemic disease. After reviewing the risks and                            benefits, the patient was deemed in satisfactory                            condition to undergo the procedure.  After obtaining informed consent, the endoscope was                            passed under direct vision. Throughout the                            procedure, the patient's blood pressure, pulse, and                            oxygen saturations were monitored continuously. The                            GIF [id number] was introduced through the                            mouth, and advanced to the second part of  duodenum.                            The upper GI endoscopy was accomplished without                            difficulty. The patient tolerated the procedure                            well. Scope In: Scope Out: Findings:                 Inlet patch proxmial esophagus                           The exam of the esophagus was otherwise normal.                           Few non-bleeding superficial gastric ulcers with no                            stigmata of bleeding were found in the gastric body                            and in the gastric antrum. The largest lesion was 8                            mm in largest dimension. Several biopsies were                            obtained on the greater curvature of the gastric                            body, on the lesser curvature of the gastric body,                            on the greater curvature of the gastric antrum and  on the lesser curvature of the gastric antrum with                            cold forceps for histology.                           The cardia and gastric fundus were normal on                            retroflexion.                           Normal mucosa was found in the entire duodenum.                            Biopsies for histology were taken with a cold                            forceps for evaluation of celiac disease. Complications:            No immediate complications. Estimated Blood Loss:     Estimated blood loss was minimal. Impression:               - Non-bleeding gastric ulcers with no stigmata of                            bleeding.                           - Normal mucosa was found in the entire examined                            duodenum. Biopsied.                           - Several biopsies were obtained on the greater                            curvature of the gastric body, on the lesser                            curvature of the gastric body, on the greater                             curvature of the gastric antrum and on the lesser                            curvature of the gastric antrum. Recommendation:           - Patient has a contact number available for                            emergencies. The signs and symptoms of potential                            delayed  complications were discussed with the                            patient. Return to normal activities tomorrow.                            Written discharge instructions were provided to the                            patient.                           - Resume previous diet.                           - Continue present medications.                           - Await pathology results.                           - See the other procedure note for documentation of                            additional recommendations.                           - Avoid aspirin and NSAIDs                           Pantoprazole 40 mg twice daily before                            breakfast/supper for 8 weeks.                           Disp #60, RF 1                           Follow up to be arranged after Bx results.                           Take a "Blood builder" OTC iron supplement once                            daily for your iron deficiency.                           Have your CBC and iron studies checked with primary                            care in 8-12 weeks Sherilyn Cooter L. Myrtie Neither, MD 04/22/2023 2:34:01 PM This report has been signed electronically.

## 2023-04-22 NOTE — Progress Notes (Signed)
History and Physical:  This patient presents for endoscopic testing for: Encounter Diagnoses  Name Primary?   Heme positive stool Yes   Other iron deficiency anemia     Clinical details in 03/19/23 office consult note. Hgb 12.6 that day, but ferritin low at 7, so EGD /colonoscopy today.  Patient is otherwise without complaints or active issues today.   Past Medical History: Past Medical History:  Diagnosis Date   Allergy    Anxiety    Arthritis    Blood transfusion without reported diagnosis    Cataract    Chicken pox    Colon polyps    Diverticulosis    Family history of uterine cancer 05/05/2021   GERD (gastroesophageal reflux disease)    H/O febrile seizure    as infant, x 1, List of AEDs tried: Dilantin, Mysoline, Tegretol, Gabapentine, Depakote,  Keppra, and Lamictal,   Seizures (HCC)    "resolved" last seizure in 2002   Thyroid goiter      Past Surgical History: Past Surgical History:  Procedure Laterality Date   BREAST BIOPSY Right 07/29/2020   COMPLEX SCLEROSING LESION WITH ATYPICAL DUCTAL   BREAST BIOPSY Right 01/01/2020   COMPLEX SCLEROSING LESION    BREAST EXCISIONAL BIOPSY Right 09/20/2020   complex sclerosing   BREAST EXCISIONAL BIOPSY Right 03/01/2020   Favor IDC, CSL also considered   BREAST EXCISIONAL BIOPSY Left    ? Date  ?lipoma   BREAST LUMPECTOMY WITH RADIOACTIVE SEED LOCALIZATION Right 03/01/2020   Procedure: RIGHT BREAST LUMPECTOMY WITH RADIOACTIVE SEED LOCALIZATION;  Surgeon: Harriette Bouillon, MD;  Location: [facility];  Service: General;  Laterality: Right;   BREAST LUMPECTOMY WITH RADIOACTIVE SEED LOCALIZATION Right 09/20/2020   Procedure: RIGHT BREAST LUMPECTOMY WITH RADIOACTIVE SEED LOCALIZATION;  Surgeon: Harriette Bouillon, MD;  Location: [facility];  Service: General;  Laterality: Right;   KNEE SURGERY  2021   LOBECTOMY FOR SEIZURE FOCUS  1998   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    MENISECTOMY Left    SPLENECTOMY  1961   TONSILLECTOMY AND ADENOIDECTOMY  1963   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   Procedure: TOTAL KNEE ARTHROPLASTY;  Surgeon: Beverely Low, MD;  Location: [facility];  Service: Orthopedics;  Laterality: Left;   TRIGGER FINGER RELEASE Right    middle   WISDOM TOOTH EXTRACTION      Allergies: Allergies  Allergen Reactions   Cetirizine Other (See Comments)    Cognitive issues and "lowers seizure threshold."   Metronidazole Other (See Comments)    Strange feeling and inability to function   Quinolones    Scopolamine Hives     on her fingers no itchng   Penicillins Rash    Outpatient Meds: Current Outpatient Medications  Medication Sig Dispense Refill   Aspirin 81 MG CAPS Take 81 mg by mouth daily.     Calcium Carbonate-Vit D-Min (CALCIUM 1200 PO) Take 1 tablet by mouth daily.     Cholecalciferol (VITAMIN D3) 1000 units CAPS Take 1,000 Units by mouth daily.     CINNAMON PO Take 100 mg by mouth at bedtime.     clorazepate (TRANXENE) 3.75 MG tablet Take 1 tablet (3.75 mg total) by mouth daily as needed for anxiety. TAKE 1 TABLET BY MOUTH EVERY DAY AS NEEDED ANXIETY 30 tablet 3   Collagen-Vitamin C (COLLAGEN PLUS VITAMIN C) 740-125 MG CAPS Take 740 mg by mouth daily.     famotidine (PEPCID) 20 MG tablet Take 20 mg by mouth  daily.      Flaxseed, Linseed, (FLAX SEED OIL PO) Take 1 capsule by mouth daily.     lamoTRIgine (LAMICTAL) 100 MG tablet Take 1 tablet (100 mg total) by mouth 2 (two) times daily. 180 tablet 4   Magnesium 250 MG TABS Take 250 mg by mouth daily.     Multiple Vitamin (MULTIVITAMIN) tablet Take 1 tablet by mouth daily.     TURMERIC PO Take 1 tablet by mouth daily.     UNABLE TO FIND Take 1 capsule by mouth daily. Med Name: Collagen 2 otc once daily     Current Facility-Administered Medications  Medication Dose Route Frequency Provider Last Rate Last Admin   0.9 %  sodium chloride infusion  500 mL Intravenous Continuous  Danis, Starr Lake III, MD          ___________________________________________________________________ Objective   Exam:  BP (!) 112/59   Pulse 91   Temp 98.6 F (37 C) (Temporal)   Ht 5\' 5"  (1.651 m)   Wt 209 lb (94.8 kg)   SpO2 98%   BMI 34.78 kg/m   CV: regular , S1/S2 Resp: clear to auscultation bilaterally, normal RR and effort noted GI: soft, no tenderness, with active bowel sounds.   Assessment: Encounter Diagnoses  Name Primary?   Heme positive stool Yes   Other iron deficiency anemia      Plan: Colonoscopy EGD  The benefits and risks of the planned procedure were described in detail with the patient or (when appropriate) their health care proxy.  Risks were outlined as including, but not limited to, bleeding, infection, perforation, adverse medication reaction leading to cardiac or pulmonary decompensation, pancreatitis (if ERCP).  The limitation of incomplete mucosal visualization was also discussed.  No guarantees or warranties were given.  The patient is appropriate for an endoscopic procedure in the ambulatory setting.   - Amada Jupiter, MD

## 2023-04-22 NOTE — Progress Notes (Signed)
 Called to room to assist during endoscopic procedure.  Patient ID and intended procedure confirmed with present staff. Received instructions for my participation in the procedure from the performing physician.  

## 2023-04-22 NOTE — Patient Instructions (Addendum)
-Handout on polyps, diverticulosis provided -await pathology results -repeat colonoscopy for surveillance recommended. Date to be determined when pathology result become available   -Continue present medications  -Avoid aspirin, ibuprofen, naproxen, or other non-steroidal anti-inflammatory drugs. - Take a "Blood builder" OTC iron supplement once  daily for your iron deficiency. - Have your CBC and iron studies checked with primary care in 8-12 weeks   YOU HAD AN ENDOSCOPIC PROCEDURE TODAY AT THE [facility]:   Refer to the procedure report that was given to you for any specific questions about what was found during the examination.  If the procedure report does not answer your questions, please call your gastroenterologist to clarify.  If you requested that your care partner not be given the details of your procedure findings, then the procedure report has been included in a sealed envelope for you to review at your convenience later.  YOU SHOULD EXPECT: Some feelings of bloating in the abdomen. Passage of more gas than usual.  Walking can help get rid of the air that was put into your GI tract during the procedure and reduce the bloating. If you had a lower endoscopy (such as a colonoscopy or flexible sigmoidoscopy) you may notice spotting of blood in your stool or on the toilet paper. If you underwent a bowel prep for your procedure, you may not have a normal bowel movement for a few days.  Please Note:  You might notice some irritation and congestion in your nose or some drainage.  This is from the oxygen used during your procedure.  There is no need for concern and it should clear up in a day or so.  SYMPTOMS TO REPORT IMMEDIATELY:  Following lower endoscopy (colonoscopy or flexible sigmoidoscopy):  Excessive amounts of blood in the stool  Significant tenderness or worsening of abdominal pains  Swelling of the abdomen that is new, acute  Fever of 100F or higher  Following  upper endoscopy (EGD)  Vomiting of blood or coffee ground material  New chest pain or pain under the shoulder blades  Painful or persistently difficult swallowing  New shortness of breath  Fever of 100F or higher  Black, tarry-looking stools  For urgent or emergent issues, a gastroenterologist can be reached at any hour by calling [phone]. Do not use MyChart messaging for urgent concerns.    DIET:  We do recommend a small meal at first, but then you may proceed to your regular diet.  Drink plenty of fluids but you should avoid alcoholic beverages for 24 hours.  ACTIVITY:  You should plan to take it easy for the rest of today and you should NOT DRIVE or use heavy machinery until tomorrow (because of the sedation medicines used during the test).    FOLLOW UP: Our staff will call the number listed on your records the next business day following your procedure.  We will call around 7:15- 8:00 am to check on you and address any questions or concerns that you may have regarding the information given to you following your procedure. If we do not reach you, we will leave a message.     If any biopsies were taken you will be contacted by phone or by letter within the next 1-3 weeks.  Please call us at [phone] if you have not heard about the biopsies in 3 weeks.    SIGNATURES/CONFIDENTIALITY: You and/or your care partner have signed paperwork which will be entered into your electronic medical record.  These  signatures attest to the fact that that the information above on your After Visit Summary has been reviewed and is understood.  Full responsibility of the confidentiality of this discharge information lies with you and/or your care-partner.

## 2023-04-22 NOTE — Progress Notes (Signed)
 Report given to PACU, vss 

## 2023-04-23 ENCOUNTER — Telehealth: Payer: Self-pay

## 2023-04-23 DIAGNOSIS — G459 Transient cerebral ischemic attack, unspecified: Secondary | ICD-10-CM | POA: Diagnosis not present

## 2023-04-23 NOTE — Telephone Encounter (Signed)
  Follow up Call-     04/22/2023    1:14 PM  Call back number  Post procedure Call Back phone  # [phone]  Permission to leave phone message Yes     Left message

## 2023-04-25 ENCOUNTER — Inpatient Hospital Stay: Payer: Medicare Other | Attending: Nurse Practitioner | Admitting: Nurse Practitioner

## 2023-04-25 ENCOUNTER — Encounter: Payer: Self-pay | Admitting: Nurse Practitioner

## 2023-04-25 ENCOUNTER — Other Ambulatory Visit: Payer: Self-pay | Admitting: Nurse Practitioner

## 2023-04-25 VITALS — BP 137/72 | HR 94 | Temp 97.9°F | Resp 17 | Wt 207.6 lb

## 2023-04-25 DIAGNOSIS — N6091 Unspecified benign mammary dysplasia of right breast: Secondary | ICD-10-CM | POA: Diagnosis not present

## 2023-04-25 DIAGNOSIS — K259 Gastric ulcer, unspecified as acute or chronic, without hemorrhage or perforation: Secondary | ICD-10-CM | POA: Insufficient documentation

## 2023-04-25 DIAGNOSIS — Z1501 Genetic susceptibility to malignant neoplasm of breast: Secondary | ICD-10-CM | POA: Insufficient documentation

## 2023-04-25 DIAGNOSIS — M85851 Other specified disorders of bone density and structure, right thigh: Secondary | ICD-10-CM | POA: Insufficient documentation

## 2023-04-25 DIAGNOSIS — Z8673 Personal history of transient ischemic attack (TIA), and cerebral infarction without residual deficits: Secondary | ICD-10-CM | POA: Diagnosis not present

## 2023-04-25 DIAGNOSIS — E611 Iron deficiency: Secondary | ICD-10-CM | POA: Insufficient documentation

## 2023-04-25 NOTE — Progress Notes (Signed)
Patient Care Team: Myrlene Broker, MD as PCP - General (Internal Medicine) Malachy Mood, MD as Consulting Physician (Hematology) Harriette Bouillon, MD as Consulting Physician (General Surgery) [facility] as Consulting Physician (Radiology)   CHIEF COMPLAINT: Follow up high risk for breast cancer   CURRENT THERAPY: Surveillance and high risk screening program (staggered mammogram/ MRI 6 months apart)  INTERVAL HISTORY Desiree Snow returns for follow up as scheduled. Last seen by Dr. Mosetta Putt 04/23/22.  Screening MRI 06/26/2022 showed stable likely benign left breast mass, stable postsurgical changes in the right breast, no evidence of malignancy in either breast.  A follow-up left breast ultrasound 09/24/2022 showed stability consistent with an area of fat necrosis.  Mammogram/US 12/24/2022 was stable.  She denies concerns in her breast such as new lump/mass, nipple discharge or inversion, or skin change.  She was found to have iron deficiency without anemia, and heme+ stool. Labs 03/2023 showed normal hgb, ferritin 6.9. EGD showed several gastric ulcers, and colonosocopy showed one polyp which was removed. Path is pending. She started PPI and "blood builder" iron.  She does not eat red meat.  PCP will check iron studies in ~8 weeks.   Otherwise she is doing well. Had a TIA in the past year and started baby aspirin. She developed some intermittent shortness of breath and is wearing a cardiac monitor to r/o Afib. She was seen in urgent care recently and told she may have developing PNA. Denis cough, chest pain, fever/chills.  ROS  All other systems reviewed and negative  Past Medical History:  Diagnosis Date   Allergy    Anxiety    Arthritis    Blood transfusion without reported diagnosis    Cataract    Chicken pox    Colon polyps    Diverticulosis    Family history of uterine cancer 05/05/2021   GERD (gastroesophageal reflux disease)    H/O febrile seizure     as infant, x 1, List of AEDs tried: Dilantin, Mysoline, Tegretol, Gabapentine, Depakote,  Keppra, and Lamictal,   Seizures (HCC)    "resolved" last seizure in 2002   Thyroid goiter      Past Surgical History:  Procedure Laterality Date   BREAST BIOPSY Right 07/29/2020   COMPLEX SCLEROSING LESION WITH ATYPICAL DUCTAL   BREAST BIOPSY Right 01/01/2020   COMPLEX SCLEROSING LESION    BREAST EXCISIONAL BIOPSY Right 09/20/2020   complex sclerosing   BREAST EXCISIONAL BIOPSY Right 03/01/2020   Favor IDC, CSL also considered   BREAST EXCISIONAL BIOPSY Left    ? Date  ?lipoma   BREAST LUMPECTOMY WITH RADIOACTIVE SEED LOCALIZATION Right 03/01/2020   Procedure: RIGHT BREAST LUMPECTOMY WITH RADIOACTIVE SEED LOCALIZATION;  Surgeon: Harriette Bouillon, MD;  Location: [facility];  Service: General;  Laterality: Right;   BREAST LUMPECTOMY WITH RADIOACTIVE SEED LOCALIZATION Right 09/20/2020   Procedure: RIGHT BREAST LUMPECTOMY WITH RADIOACTIVE SEED LOCALIZATION;  Surgeon: Harriette Bouillon, MD;  Location: [facility];  Service: General;  Laterality: Right;   KNEE SURGERY  2021   LOBECTOMY FOR SEIZURE FOCUS  1998   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   MENISECTOMY Left    SPLENECTOMY  1961   TONSILLECTOMY AND ADENOIDECTOMY  1963   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   Procedure: TOTAL KNEE ARTHROPLASTY;  Surgeon: Beverely Low, MD;  Location: [facility];  Service: Orthopedics;  Laterality: Left;   TRIGGER FINGER RELEASE Right    middle   WISDOM  TOOTH EXTRACTION       Outpatient Encounter Medications as of 04/25/2023  Medication Sig   Aspirin 81 MG CAPS Take 81 mg by mouth daily.   Calcium Carbonate-Vit D-Min (CALCIUM 1200 PO) Take 1 tablet by mouth daily.   Cholecalciferol (VITAMIN D3) 1000 units CAPS Take 1,000 Units by mouth daily.   CINNAMON PO Take 100 mg by mouth at bedtime.   clorazepate (TRANXENE) 3.75 MG tablet Take 1 tablet (3.75 mg  total) by mouth daily as needed for anxiety. TAKE 1 TABLET BY MOUTH EVERY DAY AS NEEDED ANXIETY   Collagen-Vitamin C (COLLAGEN PLUS VITAMIN C) 740-125 MG CAPS Take 740 mg by mouth daily.   famotidine (PEPCID) 20 MG tablet Take 20 mg by mouth daily.    Flaxseed, Linseed, (FLAX SEED OIL PO) Take 1 capsule by mouth daily.   lamoTRIgine (LAMICTAL) 100 MG tablet Take 1 tablet (100 mg total) by mouth 2 (two) times daily.   Magnesium 250 MG TABS Take 250 mg by mouth daily.   Multiple Vitamin (MULTIVITAMIN) tablet Take 1 tablet by mouth daily.   pantoprazole (PROTONIX) 40 MG tablet Take 1 tablet (40 mg total) by mouth 2 (two) times daily. Before breakfast/supper for 8 weeks   TURMERIC PO Take 1 tablet by mouth daily.   UNABLE TO FIND Take 1 capsule by mouth daily. Med Name: Collagen 2 otc once daily   No facility-administered encounter medications on file as of 04/25/2023.     Today's Vitals   04/25/23 1039  BP: 137/72  Pulse: 94  Resp: 17  Temp: 97.9 F (36.6 C)  TempSrc: Temporal  SpO2: 96%  Weight: 207 lb 9.6 oz (94.2 kg)   Body mass index is 34.55 kg/m.   PHYSICAL EXAM GENERAL:alert, no distress and comfortable SKIN: no rash  EYES: sclera clear NECK: without mass LYMPH:  no palpable cervical or supraclavicular lymphadenopathy  LUNGS: clear with normal breathing effort HEART: regular rate & rhythm, no lower extremity edema ABDOMEN: abdomen soft, non-tender and normal bowel sounds NEURO: alert & oriented x 3 with fluent speech, no focal motor/sensory deficits Breast exam: No nipple discharge or inversion.  S/p right lumpectomy, incisions completely healed.  Mild areolar retraction from scar tissue.  No palpable mass or nodularity in either breast or axilla that I could appreciate.   CBC    Component Value Date/Time   WBC 9.1 03/19/2023 1057   RBC 4.29 03/19/2023 1057   HGB 12.6 03/19/2023 1057   HGB 13.2 04/23/2022 1229   HCT 39.4 03/19/2023 1057   PLT 312.0 03/19/2023 1057    PLT 333 04/23/2022 1229   MCV 91.9 03/19/2023 1057   MCH 31.1 04/23/2022 1229   MCHC 32.0 03/19/2023 1057   RDW 13.8 03/19/2023 1057   LYMPHSABS 2.5 03/19/2023 1057   MONOABS 1.0 03/19/2023 1057   EOSABS 0.9 (H) 03/19/2023 1057   BASOSABS 0.1 03/19/2023 1057     CMP     Component Value Date/Time   NA 139 04/23/2022 1229   NA 140 11/22/2016 0000   K 4.2 04/23/2022 1229   CL 105 04/23/2022 1229   CO2 29 04/23/2022 1229   GLUCOSE 116 (H) 04/23/2022 1229   BUN 18 04/23/2022 1229   BUN 16 11/22/2016 0000   CREATININE 0.99 04/23/2022 1229   CALCIUM 9.3 04/23/2022 1229   PROT 6.9 04/23/2022 1229   ALBUMIN 4.1 04/23/2022 1229   AST 15 04/23/2022 1229   ALT 12 04/23/2022 1229   ALKPHOS 70 04/23/2022  1229   BILITOT 0.4 04/23/2022 1229   GFRNONAA >60 04/23/2022 1229   GFRAA >60 02/26/2020 1200     ASSESSMENT & PLAN:Desiree Snow is a 72 y.o. post-menopausal female with    1. 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 -She tried prophylactic AI (anastrozole 10/2020, switched to exemestane 05/2021) but did not tolerate well and stopped 06/2022 due to SE's. She tried Raloxifene 1/23 - 6/23 but stopped due to possible TIA.  -She continues high risk screening program, MRI 06/26/2022 showed stable likely benign left breast mass, stable postsurgical changes in the right breast, no evidence of malignancy in either breast.  A follow-up left breast ultrasound 09/24/2022 showed stability consistent with an area of fat necrosis.   -Ms. Embry is clinically doing well.  Breast exam is benign, recent mammogram/US 12/24/2022 was stable.  No clinical concern for malignancy -Continue screening/surveillance, next MRI 06/2023 and mammogram 12/2023, we will follow-up with results -Continue annual follow-up   2.  Iron deficiency without anemia  -Found to have iron deficiency without  anemia, and heme+ stool -Labs 03/2023 normal hgb, ferritin 6.9 -S/p EGD 04/22/2023 showed several gastric ulcers, and colonosocopy showed one polyp which was removed. Path is pending.  -She started PPI and "blood builder" iron.   -She is on aspirin, no other AC. Denies bleeding. She does not eat red meat. -PCP to check iron studies in ~8 weeks  3.  Intermittent dyspnea -Currently being worked up for A-fib, wearing cardiac monitor -exam with normal rate/rhythm  4. Osteopenia -Her 01/31/18 DEXA showed osteopenia with lowest T-score -1.2 in right femur neck.  -She did not tolerate raloxifene -Takes calcium and vitamin D   5. Cancer screening: History of smoking, family history of uterine cancer -She quit smoking in 2004. She was previously followed with screening chest CT, completed 15 years of follow up in 2019. -reports an aunt with uterine cancer. She requested referral to genetics and underwent counseling and testing on 05/04/21. Results were negative, with VUS in APC. -Continue age-appropriate healthcare, recently established with new PCP, follow-up 7/22 as scheduled    PLAN: -Last breast MRI and mammo/US, GI notes, endoscopy, and labs reviewed -Continue breast cancer surveillance/screening; next MRI 06/2023 and b/l mammo / L [place] in 12/2023 - ordered today -Tx plan for IDA per GI/PCP -Cardiac monitor to r/o Afib, per neuro -F/up here in 1 year, or sooner if needed  Orders Placed This Encounter  Procedures   MR BREAST BILATERAL W WO CONTRAST INC CAD    Standing Status:   Future    Standing Expiration Date:   04/24/2024    Scheduling Instructions:     wearing external cardiac monitor this month (july)    Order Specific Question:   If indicated for the ordered procedure, I authorize the administration of contrast media per Radiology protocol    Answer:   Yes    Order Specific Question:   What is the patient's sedation requirement?    Answer:   No Sedation    Order Specific Question:    Does the patient have a pacemaker or implanted devices?    Answer:   No    Order Specific Question:   Preferred imaging location?    Answer:   [facility] (table limit-550lbs)   MM DIAG BREAST TOMO BILATERAL    Standing Status:   Future    Standing Expiration Date:  04/24/2024    Order Specific Question:   Reason for Exam (SYMPTOM  OR DIAGNOSIS REQUIRED)    Answer:   h/o R ADH, likely benign L breast mass    Order Specific Question:   Preferred imaging location?    [facility]   [place] LIMITED ULTRASOUND INCLUDING AXILLA LEFT BREAST     Standing Status:   Future    Standing Expiration Date:   04/24/2024    Order Specific Question:   Reason for Exam (SYMPTOM  OR DIAGNOSIS REQUIRED)    Answer:   h/o R ADH, likely benign L breast mass    Order Specific Question:   Preferred imaging location?    Answer:   [facility]      All questions were answered. The patient knows to call the clinic with any problems, questions or concerns. No barriers to learning were detected. I spent 20 minutes counseling the patient face to face. The total time spent in the appointment was 30 minutes and more than 50% was on counseling, review of test results, and coordination of care.   Santiago Glad, NP-C 04/25/2023

## 2023-04-26 ENCOUNTER — Encounter: Payer: Self-pay | Admitting: Nurse Practitioner

## 2023-04-29 ENCOUNTER — Encounter: Payer: Self-pay | Admitting: Internal Medicine

## 2023-04-29 ENCOUNTER — Ambulatory Visit (INDEPENDENT_AMBULATORY_CARE_PROVIDER_SITE_OTHER): Payer: Medicare Other | Admitting: Internal Medicine

## 2023-04-29 VITALS — BP 110/80 | HR 85 | Temp 98.3°F | Ht 65.0 in | Wt 204.0 lb

## 2023-04-29 DIAGNOSIS — J189 Pneumonia, unspecified organism: Secondary | ICD-10-CM | POA: Diagnosis not present

## 2023-04-29 DIAGNOSIS — R0609 Other forms of dyspnea: Secondary | ICD-10-CM | POA: Diagnosis not present

## 2023-04-29 MED ORDER — DOXYCYCLINE HYCLATE 100 MG PO TABS: 100.0000 mg | ORAL_TABLET | Freq: Two times a day (BID) | ORAL | 0 refills | Status: DC

## 2023-04-29 NOTE — Patient Instructions (Signed)
We have sent in doxycycline to take 1 pill twice a day for 1 week.  We will repeat a chest x-ray to check the lungs when you return.

## 2023-04-29 NOTE — Progress Notes (Unsigned)
   Subjective:   Patient ID: Desiree Snow, female    DOB: 08/16/1951, 72 y.o.   MRN: [id number]  HPI Urgent care  Review of Systems  Objective:  Physical Exam  Vitals:   04/29/23 1049  BP: 110/80  Pulse: 85  Temp: 98.3 F (36.8 C)  TempSrc: Oral  SpO2: 93%  Weight: 204 lb (92.5 kg)  Height: 5\' 5"  (1.651 m)    Assessment & Plan:

## 2023-05-01 ENCOUNTER — Encounter: Payer: Self-pay | Admitting: Internal Medicine

## 2023-05-01 DIAGNOSIS — J189 Pneumonia, unspecified organism: Secondary | ICD-10-CM | POA: Insufficient documentation

## 2023-05-01 NOTE — Assessment & Plan Note (Signed)
Patient states told haziness in right lung possible developing pneumonia not treated at urgent care. Will attempt to get records. Rx doxycyline 1 week. Repeat CXR 3-4 weeks after treatment for clearance.

## 2023-05-01 NOTE — Assessment & Plan Note (Signed)
Discussed EKG and baseline EKG and no significant abnormalities or changes. Her SOB is chronic and stable. Asked her to start graded exercise and follow up 1-2 months for efficacy.

## 2023-05-02 ENCOUNTER — Encounter: Payer: Self-pay | Admitting: Gastroenterology

## 2023-05-06 ENCOUNTER — Telehealth: Payer: Self-pay | Admitting: Diagnostic Neuroimaging

## 2023-05-06 ENCOUNTER — Telehealth: Payer: Self-pay | Admitting: Cardiology

## 2023-05-06 ENCOUNTER — Telehealth: Payer: Self-pay

## 2023-05-06 NOTE — Telephone Encounter (Signed)
Received call from [place] with [employer] reporting abnormal EKG from the weekend.  Patient reported dizziness with light activity. Monitor showed new onset atrial flutter at 156 bpm. Yesterday showed NSR 97 bpm.  Patient has already been contacted, see other phone note from today for details.

## 2023-05-06 NOTE — Telephone Encounter (Signed)
Called the patient back and since Dr Marjory Lies is the ordering physician she is asking if he was notified by the cardiac monitor people of an alert. Advised that typically the notifications go to the cardiologist. She reiterated that as ordering MD, Dr Marjory Lies should also be made aware. I advised that yes he ordered the 30 day monitor but usually the report comes to Dr Marjory Lies after the 30 days have been completed with the complete result/report. She states the cardiologist wasn't alerted until they came in today of an event that occurred on Friday. Advised that I don't see anything in Dr Fonda Kinder basket indicating someone reached out and there is no fax providing an update. In my experience though, its not til the report is finalized that Dr Marjory Lies will receive the notes and results. Advised if something did come through I will send her a mychart message informing her.

## 2023-05-06 NOTE — Telephone Encounter (Signed)
[employer] is calling to report abnormal heart monitor on patient

## 2023-05-06 NOTE — Telephone Encounter (Signed)
   Cardiac Monitor Alert  Date of alert:  05/06/2023   Patient Name: Desiree Snow  DOB: Oct 06, 1951  MRN: [id number]   [facility] Cardiologist: None  [facility] EP:  None    Monitor Information: Cardiac Event Monitor [[employer]]  Reason:  TIA Ordering provider:  Dr. Marjory Lies   Alert Atrial Fibrillation/Flutter This is the 1st alert for this rhythm.  The patient has no hx of Atrial Fibrillation/Flutter.  The patient is not currently on anticoagulation.  Next Cardiology Appointment   Date:  05/10/23  Provider:  Dr. Anne Fu  The patient was contacted today.  She is symptomatic.  She reports the following symptoms:  dizziness, SOB, and palpitations. Arrhythmia, symptoms and history reviewed with Dr. Clifton James.  Plan:  See DOD this week. Will see Dr. Anne Fu as new patient on Friday, since she has not been seen at our office in the past.    Other: Patient requested that her PCP be notified. Will send to Dr. Okey Dupre.  Ethelda Chick, RN  05/06/2023 9:34 AM

## 2023-05-06 NOTE — Telephone Encounter (Signed)
Pt asking if Dr Marjory Lies been notified by [employer] on Friday. Would like a call from the nurse.

## 2023-05-07 ENCOUNTER — Encounter: Payer: Self-pay | Admitting: Cardiology

## 2023-05-10 ENCOUNTER — Ambulatory Visit: Payer: Medicare Other | Attending: Cardiology | Admitting: Cardiology

## 2023-05-10 ENCOUNTER — Encounter: Payer: Self-pay | Admitting: Cardiology

## 2023-05-10 VITALS — BP 118/72 | HR 75 | Ht 65.0 in | Wt 207.0 lb

## 2023-05-10 DIAGNOSIS — Z7901 Long term (current) use of anticoagulants: Secondary | ICD-10-CM | POA: Diagnosis not present

## 2023-05-10 DIAGNOSIS — I480 Paroxysmal atrial fibrillation: Secondary | ICD-10-CM

## 2023-05-10 DIAGNOSIS — G459 Transient cerebral ischemic attack, unspecified: Secondary | ICD-10-CM

## 2023-05-10 MED ORDER — APIXABAN 5 MG PO TABS: 5.0000 mg | ORAL_TABLET | Freq: Two times a day (BID) | ORAL | 0 refills | Status: DC

## 2023-05-10 MED ORDER — DILTIAZEM HCL 30 MG PO TABS: 30.0000 mg | ORAL_TABLET | Freq: Four times a day (QID) | ORAL | 3 refills | Status: DC | PRN

## 2023-05-10 MED ORDER — APIXABAN 5 MG PO TABS: 5.0000 mg | ORAL_TABLET | Freq: Two times a day (BID) | ORAL | 6 refills | Status: DC

## 2023-05-10 NOTE — Patient Instructions (Signed)
Medication Instructions:  Please discontinue your Aspirin. Start Eliquis 5 mg twice a day. You may take Diltiazem 30 mg once every 6 hours as needed for palpitations/rapid heart beat. Continue all other medications as listed.  *If you need a refill on your cardiac medications before your next appointment, please call your pharmacy*  Lab Work: None today If you have labs (blood work) drawn today and your tests are completely normal, you will receive your results only by: MyChart Message (if you have MyChart) OR A paper copy in the mail If you have any lab test that is abnormal or we need to change your treatment, we will call you to review the results.  Testing/Procedures: Your physician has requested that you have an echocardiogram. Echocardiography is a painless test that uses sound waves to create images of your heart. It provides your doctor with information about the size and shape of your heart and how well your heart's chambers and valves are working. This procedure takes approximately one hour. There are no restrictions for this procedure. Please do NOT wear cologne, perfume, aftershave, or lotions (deodorant is allowed). Please arrive 15 minutes prior to your appointment time.  Follow-Up: At [facility], you and your health needs are our priority.  As part of our continuing mission to provide you with exceptional heart care, we have created designated Provider Care Teams.  These Care Teams include your primary Cardiologist (physician) and Advanced Practice Providers (APPs -  Physician Assistants and Nurse Practitioners) who all work together to provide you with the care you need, when you need it.  We recommend signing up for the patient portal called "MyChart".  Sign up information is provided on this After Visit Summary.  MyChart is used to connect with patients for Virtual Visits (Telemedicine).  Patients are able to view lab/test results, encounter notes, upcoming  appointments, etc.  Non-urgent messages can be sent to your provider as well.   To learn more about what you can do with MyChart, go to [email or web address].    Your next appointment:   6 month(s)  Provider:   Jari Favre, PA-C, Robin Searing, NP, Jacolyn Reedy, PA-C, Eligha Bridegroom, NP, Tereso Newcomer, PA-C, or Perlie Gold, PA-C

## 2023-05-10 NOTE — Progress Notes (Signed)
Cardiology Office Note:    Date:  05/10/2023   ID:  Desiree Snow, DOB 03-09-1951, MRN [id number]  PCP:  Myrlene Broker, MD    [facility] Providers Cardiologist:  Donato Schultz, MD     Referring MD: Myrlene Broker, *    History of Present Illness:    Desiree Snow is a 72 y.o. female here for evaluation of newly discovered atrial fibrillation at the request of Dr. Hillard Danker.  In review of medical records, had TIA in the past year and was started on baby aspirin.  Neurology, Dr. Marjory Lies ordered the monitor to look for any signs of atrial fibrillation.  Had MRI and MRA.  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  Has seen oncology for right breast atypical ductal hyperplasia in March or 2021 as well as October 2021 status post lumpectomy x 2.  She stopped her Reloxifine in June 2023 but stopped it because of possible TIA.  Had intermittent dizziness and dyspnea monitor was ordered.  This detected atrial fibrillation.  Her time in atrial fibrillation was 15 hours and 58 minutes with a high heart rate of 171 low heart rate of 72.  Her average heart rate was 123.  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  Past Medical History:  Diagnosis Date   Allergy    Anxiety    Arthritis    Blood transfusion without reported diagnosis    Cataract    Chicken pox    Colon polyps    Diverticulosis    Family history of uterine cancer 05/05/2021   GERD (gastroesophageal reflux disease)    H/O febrile seizure    as infant, x 1, List of AEDs tried: Dilantin, Mysoline, Tegretol, Gabapentine, Depakote,  Keppra, and Lamictal,   Seizures (HCC)    "resolved" last seizure in 2002   Thyroid goiter     Past Surgical History:  Procedure Laterality Date   BREAST BIOPSY Right 07/29/2020   COMPLEX SCLEROSING LESION WITH ATYPICAL DUCTAL   BREAST BIOPSY Right 01/01/2020    COMPLEX SCLEROSING LESION    BREAST EXCISIONAL BIOPSY Right 09/20/2020   complex sclerosing   BREAST EXCISIONAL BIOPSY Right 03/01/2020   Favor IDC, CSL also considered   BREAST EXCISIONAL BIOPSY Left    ? Date  ?lipoma   BREAST LUMPECTOMY WITH RADIOACTIVE SEED LOCALIZATION Right 03/01/2020   Procedure: RIGHT BREAST LUMPECTOMY WITH RADIOACTIVE SEED LOCALIZATION;  Surgeon: Harriette Bouillon, MD;  Location: [facility];  Service: General;  Laterality: Right;   BREAST LUMPECTOMY WITH RADIOACTIVE SEED LOCALIZATION Right 09/20/2020   Procedure: RIGHT BREAST LUMPECTOMY WITH RADIOACTIVE SEED LOCALIZATION;  Surgeon: Harriette Bouillon, MD;  Location: [facility];  Service: General;  Laterality: Right;   KNEE SURGERY  2021   LOBECTOMY FOR SEIZURE FOCUS  1998   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   MENISECTOMY Left    SPLENECTOMY  1961   TONSILLECTOMY AND ADENOIDECTOMY  1963   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   Procedure: TOTAL KNEE ARTHROPLASTY;  Surgeon: Beverely Low, MD;  Location: [facility];  Service: Orthopedics;  Laterality: Left;   TRIGGER FINGER RELEASE Right    middle   WISDOM TOOTH EXTRACTION      Current Medications: Current Meds  Medication Sig   apixaban (ELIQUIS) 5 MG TABS tablet Take 1 tablet (5 mg total) by mouth 2 (two) times daily.  apixaban (ELIQUIS) 5 MG TABS tablet Take 1 tablet (5 mg total) by mouth 2 (two) times daily.   Calcium Carbonate-Vit D-Min (CALCIUM 1200 PO) Take 1 tablet by mouth daily.   Cholecalciferol (VITAMIN D3) 1000 units CAPS Take 1,000 Units by mouth daily.   CINNAMON PO Take 100 mg by mouth at bedtime.   clorazepate (TRANXENE) 3.75 MG tablet Take 1 tablet (3.75 mg total) by mouth daily as needed for anxiety. TAKE 1 TABLET BY MOUTH EVERY DAY AS NEEDED ANXIETY   Collagen-Vitamin C (COLLAGEN PLUS VITAMIN C) 740-125 MG CAPS Take 740 mg by mouth daily.   diltiazem (CARDIZEM) 30 MG tablet Take 1 tablet  (30 mg total) by mouth every 6 (six) hours as needed.   famotidine (PEPCID) 20 MG tablet Take 20 mg by mouth daily.    ferrous sulfate 325 (65 FE) MG tablet Take 325 mg by mouth daily with breakfast.   Flaxseed, Linseed, (FLAX SEED OIL PO) Take 1 capsule by mouth daily.   lamoTRIgine (LAMICTAL) 100 MG tablet Take 1 tablet (100 mg total) by mouth 2 (two) times daily.   Magnesium 250 MG TABS Take 250 mg by mouth daily.   Multiple Vitamin (MULTIVITAMIN) tablet Take 1 tablet by mouth daily.   pantoprazole (PROTONIX) 40 MG tablet Take 1 tablet (40 mg total) by mouth 2 (two) times daily. Before breakfast/supper for 8 weeks   TURMERIC PO Take 1 tablet by mouth daily.   [DISCONTINUED] Aspirin 81 MG CAPS Take 81 mg by mouth daily.     Allergies:   Cetirizine, Metronidazole, Quinolones, Scopolamine, and Penicillins   Social History   Socioeconomic History   Marital status: Single    Spouse name: Not on file   Number of children: 0   Years of education: Not on file   Highest education level: Master's degree (e.g., MA, MS, MEng, MEd, MSW, MBA)  Occupational History   Occupation: NP     Comment: NA  Tobacco Use   Smoking status: Former    Current packs/day: 0.00    Average packs/day: 2.0 packs/day for 29.0 years (58.0 ttl pk-yrs)    Types: Cigarettes    Start date: 10/08/1973    Quit date: 10/08/2002    Years since quitting: 20.6   Smokeless tobacco: Never  Vaping Use   Vaping status: Never Used  Substance and Sexual Activity   Alcohol use: No   Drug use: No   Sexual activity: Not on file  Other Topics Concern   Not on file  Social History Narrative   Lives alone   Caffeine- 2 c coffee   Social Determinants of Health   Financial Resource Strain: Low Risk  (04/27/2023)   Overall Financial Resource Strain (CARDIA)    Difficulty of Paying Living Expenses: Not hard at all  Food Insecurity: No Food Insecurity (04/27/2023)   Hunger Vital Sign    Worried About Running Out of Food in the  Last Year: Never true    Ran Out of Food in the Last Year: Never true  Transportation Needs: No Transportation Needs (04/27/2023)   PRAPARE - Administrator, Civil Service (Medical): No    Lack of Transportation (Non-Medical): No  Physical Activity: Insufficiently Active (04/27/2023)   Exercise Vital Sign    Days of Exercise per Week: 1 day    Minutes of Exercise per Session: 30 min  Stress: No Stress Concern Present (04/27/2023)   [employer] of Occupational Health - Occupational Stress Questionnaire  Feeling of Stress : Only a little  Social Connections: Socially Isolated (04/27/2023)   Social Connection and Isolation Panel [NHANES]    Frequency of Communication with Friends and Family: Never    Frequency of Social Gatherings with Friends and Family: Never    Attends Religious Services: Never    Database administrator or Organizations: Yes    Attends Engineer, structural: More than 4 times per year    Marital Status: Never married     Family History: The patient's family history includes Clotting disorder in her paternal grandmother; Dementia in her father and paternal grandfather; Diabetes in her maternal uncle; Hypertension in her maternal grandmother; Lung cancer in her cousin; Pneumonia in her mother; Stroke in her maternal grandmother; Uterine cancer in her maternal aunt. There is no history of Goiter, Thyroid cancer, Thyroid nodules, Thyroid disease, Colon cancer, Esophageal cancer, Rectal cancer, or Stomach cancer.  ROS:   Please see the history of present illness.    No fevers chills nausea vomiting syncope bleeding all other systems reviewed and are negative.  EKGs/Labs/Other Studies Reviewed:    The following studies were reviewed today: Echo pending  EKG:  EKG Interpretation Date/Time:  Friday May 10 2023 09:07:34 EDT Ventricular Rate:  76 PR Interval:  156 QRS Duration:  74 QT Interval:  360 QTC Calculation: 405 R Axis:   52  Text  Interpretation: Normal sinus rhythm Poor R wave progression No previous ECGs available Confirmed by Donato Schultz ([id number]) on 05/10/2023 9:34:46 AM    Recent Labs: 09/25/2022: TSH 0.87 03/19/2023: Hemoglobin 12.6; Platelets 312.0  Recent Lipid Panel    Component Value Date/Time   CHOL 187 12/25/2017 1139   TRIG 158.0 (H) 12/25/2017 1139   HDL 50.90 12/25/2017 1139   CHOLHDL 4 12/25/2017 1139   VLDL 31.6 12/25/2017 1139   LDLCALC 104 (H) 12/25/2017 1139     Risk Assessment/Calculations:    CHA2DS2-VASc Score = 4   This indicates a 4.8% annual risk of stroke. The patient's score is based upon: CHF History: 0 HTN History: 0 Diabetes History: 0 Stroke History: 2 Vascular Disease History: 0 Age Score: 1 Gender Score: 1             Physical Exam:    VS:  BP 118/72   Pulse 75   Ht 5\' 5"  (1.651 m)   Wt 207 lb (93.9 kg)   SpO2 98%   BMI 34.45 kg/m     Wt Readings from Last 3 Encounters:  05/10/23 207 lb (93.9 kg)  04/29/23 204 lb (92.5 kg)  04/25/23 207 lb 9.6 oz (94.2 kg)     GEN:  Well nourished, well developed in no acute distress HEENT: Normal NECK: No JVD; No carotid bruits LYMPHATICS: No lymphadenopathy CARDIAC: RRR, no murmurs, rubs, gallops RESPIRATORY:  Clear to auscultation without rales, wheezing or rhonchi  ABDOMEN: Soft, non-tender, non-distended MUSCULOSKELETAL:  No edema; No deformity  SKIN: Warm and dry NEUROLOGIC:  Alert and oriented x 3 PSYCHIATRIC:  Normal affect   ASSESSMENT:    1. Paroxysmal atrial fibrillation (HCC)   2. Chronic anticoagulation   3. TIA (transient ischemic attack)    PLAN:    In order of problems listed above:  Paroxysmal atrial fibrillation -Seen on event monitor strip 04/23/2023.  Rapid ventricular response in the 150s.  Felt some dizziness with light activity.  Normal sinus rhythm detected thereafter. -We will give her diltiazem 30 mg as needed -Recommended potential Kardia device or Apple  Watch. -Check  echocardiogram to ensure proper structure and function of heart.  She does have some symptoms of shortness of breath. -She believes that dizziness may be her atrial fibrillation symptom.  Chronic anticoagulation - Starting Eliquis 5 mg twice a day.  Watch for any signs of bleeding.  TIA - Prior TIA as above.  MRI negative.  Former smoker - Quit approximately 20 years ago.  Excellent.  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  Quit for good.  Okay to continue with exercise, weight loss.           Medication Adjustments/Labs and Tests Ordered: Current medicines are reviewed at length with the patient today.  Concerns regarding medicines are outlined above.  Orders Placed This Encounter  Procedures   EKG 12-Lead   ECHOCARDIOGRAM COMPLETE   Meds ordered this encounter  Medications   diltiazem (CARDIZEM) 30 MG tablet    Sig: Take 1 tablet (30 mg total) by mouth every 6 (six) hours as needed.    Dispense:  30 tablet    Refill:  3   apixaban (ELIQUIS) 5 MG TABS tablet    Sig: Take 1 tablet (5 mg total) by mouth 2 (two) times daily.    Dispense:  60 tablet    Refill:  6   apixaban (ELIQUIS) 5 MG TABS tablet    Sig: Take 1 tablet (5 mg total) by mouth 2 (two) times daily.    Dispense:  28 tablet    Refill:  0    Order Specific Question:   Lot Number?    Answer:   Lot [id number]    Order Specific Question:   Expiration Date?    Answer:   07/07/2024    Patient Instructions  Medication Instructions:  Please discontinue your Aspirin. Start Eliquis 5 mg twice a day. You may take Diltiazem 30 mg once every 6 hours as needed for palpitations/rapid heart beat. Continue all other medications as listed.  *If you need a refill on your cardiac medications before your next appointment, please call your pharmacy*  Lab Work: None today If you have labs (blood work) drawn today and your tests are completely normal, you will receive your results only by: MyChart  Message (if you have MyChart) OR A paper copy in the mail If you have any lab test that is abnormal or we need to change your treatment, we will call you to review the results.  Testing/Procedures: Your physician has requested that you have an echocardiogram. Echocardiography is a painless test that uses sound waves to create images of your heart. It provides your doctor with information about the size and shape of your heart and how well your heart's chambers and valves are working. This procedure takes approximately one hour. There are no restrictions for this procedure. Please do NOT wear cologne, perfume, aftershave, or lotions (deodorant is allowed). Please arrive 15 minutes prior to your appointment time.  Follow-Up: At [facility], you and your health needs are our priority.  As part of our continuing mission to provide you with exceptional heart care, we have created designated Provider Care Teams.  These Care Teams include your primary Cardiologist (physician) and Advanced Practice Providers (APPs -  Physician Assistants and Nurse Practitioners) who all work together to provide you with the care you need, when you need it.  We recommend signing up for the patient portal called "MyChart".  Sign up information is provided on this After Visit Summary.  MyChart  is used to connect with patients for Virtual Visits (Telemedicine).  Patients are able to view lab/test results, encounter notes, upcoming appointments, etc.  Non-urgent messages can be sent to your provider as well.   To learn more about what you can do with MyChart, go to [email or web address].    Your next appointment:   6 month(s)  Provider:   Jari Favre, PA-C, Robin Searing, NP, Jacolyn Reedy, PA-C, Eligha Bridegroom, NP, Tereso Newcomer, PA-C, or Perlie Gold, PA-C            Signed, Donato Schultz, MD  05/10/2023 3:06 PM    [facility]

## 2023-05-13 ENCOUNTER — Ambulatory Visit: Payer: Medicare Other | Admitting: Internal Medicine

## 2023-05-14 ENCOUNTER — Ambulatory Visit (HOSPITAL_COMMUNITY): Payer: Medicare Other | Attending: Cardiology

## 2023-05-14 ENCOUNTER — Encounter: Payer: Self-pay | Admitting: Cardiology

## 2023-05-14 ENCOUNTER — Other Ambulatory Visit: Payer: Self-pay | Admitting: Gastroenterology

## 2023-05-14 DIAGNOSIS — I480 Paroxysmal atrial fibrillation: Secondary | ICD-10-CM | POA: Diagnosis not present

## 2023-05-14 DIAGNOSIS — K253 Acute gastric ulcer without hemorrhage or perforation: Secondary | ICD-10-CM

## 2023-05-14 LAB — ECHOCARDIOGRAM COMPLETE
Area-P 1/2: 4.21 cm2
S' Lateral: 3.03 cm

## 2023-05-14 NOTE — Telephone Encounter (Signed)
To summarize discussion after procedure and in result note you conveyed to her:  Heme positive stool and anemia from ulcers found on EGD and colonoscopy. This is why I recommended discontinuation of aspirin and NSAIDs, acid suppression as prescribed and follow-up of blood counts with primary care as outlined in my reports.  I am not certain what other upper GI tract abnormalities endocrinologist was expecting we may encounter in a patient with a goiter, but there were no other abnormalities other than that outlined in my EGD report.  Recall colonoscopy 5 years, which I had just conveyed to my [facility] nurse in a separate note.  HD

## 2023-05-15 ENCOUNTER — Telehealth: Payer: Self-pay | Admitting: *Deleted

## 2023-05-15 NOTE — Telephone Encounter (Signed)
That is correct, 5-year colonoscopy recall.  The patient was notified in a separate portal message.  Thank you  HD

## 2023-05-15 NOTE — Telephone Encounter (Signed)
5-year colonoscopy recall.  Please do me a favor and send me a telephone note about that so my response can also be documented in the chart.  Thank you  HD       Previous Messages    ----- Message ----- From: Inocente Salles, RN Sent: 05/07/2023  10:32 AM EDT To: Sherrilyn Rist, MD  Dr. Myrtie Neither,  Do you want a recall put in for this pt?  Thanks, [employer]

## 2023-05-17 ENCOUNTER — Telehealth: Payer: Self-pay | Admitting: Internal Medicine

## 2023-05-17 ENCOUNTER — Encounter: Payer: Self-pay | Admitting: Internal Medicine

## 2023-05-17 DIAGNOSIS — J189 Pneumonia, unspecified organism: Secondary | ICD-10-CM

## 2023-05-17 NOTE — Telephone Encounter (Signed)
She does not need labs but was likely mistaken. She was to come back for CXR which is entered already.

## 2023-05-17 NOTE — Telephone Encounter (Signed)
Pt has been scheduled for lab appt on 9/9. No order, please enter lab orders if appropriate.

## 2023-05-26 ENCOUNTER — Encounter: Payer: Self-pay | Admitting: Internal Medicine

## 2023-05-26 ENCOUNTER — Encounter: Payer: Self-pay | Admitting: Cardiology

## 2023-05-26 DIAGNOSIS — M25511 Pain in right shoulder: Secondary | ICD-10-CM

## 2023-05-28 ENCOUNTER — Other Ambulatory Visit: Payer: Self-pay | Admitting: Nurse Practitioner

## 2023-05-28 ENCOUNTER — Ambulatory Visit: Payer: Medicare Other | Admitting: Sports Medicine

## 2023-05-28 ENCOUNTER — Ambulatory Visit (INDEPENDENT_AMBULATORY_CARE_PROVIDER_SITE_OTHER): Payer: Medicare Other

## 2023-05-28 VITALS — BP 110/74 | HR 77 | Ht 65.0 in | Wt 204.0 lb

## 2023-05-28 DIAGNOSIS — M25511 Pain in right shoulder: Secondary | ICD-10-CM | POA: Diagnosis not present

## 2023-05-28 DIAGNOSIS — G8929 Other chronic pain: Secondary | ICD-10-CM

## 2023-05-28 DIAGNOSIS — J189 Pneumonia, unspecified organism: Secondary | ICD-10-CM

## 2023-05-28 DIAGNOSIS — R918 Other nonspecific abnormal finding of lung field: Secondary | ICD-10-CM | POA: Diagnosis not present

## 2023-05-28 DIAGNOSIS — M19011 Primary osteoarthritis, right shoulder: Secondary | ICD-10-CM | POA: Diagnosis not present

## 2023-05-28 DIAGNOSIS — Z1231 Encounter for screening mammogram for malignant neoplasm of breast: Secondary | ICD-10-CM

## 2023-05-28 NOTE — Patient Instructions (Signed)
Good to see you  Injection in shoulder today Exercises given  Follow up in 4 weeks

## 2023-05-28 NOTE — Progress Notes (Addendum)
Desiree Snow D.[facility] [address] [place] [address] Phone: [phone]   Assessment and Plan:     1. Chronic right shoulder pain -Chronic with exacerbation, initial sports medicine visit - Most consistent with rotator cuff tendinopathy and subacromial bursitis present for the past several months and generally worsening. - X-ray obtained in clinic.  My interpretation: No acute fracture or dislocation.  Mild degenerative changes at [facility] joint and inferior glenoid - We discussed that NSAIDs and prednisone are not good options for patient with past medical history of atrial fibrillation on chronic anticoagulation with Eliquis and stomach ulcers - Patient elected to proceed with subacromial CSI.  Tolerated well per note below.  Patient had increased risk of bleeding with chronic anticoagulation on Eliquis - Start HEP for shoulder and rotator cuff - Start Tylenol 500 to 1000 mg tablets 2-3 times a day for day-to-day pain relief  Procedure: Subacromial Injection Side: Right  Risks explained and consent was given verbally. The site was cleaned with alcohol prep. A steroid injection was performed from posterior approach using 2mL of 1% lidocaine without epinephrine and 1mL of kenalog 40mg /ml. This was well tolerated and resulted in symptomatic relief.  Needle was removed, hemostasis achieved, and post injection instructions were explained.   Pt was advised to call or return to clinic if these symptoms worsen or fail to improve as anticipated.     Pertinent previous records reviewed include none   Follow Up: 4 weeks for reevaluation.  If no improvement or worsening of symptoms, could consider alternative CSI versus physical therapy.  Patient also would like to discuss chronic SI joint pain and right inner thigh pain at that visit, so we could obtain a right hip with pelvis x-ray   Subjective:    Chief Complaint: right shoulder pain   HPI:    05/28/23 Patient is a 72 year old female presenting with right shoulder pain going on for for a couple months. 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 Patient states it started as like a bruising feeling on top of the shoulder where the bag was. Patient states it has since then the pain is worsening. Using topicals, ice, rest, does do chair yoga, and tylenol before bed. Pain is constant but worse when fully extending the arm out. Movement is when it is the pain is sharp, patient feels a pulling sensation when she does extend fully.  Relevant Historical Information: Atrial fibrillation on chronic anticoagulation with Eliquis, history of stomach ulcer,  Additional pertinent review of systems negative.   Current Outpatient Medications:    apixaban (ELIQUIS) 5 MG TABS tablet, Take 1 tablet (5 mg total) by mouth 2 (two) times daily., Disp: 60 tablet, Rfl: 6   apixaban (ELIQUIS) 5 MG TABS tablet, Take 1 tablet (5 mg total) by mouth 2 (two) times daily., Disp: 28 tablet, Rfl: 0   Calcium Carbonate-Vit D-Min (CALCIUM 1200 PO), Take 1 tablet by mouth daily., Disp: , Rfl:    Cholecalciferol (VITAMIN D3) 1000 units CAPS, Take 1,000 Units by mouth daily., Disp: , Rfl:    CINNAMON PO, Take 100 mg by mouth at bedtime., Disp: , Rfl:    clorazepate (TRANXENE) 3.75 MG tablet, Take 1 tablet (3.75 mg total) by mouth daily as needed for anxiety. TAKE 1 TABLET BY MOUTH EVERY DAY AS NEEDED ANXIETY, Disp: 30 tablet, Rfl: 3   Collagen-Vitamin C (COLLAGEN  PLUS VITAMIN C) 740-125 MG CAPS, Take 740 mg by mouth daily., Disp: , Rfl:    diltiazem (CARDIZEM) 30 MG tablet, Take 1 tablet (30 mg total) by mouth every 6 (six) hours as needed., Disp: 30 tablet, Rfl: 3   famotidine (PEPCID) 20 MG tablet, Take 20 mg by mouth daily. , Disp: , Rfl:    ferrous sulfate 325 (65 FE) MG tablet, Take 325 mg by mouth daily with breakfast., Disp: , Rfl:    Flaxseed, Linseed, (FLAX  SEED OIL PO), Take 1 capsule by mouth daily., Disp: , Rfl:    lamoTRIgine (LAMICTAL) 100 MG tablet, Take 1 tablet (100 mg total) by mouth 2 (two) times daily., Disp: 180 tablet, Rfl: 4   Magnesium 250 MG TABS, Take 250 mg by mouth daily., Disp: , Rfl:    Multiple Vitamin (MULTIVITAMIN) tablet, Take 1 tablet by mouth daily., Disp: , Rfl:    pantoprazole (PROTONIX) 40 MG tablet, TAKE 1 TABLET BY MOUTH TWICE A DAY BEFORE BREAKFAST/SUPPER, Disp: 180 tablet, Rfl: 1   TURMERIC PO, Take 1 tablet by mouth daily., Disp: , Rfl:    Objective:     Vitals:   05/28/23 0930  BP: 110/74  Pulse: 77  SpO2: 96%  Weight: 204 lb (92.5 kg)  Height: 5\' 5"  (1.651 m)      Body mass index is 33.95 kg/m.    Physical Exam:    Gen: Appears well, nad, nontoxic and pleasant Neuro:sensation intact, strength is 5/5 with df/pf/inv/ev, muscle tone wnl Skin: no suspicious lesion or defmority Psych: A&O, appropriate mood and affect  Right shoulder:  No deformity, swelling or muscle wasting No scapular winging FF 180 with pain, abd 180 with pain, int 20, ext 80 TTP AC, deltoid NTTP over the [place], clavicle,    coracoid, biceps groove, humerus,  , trapezius, cervical spine Positive Neer, Hawkins, empty can, O'Brien, Speed Neg  subscap liftoff,   Neg ant drawer, sulcus sign, apprehension Negative Spurling's test bilat FROM of neck    Electronically signed by:  Desiree Snow D.[facility] 9:50 AM 05/28/23

## 2023-05-29 ENCOUNTER — Ambulatory Visit: Payer: Medicare Other | Attending: Diagnostic Neuroimaging

## 2023-05-29 DIAGNOSIS — G459 Transient cerebral ischemic attack, unspecified: Secondary | ICD-10-CM

## 2023-05-29 DIAGNOSIS — G40909 Epilepsy, unspecified, not intractable, without status epilepticus: Secondary | ICD-10-CM

## 2023-05-29 DIAGNOSIS — Z9189 Other specified personal risk factors, not elsewhere classified: Secondary | ICD-10-CM | POA: Diagnosis not present

## 2023-05-31 ENCOUNTER — Encounter: Payer: Self-pay | Admitting: Cardiology

## 2023-05-31 NOTE — Telephone Encounter (Signed)
Error

## 2023-06-03 ENCOUNTER — Encounter: Payer: Self-pay | Admitting: Sports Medicine

## 2023-06-04 ENCOUNTER — Other Ambulatory Visit: Payer: Self-pay | Admitting: *Deleted

## 2023-06-04 DIAGNOSIS — I480 Paroxysmal atrial fibrillation: Secondary | ICD-10-CM

## 2023-06-07 ENCOUNTER — Encounter: Payer: Self-pay | Admitting: Internal Medicine

## 2023-06-17 ENCOUNTER — Other Ambulatory Visit: Payer: Medicare Other

## 2023-06-24 ENCOUNTER — Ambulatory Visit
Admission: RE | Admit: 2023-06-24 | Discharge: 2023-06-24 | Disposition: A | Discharge status: Home / Self Care | Payer: Medicare Other | Source: Ambulatory Visit | Attending: Nurse Practitioner

## 2023-06-24 DIAGNOSIS — Z1239 Encounter for other screening for malignant neoplasm of breast: Secondary | ICD-10-CM | POA: Diagnosis not present

## 2023-06-24 DIAGNOSIS — Z803 Family history of malignant neoplasm of breast: Secondary | ICD-10-CM | POA: Diagnosis not present

## 2023-06-24 DIAGNOSIS — N6091 Unspecified benign mammary dysplasia of right breast: Secondary | ICD-10-CM

## 2023-06-24 MED ORDER — GADOPICLENOL 0.5 MMOL/ML IV SOLN
6.0000 mL | Freq: Once | INTRAVENOUS | Status: AC | PRN
  Administered 2023-06-24: 6 mL via INTRAVENOUS

## 2023-06-25 ENCOUNTER — Ambulatory Visit: Payer: Medicare Other | Admitting: Sports Medicine

## 2023-06-25 ENCOUNTER — Encounter: Payer: Self-pay | Admitting: Cardiology

## 2023-07-08 ENCOUNTER — Encounter: Payer: Self-pay | Admitting: Physician Assistant

## 2023-07-08 ENCOUNTER — Ambulatory Visit: Payer: Medicare Other | Attending: Physician Assistant | Admitting: Physician Assistant

## 2023-07-08 VITALS — BP 116/72 | HR 82 | Ht 66.0 in | Wt 202.2 lb

## 2023-07-08 DIAGNOSIS — Z7901 Long term (current) use of anticoagulants: Secondary | ICD-10-CM | POA: Diagnosis not present

## 2023-07-08 DIAGNOSIS — Z87891 Personal history of nicotine dependence: Secondary | ICD-10-CM | POA: Diagnosis not present

## 2023-07-08 DIAGNOSIS — I480 Paroxysmal atrial fibrillation: Secondary | ICD-10-CM | POA: Diagnosis not present

## 2023-07-08 DIAGNOSIS — G459 Transient cerebral ischemic attack, unspecified: Secondary | ICD-10-CM | POA: Diagnosis not present

## 2023-07-08 LAB — BASIC METABOLIC PANEL
BUN/Creatinine Ratio: 15 (ref 12–28)
BUN: 14 mg/dL (ref 8–27)
CO2: 24 mmol/L (ref 20–29)
Calcium: 9.6 mg/dL (ref 8.7–10.3)
Chloride: 102 mmol/L (ref 96–106)
Creatinine, Ser: 0.91 mg/dL (ref 0.57–1.00)
Glucose: 87 mg/dL (ref 70–99)
Potassium: 4.6 mmol/L (ref 3.5–5.2)
Sodium: 139 mmol/L (ref 134–144)
eGFR: 67 mL/min/{1.73_m2} (ref >59)

## 2023-07-08 LAB — TSH+T4F+T3FREE
Free T4: 1.13 ng/dL (ref 0.82–1.77)
T3, Free: 3 pg/mL (ref 2.0–4.4)
TSH: 1.24 u[IU]/mL (ref 0.450–4.500)

## 2023-07-08 LAB — MAGNESIUM: Magnesium: 2.1 mg/dL (ref 1.6–2.3)

## 2023-07-08 MED ORDER — APIXABAN 5 MG PO TABS
5.0000 mg | ORAL_TABLET | Freq: Two times a day (BID) | ORAL | 3 refills | Status: DC
Start: 2023-07-08 — End: 2024-08-04

## 2023-07-08 NOTE — Patient Instructions (Signed)
Medication Instructions:  NO CHANGES *If you need a refill on your cardiac medications before your next appointment, please call your pharmacy*   Lab Work: TSH+FREE T3&T4, BMP, MAGNESIUM TODAY. If you have labs (blood work) drawn today and your tests are completely normal, you will receive your results only by: MyChart Message (if you have MyChart) OR A paper copy in the mail If you have any lab test that is abnormal or we need to change your treatment, we will call you to review the results.   Testing/Procedures: NO TESTING   Follow-Up: At [facility], you and your health needs are our priority.  As part of our continuing mission to provide you with exceptional heart care, we have created designated Provider Care Teams.  These Care Teams include your primary Cardiologist (physician) and Advanced Practice Providers (APPs -  Physician Assistants and Nurse Practitioners) who all work together to provide you with the care you need, when you need it.   Your next appointment:   6 month(s)  Provider:   Donato Schultz, MD ONLY.

## 2023-07-09 DIAGNOSIS — H25043 Posterior subcapsular polar age-related cataract, bilateral: Secondary | ICD-10-CM | POA: Diagnosis not present

## 2023-07-09 DIAGNOSIS — H25013 Cortical age-related cataract, bilateral: Secondary | ICD-10-CM | POA: Diagnosis not present

## 2023-07-09 DIAGNOSIS — H53462 Homonymous bilateral field defects, left side: Secondary | ICD-10-CM | POA: Diagnosis not present

## 2023-07-09 DIAGNOSIS — H2513 Age-related nuclear cataract, bilateral: Secondary | ICD-10-CM | POA: Diagnosis not present

## 2023-07-09 DIAGNOSIS — H2511 Age-related nuclear cataract, right eye: Secondary | ICD-10-CM | POA: Diagnosis not present

## 2023-07-09 DIAGNOSIS — H18413 Arcus senilis, bilateral: Secondary | ICD-10-CM | POA: Diagnosis not present

## 2023-07-16 ENCOUNTER — Telehealth: Payer: Self-pay | Admitting: *Deleted

## 2023-07-16 NOTE — Telephone Encounter (Signed)
   Patient Name: Desiree Snow  DOB: 1950/10/18 MRN: [id number]  Primary Cardiologist: Donato Schultz, MD  Chart reviewed as part of pre-operative protocol coverage. Cataract extractions are recognized in guidelines as low risk surgeries that do not typically require specific preoperative testing or holding of blood thinner therapy. Therefore, given past medical history and time since last visit, based on ACC/AHA guidelines, Hildagard Sobecki Bekele would be at acceptable risk for the planned procedure without further cardiovascular testing.   There were no requests to hold Eliquis which the patient is taking for paroxysmal atrial fibrillation. If this is a concern please re-send pre-operative request.   I will route this recommendation to the requesting party via Epic fax function and remove from pre-op pool.  Please call with questions.  Joni Reining, NP 07/16/2023, 4:15 PM

## 2023-07-16 NOTE — Telephone Encounter (Signed)
   Pre-operative Risk Assessment    Patient Name: Desiree Snow  DOB: Oct 01, 1951 MRN: [id number]      Request for Surgical Clearance    Procedure:   CATARACT EXTRACTION W/ INTRAOCULAR LENS IMPLANTATION OF THE RIGHT EYE 09/18/23 THEN LEFT EYE 10/16/23  Date of Surgery:  Clearance 09/18/23     & 10/16/2023                            Surgeon:  DR. Marcial Pacas BEVIS Surgeon's Group or Practice Name:  [facility] Phone number:  [phone] Fax number:  [phone]   Type of Clearance Requested:   - Medical    Type of Anesthesia:  Not Indicated   Additional requests/questions:    Wilhemina Cash   07/16/2023, 10:33 AM

## 2023-07-17 ENCOUNTER — Telehealth: Payer: Self-pay | Admitting: Anesthesiology

## 2023-07-17 NOTE — Telephone Encounter (Signed)
Medical clearance form from [facility] was received. Form has been completed and placed on Dr [employer] desk for signature.

## 2023-07-17 NOTE — Telephone Encounter (Signed)
Dr. Marjory Lies has signed the form and we will send to [facility] eye surgery received confirmation

## 2023-07-17 NOTE — Telephone Encounter (Signed)
I have updated vaccines

## 2023-07-19 ENCOUNTER — Other Ambulatory Visit: Payer: Self-pay | Admitting: Diagnostic Neuroimaging

## 2023-07-22 ENCOUNTER — Encounter: Payer: Self-pay | Admitting: Internal Medicine

## 2023-07-28 NOTE — Progress Notes (Deleted)
  Electrophysiology Office Note:   Date:  07/28/2023  ID:  Desiree Snow, DOB May 17, 1951, MRN [id number]  Primary Cardiologist: Donato Schultz, MD Electrophysiologist: Kingdom Vanzanten Jorja Loa, MD  {Click to update primary MD,subspecialty MD or APP then REFRESH:1}    History of Present Illness:   Desiree Snow is a 72 y.o. 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    She was seen in cardiology clinic August 2024.  She was noted to be in rapid atrial fibrillation.  Echo showed a normal ejection fraction.  She wore a cardiac monitor that showed a 6% atrial fibrillation burden.  She takes as needed Cardizem for her atrial fibrillation.***  Review of systems complete and found to be negative unless listed in HPI.   EP Information / Studies Reviewed:    {EKGtoday:28818}        Risk Assessment/Calculations:    CHA2DS2-VASc Score = 4  {Confirm score is correct.  If not, click here to update score.  REFRESH note.  :1} This indicates a 4.8% annual risk of stroke. The patient's score is based upon: CHF History: 0 HTN History: 0 Diabetes History: 0 Stroke History: 2 Vascular Disease History: 0 Age Score: 1 Gender Score: 1   {This patient has a significant risk of stroke if diagnosed with atrial fibrillation.  Please consider VKA or DOAC agent for anticoagulation if the bleeding risk is acceptable.   You can also use the SmartPhrase .[email or web address] for documentation.   :[id number]} No BP recorded.  {Refresh Note OR Click here to enter BP  :1}***        Physical Exam:   VS:  There were no vitals taken for this visit.   Wt Readings from Last 3 Encounters:  07/08/23 202 lb 3.2 oz (91.7 kg)  05/28/23 204 lb (92.5 kg)  05/10/23 207 lb (93.9 kg)     GEN: Well nourished, well developed in no acute distress NECK: No JVD;  No carotid bruits CARDIAC: {EPRHYTHM:28826}, no murmurs, rubs, gallops RESPIRATORY:  Clear to auscultation without rales, wheezing or rhonchi  ABDOMEN: Soft, non-tender, non-distended EXTREMITIES:  No edema; No deformity   ASSESSMENT AND PLAN:    1.  Paroxysmal atrial fibrillation: Currently on diltiazem.***  2.  Secondary hypercoagulable state: Currently on Eliquis  3.  TIA: Plan per neurology  Follow up with {NWGNF:62130} {EPFOLLOW QM:57846}  Signed, Abril Cappiello Jorja Loa, MD

## 2023-07-29 NOTE — Progress Notes (Unsigned)
  Electrophysiology Office Note:   Date:  07/30/2023  ID:  Desiree Snow, DOB [birth year], MRN [id number]  Primary Cardiologist: Donato Schultz, MD Electrophysiologist: Regan Lemming, MD      History of Present Illness:   Desiree Snow is a 72 y.o. 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    She was found to have atrial fibrillation on cardiac monitor.  She was started on diltiazem.  Echo showed a normal ejection fraction.  Cardiac monitor showed a 6% burden of atrial fibrillation.  She has symptoms of dyspnea and dizziness when in atrial fibrillation.  She overall feels well.  She is quite concerned about her atrial fibrillation and how it affects her life.  She is somewhat symptomatic from her atrial fibrillation, but she is quite concerned about longevity.  Review of systems complete and found to be negative unless listed in HPI.   EP Information / Studies Reviewed:    EKG is not ordered today. EKG from 05/10/23 reviewed which showed sinus rhythm        Risk Assessment/Calculations:    CHA2DS2-VASc Score = 4   This indicates a 4.8% annual risk of stroke. The patient's score is based upon: CHF History: 0 HTN History: 0 Diabetes History: 0 Stroke History: 2 Vascular Disease History: 0 Age Score: 1 Gender Score: 1             Physical Exam:   VS:  BP 120/84 (BP Location: Left Arm, Patient Position: Sitting, Cuff Size: Large)   Pulse 86   Ht 5\' 6"  (1.676 m)   Wt 202 lb 6.4 oz (91.8 kg)   SpO2 96%   BMI 32.67 kg/m    Wt Readings from Last 3 Encounters:  07/30/23 202 lb 6.4 oz (91.8 kg)  07/08/23 202 lb 3.2 oz (91.7 kg)  05/28/23 204 lb (92.5 kg)     GEN: Well nourished, well developed in no acute distress NECK: No JVD; No carotid bruits CARDIAC: Regular rate and rhythm, no  murmurs, rubs, gallops RESPIRATORY:  Clear to auscultation without rales, wheezing or rhonchi  ABDOMEN: Soft, non-tender, non-distended EXTREMITIES:  No edema; No deformity   ASSESSMENT AND PLAN:    1.  Paroxysmal atrial fibrillation: Currently on as needed diltiazem.  She is symptomatic from her atrial fibrillation.  She also feels that she is having PVCs that make her symptomatic.  On a cardiac monitor, PVC burden is less than 1%.  We discussed further options to control her atrial fibrillation including ablation versus medication management.  She would like to avoid ablation for now.  Desiree Snow start diltiazem 120 mg, flecainide 50 mg twice daily.  Desiree Snow have her follow-up in A-fib clinic.  If medication titration is not needed, treadmill test would be reasonable at that time.  2.  Secondary hypercoagulable state: Currently on Eliquis  3.  TIA: Plan per neurology  Follow up with Afib Clinic in 6 months  Signed, [name], MD

## 2023-07-30 ENCOUNTER — Ambulatory Visit: Payer: Medicare Other | Attending: Cardiology | Admitting: Cardiology

## 2023-07-30 ENCOUNTER — Ambulatory Visit: Payer: Medicare Other | Admitting: Cardiology

## 2023-07-30 ENCOUNTER — Encounter: Payer: Self-pay | Admitting: Cardiology

## 2023-07-30 VITALS — BP 120/84 | HR 86 | Ht 66.0 in | Wt 202.4 lb

## 2023-07-30 DIAGNOSIS — I480 Paroxysmal atrial fibrillation: Secondary | ICD-10-CM

## 2023-07-30 DIAGNOSIS — D6869 Other thrombophilia: Secondary | ICD-10-CM

## 2023-07-30 MED ORDER — DILTIAZEM HCL ER COATED BEADS 120 MG PO CP24: 120.0000 mg | ORAL_CAPSULE | Freq: Every day | ORAL | 3 refills | Status: DC

## 2023-07-30 MED ORDER — FLECAINIDE ACETATE 100 MG PO TABS: 100.0000 mg | ORAL_TABLET | Freq: Two times a day (BID) | ORAL | 3 refills | Status: DC

## 2023-07-30 NOTE — Patient Instructions (Signed)
Medication Instructions:  Your physician has recommended you make the following change in your medication:  START Flecainide 50 mg twice a day START Diltiazem 120 mg once daily  *If you need a refill on your cardiac medications before your next appointment, please call your pharmacy*   Lab Work: None ordered   Testing/Procedures: None ordered   Follow-Up: At [facility], you and your health needs are our priority.  As part of our continuing mission to provide you with exceptional heart care, we have created designated Provider Care Teams.  These Care Teams include your primary Cardiologist (physician) and Advanced Practice Providers (APPs -  Physician Assistants and Nurse Practitioners) who all work together to provide you with the care you need, when you need it.  Your next appointment:   6 week(s)  The format for your next appointment:   In Person  Provider:   You will follow up in the Atrial Fibrillation Clinic located at [facility]. Your provider will be: Clint R. Fenton, PA-C or Lake Bells, PA-C    Thank you for choosing [facility]!!   Dory Horn, RN [phone]  Other Instructions  Flecainide Tablets What is this medication? FLECAINIDE (FLEK a nide) prevents and treats a fast or irregular heartbeat (arrhythmia). It is often used to treat a type of arrhythmia known as AFib (atrial fibrillation). It works by slowing down overactive electric signals in the heart, which stabilizes your heart rhythm. It belongs to a group of medications called antiarrhythmics. This medicine may be used for other purposes; ask your health care provider or pharmacist if you have questions. COMMON BRAND NAME(S): Tambocor What should I tell my care team before I take this medication? They need to know if you have any of these conditions: High or low levels of potassium in the blood Heart disease including heart rhythm and heart rate problems Kidney disease Liver  disease Recent heart attack An unusual or allergic reaction to flecainide, other medications, foods, dyes, or preservatives Pregnant or trying to get pregnant Breastfeeding How should I use this medication? Take this medication by mouth with a glass of water. Take it as directed on the prescription label at the same time every day. You can take it with or without food. If it upsets your stomach, take it with food. Do not take your medication more often than directed. Do not stop taking this medication suddenly. This may cause serious, heart-related side effects. If your care team wants you to stop the medication, the dose may be slowly lowered over time to avoid any side effects. Talk to your care team about the use of this medication in children. While it may be prescribed for children as young as 1 year for selected conditions, precautions do apply. Overdosage: If you think you have taken too much of this medicine contact a poison control center or emergency room at once. NOTE: This medicine is only for you. Do not share this medicine with others. What if I miss a dose? If you miss a dose, take it as soon as you can. If it is almost time for your next dose, take only that dose. Do not take double or extra doses. What may interact with this medication? Do not take this medication with any of the following: Amoxapine Arsenic trioxide Certain antibiotics, such as clarithromycin, erythromycin, gatifloxacin, gemifloxacin, levofloxacin, moxifloxacin, sparfloxacin, or troleandomycin Certain antidepressants, called tricyclic antidepressants such as amitriptyline, imipramine, or nortriptyline Certain medications for irregular heartbeat, such as disopyramide, encainide,  moricizine, procainamide, propafenone, and quinidine Cisapride Delavirdine Droperidol Haloperidol Hawthorn Imatinib Levomethadyl Maprotiline Medications for malaria, such as chloroquine and  halofantrine Pentamidine Phenothiazines, such as chlorpromazine, mesoridazine, prochlorperazine, thioridazine Pimozide Quinine Ranolazine Ritonavir Sertindole This medication may also interact with the following: Cimetidine Dofetilide Medications for angina or blood pressure Medications for irregular heartbeat, such as amiodarone and digoxin Ziprasidone This list may not describe all possible interactions. Give your health care provider a list of all the medicines, herbs, non-prescription drugs, or dietary supplements you use. Also tell them if you smoke, drink alcohol, or use illegal drugs. Some items may interact with your medicine. What should I watch for while using this medication? Visit your care team for regular checks on your progress. Because your condition and the use of this medication carries some risk, it is a good idea to carry an identification card, necklace, or bracelet with details of your condition, medications, and care team. Check your blood pressure and pulse rate as directed. Know what your blood pressure and pulse rate should be and when tod contact your care team. Your care team may schedule regular blood tests and electrocardiograms to check your progress. This medication may affect your coordination, reaction time, or judgment. Do not drive or operate machinery until you know how this medication affects you. Sit up or stand slowly to reduce the risk of dizzy or fainting spells. Drinking alcohol with this medication can increase the risk of these side effects. What side effects may I notice from receiving this medication? Side effects that you should report to your care team as soon as possible: Allergic reactions--skin rash, itching, hives, swelling of the face, lips, tongue, or throat Heart failure--shortness of breath, swelling of the ankles, feet, or hands, sudden weight gain, unusual weakness or fatigue Heart rhythm changes--fast or irregular heartbeat,  dizziness, feeling faint or lightheaded, chest pain, trouble breathing Liver injury--right upper belly pain, loss of appetite, nausea, light-colored stool, dark yellow or brown urine, yellowing skin or eyes, unusual weakness or fatigue Side effects that usually do not require medical attention (report to your care team if they continue or are bothersome): Blurry vision Constipation Dizziness Fatigue Headache Nausea Tremors or shaking This list may not describe all possible side effects. Call your doctor for medical advice about side effects. You may report side effects to FDA at 1-800-FDA-1088. Where should I keep my medication? Keep out of the reach of children and pets. Store at room temperature between 15 and 30 degrees C (59 and 86 degrees F). Protect from light. Keep container tightly closed. Throw away any unused medication after the expiration date. NOTE: This sheet is a summary. It may not cover all possible information. If you have questions about this medicine, talk to your doctor, pharmacist, or health care provider.  2024 Elsevier/Gold Standard (2022-05-02 00:00:00)

## 2023-08-05 NOTE — Progress Notes (Unsigned)
    Aleen Sells D.[facility] [address] [place] [address] Phone: [phone]   Assessment and Plan:     There are no diagnoses linked to this encounter.  ***   Pertinent previous records reviewed include ***   Follow Up: ***     Subjective:   I, Desiree Snow, am serving as a Neurosurgeon for Doctor Richardean Sale   Chief Complaint: groin pain    HPI:   08/06/2023 Patient is a 72 year old female complaining of groin pain. Patient states    Relevant Historical Information: Atrial fibrillation on chronic anticoagulation with Eliquis, history of stomach ulcer,  Additional pertinent review of systems negative.   Current Outpatient Medications:    apixaban (ELIQUIS) 5 MG TABS tablet, Take 1 tablet (5 mg total) by mouth 2 (two) times daily., Disp: 180 tablet, Rfl: 3   Calcium Carbonate-Vit D-Min (CALCIUM 1200 PO), Take 1 tablet by mouth daily., Disp: , Rfl:    Cholecalciferol (VITAMIN D3) 1000 units CAPS, Take 1,000 Units by mouth daily., Disp: , Rfl:    CINNAMON PO, Take 100 mg by mouth at bedtime., Disp: , Rfl:    clorazepate (TRANXENE) 3.75 MG tablet, TAKE 1 TABLET EVERY DAY AS NEEDED FOR ANXIETY, Disp: 30 tablet, Rfl: 2   Collagen-Vitamin C (COLLAGEN PLUS VITAMIN C) 740-125 MG CAPS, Take 740 mg by mouth daily., Disp: , Rfl:    diltiazem (CARDIZEM CD) 120 MG 24 hr capsule, Take 1 capsule (120 mg total) by mouth daily., Disp: 30 capsule, Rfl: 3   diltiazem (CARDIZEM) 30 MG tablet, Take 1 tablet (30 mg total) by mouth every 6 (six) hours as needed., Disp: 30 tablet, Rfl: 3   famotidine (PEPCID) 20 MG tablet, Take 20 mg by mouth daily. , Disp: , Rfl:    ferrous sulfate 325 (65 FE) MG tablet, Take 325 mg by mouth daily with breakfast., Disp: , Rfl:    Flaxseed, Linseed, (FLAX SEED OIL PO), Take 1 capsule by mouth daily., Disp: , Rfl:    flecainide (TAMBOCOR) 100 MG tablet, Take 1 tablet (100 mg total) by mouth 2 (two) times daily.,  Disp: 60 tablet, Rfl: 3   lamoTRIgine (LAMICTAL) 100 MG tablet, Take 1 tablet (100 mg total) by mouth 2 (two) times daily., Disp: 180 tablet, Rfl: 4   Magnesium 250 MG TABS, Take 250 mg by mouth daily., Disp: , Rfl:    Multiple Vitamin (MULTIVITAMIN) tablet, Take 1 tablet by mouth daily., Disp: , Rfl:    pantoprazole (PROTONIX) 40 MG tablet, TAKE 1 TABLET BY MOUTH TWICE A DAY BEFORE BREAKFAST/SUPPER, Disp: 180 tablet, Rfl: 1   TURMERIC PO, Take 1 tablet by mouth daily., Disp: , Rfl:    Objective:     There were no vitals filed for this visit.    There is no height or weight on file to calculate BMI.    Physical Exam:    ***   Electronically signed by:  Aleen Sells D.[facility] 4:12 PM 08/05/23

## 2023-08-06 ENCOUNTER — Other Ambulatory Visit: Payer: Self-pay

## 2023-08-06 ENCOUNTER — Ambulatory Visit (INDEPENDENT_AMBULATORY_CARE_PROVIDER_SITE_OTHER): Payer: Medicare Other

## 2023-08-06 ENCOUNTER — Ambulatory Visit: Payer: Medicare Other | Admitting: Sports Medicine

## 2023-08-06 ENCOUNTER — Ambulatory Visit: Payer: Medicare Other | Admitting: Internal Medicine

## 2023-08-06 VITALS — BP 130/80 | HR 81 | Ht 66.0 in | Wt 204.0 lb

## 2023-08-06 DIAGNOSIS — M25551 Pain in right hip: Secondary | ICD-10-CM

## 2023-08-06 DIAGNOSIS — M160 Bilateral primary osteoarthritis of hip: Secondary | ICD-10-CM | POA: Diagnosis not present

## 2023-08-06 DIAGNOSIS — M1611 Unilateral primary osteoarthritis, right hip: Secondary | ICD-10-CM | POA: Diagnosis not present

## 2023-08-06 NOTE — Patient Instructions (Signed)
As needed follow up 

## 2023-08-13 ENCOUNTER — Encounter: Payer: Self-pay | Admitting: Sports Medicine

## 2023-08-16 ENCOUNTER — Encounter: Payer: Self-pay | Admitting: Cardiology

## 2023-09-10 ENCOUNTER — Ambulatory Visit (HOSPITAL_COMMUNITY)
Admission: RE | Admit: 2023-09-10 | Discharge: 2023-09-10 | Disposition: A | Discharge status: Home / Self Care | Payer: Medicare Other | Source: Ambulatory Visit | Attending: Internal Medicine | Admitting: Internal Medicine

## 2023-09-10 VITALS — BP 138/66 | HR 77 | Ht 66.0 in | Wt 198.4 lb

## 2023-09-10 DIAGNOSIS — Z7901 Long term (current) use of anticoagulants: Secondary | ICD-10-CM | POA: Diagnosis not present

## 2023-09-10 DIAGNOSIS — I480 Paroxysmal atrial fibrillation: Secondary | ICD-10-CM | POA: Diagnosis not present

## 2023-09-10 DIAGNOSIS — Z5181 Encounter for therapeutic drug level monitoring: Secondary | ICD-10-CM | POA: Insufficient documentation

## 2023-09-10 DIAGNOSIS — Z79899 Other long term (current) drug therapy: Secondary | ICD-10-CM | POA: Insufficient documentation

## 2023-09-10 DIAGNOSIS — I4891 Unspecified atrial fibrillation: Secondary | ICD-10-CM | POA: Diagnosis not present

## 2023-09-10 DIAGNOSIS — D6869 Other thrombophilia: Secondary | ICD-10-CM | POA: Diagnosis not present

## 2023-09-10 DIAGNOSIS — Z8673 Personal history of transient ischemic attack (TIA), and cerebral infarction without residual deficits: Secondary | ICD-10-CM | POA: Insufficient documentation

## 2023-09-10 NOTE — Progress Notes (Addendum)
Primary Care Physician: Myrlene Broker, MD Primary Cardiologist: Donato Schultz, MD Electrophysiologist: Will Jorja Loa, MD     Referring Physician: Dr. Orpah Cobb is a 72 y.o. female with a history of TIA, right temporal lobe seizures post brain surgery in 1998, and paroxysmal atrial fibrillation who presents for consultation in the [facility] Atrial Fibrillation [facility]. Seen by Dr. Elberta Fortis on 07/30/23 and initiated flecainide 50 mg BID and diltiazem 120 mg daily for rhythm control and to suppress PVCs (burden low but patient symptomatic). Patient is on Eliquis 5 mg BID for a CHADS2VASC score of 4.  On evaluation today, she is currently in NSR. Patient states no episodes of Afib since last office visit. Patient is anxious about potential side effects and wonders if there is a contraindication between flecainide and the medications she takes. She took one dose of flecainide daily for several days to determine if the anxious feeling she had improved and it did not really. Patient does admit to having baseline anxiety.   Today, she denies symptoms of palpitations, chest pain, shortness of breath, orthopnea, PND, lower extremity edema, dizziness, presyncope, syncope, snoring, daytime somnolence, bleeding, or neurologic sequela. The patient is tolerating medications without difficulties and is otherwise without complaint today.   she has a BMI of Body mass index is 32.02 kg/m.Marland Kitchen Filed Weights   09/10/23 1403  Weight: 90 kg    Current Outpatient Medications  Medication Sig Dispense Refill   apixaban (ELIQUIS) 5 MG TABS tablet Take 1 tablet (5 mg total) by mouth 2 (two) times daily. 180 tablet 3   Calcium Carbonate-Vit D-Min (CALCIUM 1200 PO) Take 1 tablet by mouth daily.     Cholecalciferol (VITAMIN D3) 1000 units CAPS Take 1,000 Units by mouth daily.     CINNAMON PO Take 100 mg by mouth at bedtime.     clorazepate (TRANXENE) 3.75 MG tablet TAKE 1 TABLET  EVERY DAY AS NEEDED FOR ANXIETY 30 tablet 2   Collagen-Vitamin C (COLLAGEN PLUS VITAMIN C) 740-125 MG CAPS Take 740 mg by mouth daily.     Collagen-Vitamin C-Biotin (COLLAGEN PO) Biocell collagen-take 1 tablet by mouth daily Does not have Vitamin C in it     diltiazem (CARDIZEM CD) 120 MG 24 hr capsule Take 1 capsule (120 mg total) by mouth daily. 30 capsule 3   famotidine (PEPCID) 20 MG tablet Take 20 mg by mouth daily.     Flaxseed, Linseed, (FLAX SEED OIL PO) Take 1 capsule by mouth daily.     flecainide (TAMBOCOR) 100 MG tablet Take 1 tablet (100 mg total) by mouth 2 (two) times daily. 60 tablet 3   lamoTRIgine (LAMICTAL) 100 MG tablet Take 1 tablet (100 mg total) by mouth 2 (two) times daily. 180 tablet 4   Magnesium 250 MG TABS Take 250 mg by mouth daily.     Multiple Vitamin (MULTIVITAMIN) tablet Take 1 tablet by mouth daily.     TURMERIC PO Take 1 tablet by mouth daily.     No current facility-administered medications for this encounter.    Atrial Fibrillation Management history:  Previous antiarrhythmic drugs: flecainide Previous cardioversions: none Previous ablations: none Anticoagulation history: Eliquis 5 mg BID   ROS- All systems are reviewed and negative except as per the HPI above.  Physical Exam: BP 138/66   Pulse 77   Ht 5\' 6"  (1.676 m)   Wt 90 kg   BMI 32.02 kg/m   GEN: Well  nourished, well developed in no acute distress NECK: No JVD; No carotid bruits CARDIAC: Regular rate and rhythm, no murmurs, rubs, gallops RESPIRATORY:  Clear to auscultation without rales, wheezing or rhonchi  ABDOMEN: Soft, non-tender, non-distended EXTREMITIES:  No edema; No deformity   EKG today demonstrates  Vent. rate 77 BPM PR interval 174 ms QRS duration 88 ms QT/QTcB 370/418 ms P-R-T axes 53 5 56 Normal sinus rhythm Low voltage QRS Borderline ECG When compared with ECG of 10-May-2023 09:07, PREVIOUS ECG IS PRESENT  Echo 05/14/23 demonstrated  1. Left ventricular  ejection fraction, by estimation, is 60 to 65%. Left  ventricular ejection fraction by 3D volume is 59 %. The left ventricle has  normal function. The left ventricle has no regional wall motion  abnormalities. Left ventricular diastolic   parameters were normal.   2. Right ventricular systolic function is normal. The right ventricular  size is normal. Tricuspid regurgitation signal is inadequate for assessing  PA pressure.   3. Left atrial size was mildly dilated.   4. The mitral valve is normal in structure. No evidence of mitral valve  regurgitation. No evidence of mitral stenosis.   5. The aortic valve is tricuspid. Aortic valve regurgitation is not  visualized. No aortic stenosis is present.   6. The inferior vena cava is normal in size with greater than 50%  respiratory variability, suggesting right atrial pressure of 3 mmHg.   ASSESSMENT & PLAN CHA2DS2-VASc Score = 4  The patient's score is based upon: CHF History: 0 HTN History: 0 Diabetes History: 0 Stroke History: 2 Vascular Disease History: 0 Age Score: 1 Gender Score: 1       ASSESSMENT AND PLAN: Paroxysmal Atrial Fibrillation (ICD10:  I48.0) The patient's CHA2DS2-VASc score is 4, indicating a 4.8% annual risk of stroke.    She is currently in NSR.  Intervals are stable. Continue flecainide 50 mg BID. Continue diltiazem 120 mg daily. Will schedule TST.   Secondary Hypercoagulable State (ICD10:  D68.69) The patient is at significant risk for stroke/thromboembolism based upon her CHA2DS2-VASc Score of 4.  Continue Apixaban (Eliquis).    Addendum 09/16/23: removed "breast cancer" from PMH; patient has history of high risk for breast cancer.   Follow up 6 months with Dr. Elberta Fortis.    Lake Bells, PA-C  Afib Clinic [facility] [address] [place], [place] [address] [phone]

## 2023-09-11 ENCOUNTER — Encounter: Payer: Self-pay | Admitting: Cardiology

## 2023-09-12 ENCOUNTER — Other Ambulatory Visit (HOSPITAL_COMMUNITY): Payer: Self-pay | Admitting: *Deleted

## 2023-09-16 NOTE — Addendum Note (Signed)
Encounter addended by: Eustace Pen, PA-C on: 09/16/2023 11:03 AM  Actions taken: Clinical Note Signed

## 2023-09-16 NOTE — Addendum Note (Signed)
Encounter addended by: Eustace Pen, PA-C on: 09/16/2023 3:23 PM  Actions taken: Clinical Note Signed

## 2023-09-18 DIAGNOSIS — H2511 Age-related nuclear cataract, right eye: Secondary | ICD-10-CM | POA: Diagnosis not present

## 2023-09-19 DIAGNOSIS — H25012 Cortical age-related cataract, left eye: Secondary | ICD-10-CM | POA: Diagnosis not present

## 2023-09-19 DIAGNOSIS — H2512 Age-related nuclear cataract, left eye: Secondary | ICD-10-CM | POA: Diagnosis not present

## 2023-09-19 DIAGNOSIS — H25042 Posterior subcapsular polar age-related cataract, left eye: Secondary | ICD-10-CM | POA: Diagnosis not present

## 2023-09-21 DIAGNOSIS — M533 Sacrococcygeal disorders, not elsewhere classified: Secondary | ICD-10-CM | POA: Diagnosis not present

## 2023-09-30 ENCOUNTER — Telehealth (HOSPITAL_COMMUNITY): Payer: Self-pay

## 2023-09-30 NOTE — Telephone Encounter (Signed)
Detailed instructions left on the patient's answering machine. Asked to call back with any questions. S.Aby Gessel CCT

## 2023-10-08 ENCOUNTER — Encounter (HOSPITAL_COMMUNITY): Payer: Self-pay

## 2023-10-08 ENCOUNTER — Ambulatory Visit (HOSPITAL_COMMUNITY): Payer: Medicare Other

## 2023-10-22 ENCOUNTER — Encounter: Payer: Self-pay | Admitting: Internal Medicine

## 2023-10-24 ENCOUNTER — Encounter: Payer: Self-pay | Admitting: Internal Medicine

## 2023-10-24 ENCOUNTER — Ambulatory Visit (INDEPENDENT_AMBULATORY_CARE_PROVIDER_SITE_OTHER): Payer: Medicare Other | Admitting: Internal Medicine

## 2023-10-24 VITALS — BP 118/74 | HR 58 | Temp 98.7°F | Ht 66.0 in | Wt 200.0 lb

## 2023-10-24 DIAGNOSIS — M25521 Pain in right elbow: Secondary | ICD-10-CM | POA: Diagnosis not present

## 2023-10-24 DIAGNOSIS — Z96652 Presence of left artificial knee joint: Secondary | ICD-10-CM

## 2023-10-24 NOTE — Progress Notes (Signed)
   Subjective:   Patient ID: Desiree Snow, female    DOB: Oct 26, 1950, 73 y.o.   MRN: [id number]  HPI The patient is a 72 YO female coming in for pain right elbow and left and right knee after fall.   Review of Systems  Constitutional: Negative.   HENT: Negative.    Eyes: Negative.   Respiratory:  Negative for cough, chest tightness and shortness of breath.   Cardiovascular:  Negative for chest pain, palpitations and leg swelling.  Gastrointestinal:  Negative for abdominal distention, abdominal pain, constipation, diarrhea, nausea and vomiting.  Musculoskeletal:  Positive for arthralgias and myalgias.  Skin: Negative.   Neurological: Negative.   Psychiatric/Behavioral: Negative.      Objective:  Physical Exam Constitutional:      Appearance: She is well-developed.  HENT:     Head: Normocephalic and atraumatic.  Cardiovascular:     Rate and Rhythm: Normal rate and regular rhythm.  Pulmonary:     Effort: Pulmonary effort is normal. No respiratory distress.     Breath sounds: Normal breath sounds. No wheezing or rales.  Abdominal:     General: Bowel sounds are normal. There is no distension.     Palpations: Abdomen is soft.     Tenderness: There is no abdominal tenderness. There is no rebound.  Musculoskeletal:        General: Tenderness present.     Cervical back: Normal range of motion.     Comments: Right knee tender with mild bruising ACL/PCL intact. Left same and no instability of knee joint, right elbow with mild fullness no fracture detected. Mild pain.  Skin:    General: Skin is warm and dry.  Neurological:     Mental Status: She is alert and oriented to person, place, and time.     Coordination: Coordination normal.     Vitals:   10/24/23 1105  BP: 118/74  Pulse: (!) 58  Temp: 98.7 F (37.1 C)  TempSrc: Oral  SpO2: 99%  Weight: 200 lb (90.7 kg)  Height: 5\' 6"  (1.676 m)    Assessment & Plan:

## 2023-10-25 ENCOUNTER — Telehealth: Payer: Self-pay | Admitting: Internal Medicine

## 2023-10-25 ENCOUNTER — Other Ambulatory Visit: Payer: Self-pay | Admitting: Cardiology

## 2023-10-25 DIAGNOSIS — M25521 Pain in right elbow: Secondary | ICD-10-CM | POA: Insufficient documentation

## 2023-10-25 NOTE — Assessment & Plan Note (Signed)
Ordered right elbow x-ray given pain and concern. No fracture detected on exam. Change management as needed based on results. Discussed RICE.

## 2023-10-25 NOTE — Telephone Encounter (Unsigned)
Copied from CRM [id number]. Topic: Clinical - Request for Lab/Test Order >> Oct 25, 2023 11:51 AM Desiree Snow wrote: Reason for CRM: Patient would like to have X-Ray done on her right elbow. Please follow up with patient [phone], if patient does not answer call she can be reached via text but it might take her longer to respond on MyChart.

## 2023-10-25 NOTE — Assessment & Plan Note (Signed)
Checked out and no indication for imaging no problems detected. No instability of the knee and pain is improving gradually.

## 2023-10-28 ENCOUNTER — Ambulatory Visit (INDEPENDENT_AMBULATORY_CARE_PROVIDER_SITE_OTHER): Payer: Medicare Other

## 2023-10-28 DIAGNOSIS — S59901D Unspecified injury of right elbow, subsequent encounter: Secondary | ICD-10-CM | POA: Diagnosis not present

## 2023-10-28 DIAGNOSIS — M25521 Pain in right elbow: Secondary | ICD-10-CM

## 2023-10-28 DIAGNOSIS — D485 Neoplasm of uncertain behavior of skin: Secondary | ICD-10-CM | POA: Diagnosis not present

## 2023-10-28 DIAGNOSIS — B078 Other viral warts: Secondary | ICD-10-CM | POA: Diagnosis not present

## 2023-10-28 DIAGNOSIS — L821 Other seborrheic keratosis: Secondary | ICD-10-CM | POA: Diagnosis not present

## 2023-10-28 DIAGNOSIS — L813 Cafe au lait spots: Secondary | ICD-10-CM | POA: Diagnosis not present

## 2023-10-28 DIAGNOSIS — D1801 Hemangioma of skin and subcutaneous tissue: Secondary | ICD-10-CM | POA: Diagnosis not present

## 2023-10-28 NOTE — Telephone Encounter (Signed)
Ordered for her to come anytime to do right elbow

## 2023-10-30 ENCOUNTER — Encounter: Payer: Self-pay | Admitting: Internal Medicine

## 2023-11-15 ENCOUNTER — Telehealth: Payer: Self-pay | Admitting: Internal Medicine

## 2023-11-15 DIAGNOSIS — M25562 Pain in left knee: Secondary | ICD-10-CM | POA: Diagnosis not present

## 2023-11-15 NOTE — Telephone Encounter (Signed)
Copied from CRM [id number]. Topic: General - Other >> Nov 15, 2023  3:31 PM Desiree Snow wrote: Reason for CRM: Patient is requesting clinic to fax her elbow imaging results from 1/20 to [facility].

## 2023-11-18 DIAGNOSIS — S52134A Nondisplaced fracture of neck of right radius, initial encounter for closed fracture: Secondary | ICD-10-CM | POA: Diagnosis not present

## 2023-11-18 NOTE — Telephone Encounter (Signed)
I have fax this over

## 2023-11-21 ENCOUNTER — Other Ambulatory Visit: Payer: Self-pay | Admitting: Cardiology

## 2023-12-05 DIAGNOSIS — H52213 Irregular astigmatism, bilateral: Secondary | ICD-10-CM | POA: Diagnosis not present

## 2023-12-05 DIAGNOSIS — H25812 Combined forms of age-related cataract, left eye: Secondary | ICD-10-CM | POA: Diagnosis not present

## 2023-12-05 DIAGNOSIS — H53462 Homonymous bilateral field defects, left side: Secondary | ICD-10-CM | POA: Diagnosis not present

## 2023-12-05 DIAGNOSIS — H524 Presbyopia: Secondary | ICD-10-CM | POA: Diagnosis not present

## 2023-12-09 DIAGNOSIS — M65312 Trigger thumb, left thumb: Secondary | ICD-10-CM | POA: Diagnosis not present

## 2023-12-09 DIAGNOSIS — M25521 Pain in right elbow: Secondary | ICD-10-CM | POA: Diagnosis not present

## 2023-12-20 ENCOUNTER — Encounter: Payer: Self-pay | Admitting: Internal Medicine

## 2023-12-24 ENCOUNTER — Ambulatory Visit (INDEPENDENT_AMBULATORY_CARE_PROVIDER_SITE_OTHER): Payer: Medicare Other

## 2023-12-24 VITALS — BP 132/80 | HR 62 | Ht 65.0 in | Wt 199.2 lb

## 2023-12-24 DIAGNOSIS — Z122 Encounter for screening for malignant neoplasm of respiratory organs: Secondary | ICD-10-CM

## 2023-12-24 DIAGNOSIS — Z0000 Encounter for general adult medical examination without abnormal findings: Secondary | ICD-10-CM

## 2023-12-24 DIAGNOSIS — Z87891 Personal history of nicotine dependence: Secondary | ICD-10-CM | POA: Diagnosis not present

## 2023-12-24 NOTE — Patient Instructions (Addendum)
 Ms. Coker , Thank you for taking time to come for your Medicare Wellness Visit. I appreciate your ongoing commitment to your health goals. Please review the following plan we discussed and let me know if I can assist you in the future.   Referrals/Orders/Follow-Ups/Clinician Recommendations: Aim for 30 minutes of exercise or brisk walking, 6-8 glasses of water, and 5 servings of fruits and vegetables each day. Ordered a Lung Cancer Screening due to history of smoking.    This is a list of the screening recommended for you and due dates:  Health Maintenance  Topic Date Due   COVID-19 Vaccine (6 - 2024-25 season) 06/09/2023   Medicare Annual Wellness Visit  12/23/2024   Mammogram  06/23/2025   DTaP/Tdap/Td vaccine (2 - Td or Tdap) 04/13/2027   Colon Cancer Screening  04/21/2033   Pneumonia Vaccine  Completed   Flu Shot  Completed   DEXA scan (bone density measurement)  Completed   Hepatitis C Screening  Completed   Zoster (Shingles) Vaccine  Completed   HPV Vaccine  Aged Out    Advanced directives: (Copy Requested) Please bring a copy of your health care power of attorney and living will to the office to be added to your chart at your convenience. You can mail to [facility] [address] [place], [place] [address] or email to [email or web address]  Next Medicare Annual Wellness Visit scheduled for next year: Yes

## 2023-12-24 NOTE — Progress Notes (Cosign Needed)
 Subjective:   Desiree Snow is a 73 y.o. who presents for a Medicare Wellness preventive visit.  Visit Complete: In person  Persons Participating in Visit: Patient.  AWV Questionnaire: No: Patient Medicare AWV questionnaire was not completed prior to this visit.  Cardiac Risk Factors include: advanced age (>54men, >52 women);obesity (BMI >30kg/m2)     Objective:    Today's Vitals   12/24/23 1548  BP: 132/80  Pulse: 62  Weight: 199 lb 3.2 oz (90.4 kg)  Height: 5\' 5"  (1.651 m)   Body mass index is 33.15 kg/m.     12/24/2023    3:45 PM 08/25/2021    5:44 AM 08/18/2021    1:08 PM 09/20/2020    1:48 PM 09/14/2020    1:39 PM 03/01/2020   12:28 PM 02/22/2020    1:36 PM  Advanced Directives  Does Patient Have a Medical Advance Directive? Yes No No No No  No  Type of Estate agent of [place];Living will   Living will Living will    Does patient want to make changes to medical advance directive?    No - Patient declined No - Patient declined    Copy of Healthcare Power of Attorney in Chart? No - copy requested   No - copy requested No - copy requested    Would patient like information on creating a medical advance directive?  No - Patient declined  No - Patient declined  No - Patient declined No - Patient declined    Current Medications (verified) Outpatient Encounter Medications as of 12/24/2023  Medication Sig   apixaban (ELIQUIS) 5 MG TABS tablet Take 1 tablet (5 mg total) by mouth 2 (two) times daily.   Calcium Carbonate-Vit D-Min (CALCIUM 1200 PO) Take 1 tablet by mouth daily.   Cholecalciferol (VITAMIN D3) 1000 units CAPS Take 1,000 Units by mouth daily.   CINNAMON PO Take 100 mg by mouth at bedtime.   Collagen-Vitamin C-Biotin (COLLAGEN PO) Biocell collagen-take 1 tablet by mouth daily Does not have Vitamin C in it   diltiazem (CARDIZEM CD) 120 MG 24 hr capsule TAKE 1 CAPSULE BY MOUTH EVERY DAY   Flaxseed, Linseed, (FLAX SEED OIL PO) Take 1  capsule by mouth daily.   flecainide (TAMBOCOR) 100 MG tablet TAKE 1 TABLET BY MOUTH TWICE A DAY   lamoTRIgine (LAMICTAL) 100 MG tablet Take 1 tablet (100 mg total) by mouth 2 (two) times daily.   Magnesium 250 MG TABS Take 250 mg by mouth daily.   Multiple Vitamin (MULTIVITAMIN) tablet Take 1 tablet by mouth daily.   pantoprazole (PROTONIX) 20 MG tablet Take 20 mg by mouth daily. Prescribed by GI   TURMERIC PO Take 1 tablet by mouth daily.   [DISCONTINUED] famotidine (PEPCID) 20 MG tablet Take 20 mg by mouth daily.   clorazepate (TRANXENE) 3.75 MG tablet TAKE 1 TABLET EVERY DAY AS NEEDED FOR ANXIETY   No facility-administered encounter medications on file as of 12/24/2023.    Allergies (verified) Cetirizine, Kenalog [triamcinolone], Metronidazole, Quinolones, Scopolamine, and Penicillins   History: Past Medical History:  Diagnosis Date   Allergy    Anxiety    Arthritis    Blood transfusion without reported diagnosis    Cataract    Chicken pox    Colon polyps    Diverticulosis    Family history of uterine cancer 05/05/2021   GERD (gastroesophageal reflux disease)    H/O febrile seizure    as infant, x 1, List of AEDs  tried: Dilantin, Mysoline, Tegretol, Gabapentine, Depakote,  Keppra, and Lamictal,   Seizures (HCC)    "resolved" last seizure in 2002   Thyroid goiter    Past Surgical History:  Procedure Laterality Date   BREAST BIOPSY Right 07/29/2020   COMPLEX SCLEROSING LESION WITH ATYPICAL DUCTAL   BREAST BIOPSY Right 01/01/2020   COMPLEX SCLEROSING LESION    BREAST EXCISIONAL BIOPSY Right 09/20/2020   complex sclerosing   BREAST EXCISIONAL BIOPSY Right 03/01/2020   Favor IDC, CSL also considered   BREAST EXCISIONAL BIOPSY Left    ? Date  ?lipoma   BREAST LUMPECTOMY WITH RADIOACTIVE SEED LOCALIZATION Right 03/01/2020   Procedure: RIGHT BREAST LUMPECTOMY WITH RADIOACTIVE SEED LOCALIZATION;  Surgeon: Harriette Bouillon, MD;  Location: [facility];  Service:  General;  Laterality: Right;   BREAST LUMPECTOMY WITH RADIOACTIVE SEED LOCALIZATION Right 09/20/2020   Procedure: RIGHT BREAST LUMPECTOMY WITH RADIOACTIVE SEED LOCALIZATION;  Surgeon: Harriette Bouillon, MD;  Location: [facility];  Service: General;  Laterality: Right;   KNEE SURGERY  2021   LOBECTOMY FOR SEIZURE FOCUS  1998   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   MENISECTOMY Left    SPLENECTOMY  1961   TONSILLECTOMY AND ADENOIDECTOMY  1963   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   Procedure: TOTAL KNEE ARTHROPLASTY;  Surgeon: Beverely Low, MD;  Location: [facility];  Service: Orthopedics;  Laterality: Left;   TRIGGER FINGER RELEASE Right    middle   WISDOM TOOTH EXTRACTION     Family History  Problem Relation Age of Onset   Pneumonia Mother    Dementia Father    Uterine cancer Maternal Aunt        dx before 71   Diabetes Maternal Uncle        x 2   Hypertension Maternal Grandmother    Stroke Maternal Grandmother    Clotting disorder Paternal Grandmother    Dementia Paternal Grandfather    Lung cancer Cousin        paternal female cousin; dx after 69   Goiter Neg Hx    Thyroid cancer Neg Hx    Thyroid nodules Neg Hx    Thyroid disease Neg Hx    Colon cancer Neg Hx    Esophageal cancer Neg Hx    Rectal cancer Neg Hx    Stomach cancer Neg Hx    Social History   Socioeconomic History   Marital status: Single    Spouse name: Not on file   Number of children: 0   Years of education: Not on file   Highest education level: Master's degree (e.g., MA, MS, MEng, MEd, MSW, MBA)  Occupational History   Occupation: NP     Comment: NA  Tobacco Use   Smoking status: Former    Current packs/day: 0.00    Average packs/day: 2.0 packs/day for 29.0 years (58.0 ttl pk-yrs)    Types: Cigarettes    Start date: 10/08/1973    Quit date: 10/08/2002    Years since quitting: 21.2    Passive exposure: Past   Smokeless tobacco: Never  Vaping Use    Vaping status: Never Used  Substance and Sexual Activity   Alcohol use: No   Drug use: No   Sexual activity: Not on file  Other Topics Concern   Not on file  Social History Narrative   Lives alone   Caffeine- 2 c coffee   Social Drivers of Corporate investment banker Strain: Low Risk  (  12/24/2023)   Overall Financial Resource Strain (CARDIA)    Difficulty of Paying Living Expenses: Not hard at all  Food Insecurity: No Food Insecurity (12/24/2023)   Hunger Vital Sign    Worried About Running Out of Food in the Last Year: Never true    Ran Out of Food in the Last Year: Never true  Transportation Needs: No Transportation Needs (12/24/2023)   PRAPARE - Administrator, Civil Service (Medical): No    Lack of Transportation (Non-Medical): No  Physical Activity: Insufficiently Active (12/24/2023)   Exercise Vital Sign    Days of Exercise per Week: 1 day    Minutes of Exercise per Session: 60 min  Stress: No Stress Concern Present (12/24/2023)   [employer] of Occupational Health - Occupational Stress Questionnaire    Feeling of Stress : Not at all  Social Connections: Socially Isolated (12/24/2023)   Social Connection and Isolation Panel [NHANES]    Frequency of Communication with Friends and Family: More than three times a week    Frequency of Social Gatherings with Friends and Family: Once a week    Attends Religious Services: Never    Database administrator or Organizations: No    Attends Engineer, structural: Never    Marital Status: Never married    Tobacco Counseling Counseling given: No    Clinical Intake:  Pre-visit preparation completed: Yes  Pain : No/denies pain     BMI - recorded: 33.15 Nutritional Status: BMI > 30  Obese Nutritional Risks: None Diabetes: No  How often do you need to have someone help you when you read instructions, pamphlets, or other written materials from your doctor or pharmacy?: 1 - Never  Interpreter Needed?:  No  Information entered by :: Hassell Halim, CMA   Activities of Daily Living     12/24/2023    3:51 PM  In your present state of health, do you have any difficulty performing the following activities:  Hearing? 0  Vision? 0  Difficulty concentrating or making decisions? 0  Walking or climbing stairs? 0  Dressing or bathing? 0  Doing errands, shopping? 0  Preparing Food and eating ? N  Using the Toilet? N  In the past six months, have you accidently leaked urine? N  Do you have problems with loss of bowel control? N  Managing your Medications? N  Managing your Finances? N  Housekeeping or managing your Housekeeping? N    Patient Care Team: Myrlene Broker, MD as PCP - General (Internal Medicine) Jake Bathe, MD as PCP - Cardiology (Cardiology) Regan Lemming, MD as PCP - Electrophysiology (Cardiology) Malachy Mood, MD as Consulting Physician (Hematology) Harriette Bouillon, MD as Consulting Physician (General Surgery) [facility] as Consulting Physician (Radiology) [facility], [facility]hthalmology (Ophthalmology)  Indicate any recent Medical Services you may have received from other than [facility] providers in the past year (date may be approximate).     Assessment:   This is a routine wellness examination for Desiree Snow.  Hearing/Vision screen Hearing Screening - Comments:: Denies hearing difficulties   Vision Screening - Comments:: Wears rx glasses - up to date with routine eye exams with [facility]   Goals Addressed               This Visit's Progress     Increase physical activity (pt-stated)        Patient stated that she wants to increase activity and lose a little  weight.       Depression Screen     12/24/2023    3:56 PM 03/18/2023    2:41 PM 12/25/2017   10:30 AM  PHQ 2/9 Scores  PHQ - 2 Score 0 0 0  PHQ- 9 Score 0 0     Fall Risk     12/24/2023    3:53 PM 10/24/2023   11:11 AM 03/18/2023    2:41 PM  Fall Risk   Falls in  the past year? 1 0 0  Number falls in past yr: 1 0 0  Injury with Fall? 0 0 0  Follow up Falls evaluation completed;Falls prevention discussed Falls evaluation completed Falls evaluation completed    MEDICARE RISK AT HOME:  Medicare Risk at Home Any stairs in or around the home?: No If so, are there any without handrails?: No Home free of loose throw rugs in walkways, pet beds, electrical cords, etc?: Yes Adequate lighting in your home to reduce risk of falls?: Yes Life alert?: No Use of a cane, walker or w/c?: No Grab bars in the bathroom?: Yes Shower chair or bench in shower?: No Elevated toilet seat or a handicapped toilet?: No  TIMED UP AND GO:  Was the test performed?  No  Cognitive Function: 6CIT completed        12/24/2023    3:54 PM  6CIT Screen  What Year? 0 points  What month? 0 points  What time? 0 points  Count back from 20 0 points  Months in reverse 0 points  Repeat phrase 0 points  Total Score 0 points    Immunizations Immunization History  Administered Date(s) Administered   Fluad Quad(high Dose 65+) 07/14/2019   Influenza-Unspecified 07/21/2015, 06/28/2016, 05/31/2017, 06/08/2018, 07/25/2019, 07/23/2023   Moderna Covid-19 Vaccine Bivalent Booster 71yrs & up 01/08/2021   PFIZER(Purple Top)SARS-COV-2 Vaccination 11/16/2019, 12/11/2019, 07/01/2020   PPD Test 06/09/2021   Pfizer(Comirnaty)Fall Seasonal Vaccine 12 years and older 07/08/2022   Pneumococcal Conjugate-13 11/24/2015   Pneumococcal Polysaccharide-23 10/08/1996, 12/02/2016   Respiratory Syncytial Virus Vaccine,Recomb Aduvanted(Arexvy) 09/02/2022   Tdap 04/12/2017   Zoster Recombinant(Shingrix) 05/01/2018, 09/23/2018   Zoster, Live 12/13/2014    Screening Tests Health Maintenance  Topic Date Due   COVID-19 Vaccine (6 - 2024-25 season) 06/09/2023   Medicare Annual Wellness (AWV)  12/23/2024   MAMMOGRAM  06/23/2025   DTaP/Tdap/Td (2 - Td or Tdap) 04/13/2027   Colonoscopy  04/21/2033    Pneumonia Vaccine 44+ Years old  Completed   INFLUENZA VACCINE  Completed   DEXA SCAN  Completed   Hepatitis C Screening  Completed   Zoster Vaccines- Shingrix  Completed   HPV VACCINES  Aged Out    Health Maintenance  Health Maintenance Due  Topic Date Due   COVID-19 Vaccine (6 - 2024-25 season) 06/09/2023   Health Maintenance Items Addressed: 12/24/2023 Lung Cancer Screening ordered today.  Additional Screening:  Vision Screening: Recommended annual ophthalmology exams for early detection of glaucoma and other disorders of the eye. Pt stated is followed by [facility].  Dental Screening: Recommended annual dental exams for proper oral hygiene  Community Resource Referral / Chronic Care Management: CRR required this visit?  No   CCM required this visit?  No     Plan:     I have personally reviewed and noted the following in the patient's chart:   Medical and social history Use of alcohol, tobacco or illicit drugs  Current medications and supplements including opioid prescriptions. Patient is not  currently taking opioid prescriptions. Functional ability and status Nutritional status Physical activity Advanced directives List of other physicians Hospitalizations, surgeries, and ER visits in previous 12 months Vitals Screenings to include cognitive, depression, and falls Referrals and appointments  In addition, I have reviewed and discussed with patient certain preventive protocols, quality metrics, and best practice recommendations. A written personalized care plan for preventive services as well as general preventive health recommendations were provided to patient.     Darreld Mclean, CMA   12/24/2023   After Visit Summary: (MyChart) Due to this being a telephonic visit, the after visit summary with patients personalized plan was offered to patient via MyChart   Notes:  Ordered  Lung Cancer Screening test due to history of smoking (>71yrs).

## 2023-12-25 NOTE — Addendum Note (Signed)
 Addended by: Darreld Mclean on: 12/25/2023 11:59 AM   Modules accepted: Orders

## 2023-12-31 ENCOUNTER — Ambulatory Visit
Admission: RE | Admit: 2023-12-31 | Discharge: 2023-12-31 | Disposition: A | Discharge status: Home / Self Care | Payer: Medicare Other | Source: Ambulatory Visit | Attending: Nurse Practitioner | Admitting: Nurse Practitioner

## 2023-12-31 DIAGNOSIS — Z1231 Encounter for screening mammogram for malignant neoplasm of breast: Secondary | ICD-10-CM

## 2024-01-02 ENCOUNTER — Other Ambulatory Visit: Payer: Self-pay | Admitting: Nurse Practitioner

## 2024-01-02 DIAGNOSIS — N6320 Unspecified lump in the left breast, unspecified quadrant: Secondary | ICD-10-CM

## 2024-01-07 ENCOUNTER — Ambulatory Visit
Admission: RE | Admit: 2024-01-07 | Discharge: 2024-01-07 | Discharge status: Home / Self Care | Source: Ambulatory Visit | Attending: Nurse Practitioner

## 2024-01-07 ENCOUNTER — Ambulatory Visit (INDEPENDENT_AMBULATORY_CARE_PROVIDER_SITE_OTHER): Admitting: Internal Medicine

## 2024-01-07 ENCOUNTER — Encounter: Payer: Self-pay | Admitting: Internal Medicine

## 2024-01-07 ENCOUNTER — Encounter: Payer: Self-pay | Admitting: Nurse Practitioner

## 2024-01-07 ENCOUNTER — Ambulatory Visit
Admission: RE | Admit: 2024-01-07 | Discharge: 2024-01-07 | Disposition: A | Discharge status: Home / Self Care | Source: Ambulatory Visit | Attending: Nurse Practitioner | Admitting: Nurse Practitioner

## 2024-01-07 VITALS — BP 140/80 | HR 95 | Temp 97.9°F | Ht 65.0 in | Wt 201.0 lb

## 2024-01-07 DIAGNOSIS — N6320 Unspecified lump in the left breast, unspecified quadrant: Secondary | ICD-10-CM

## 2024-01-07 DIAGNOSIS — E042 Nontoxic multinodular goiter: Secondary | ICD-10-CM | POA: Diagnosis not present

## 2024-01-07 DIAGNOSIS — R7301 Impaired fasting glucose: Secondary | ICD-10-CM | POA: Diagnosis not present

## 2024-01-07 DIAGNOSIS — M79651 Pain in right thigh: Secondary | ICD-10-CM | POA: Diagnosis not present

## 2024-01-07 DIAGNOSIS — Z0000 Encounter for general adult medical examination without abnormal findings: Secondary | ICD-10-CM | POA: Insufficient documentation

## 2024-01-07 DIAGNOSIS — G40909 Epilepsy, unspecified, not intractable, without status epilepticus: Secondary | ICD-10-CM

## 2024-01-07 DIAGNOSIS — I480 Paroxysmal atrial fibrillation: Secondary | ICD-10-CM | POA: Diagnosis not present

## 2024-01-07 DIAGNOSIS — E2839 Other primary ovarian failure: Secondary | ICD-10-CM

## 2024-01-07 DIAGNOSIS — N6325 Unspecified lump in the left breast, overlapping quadrants: Secondary | ICD-10-CM | POA: Diagnosis not present

## 2024-01-07 LAB — CBC
HCT: 39.8 % (ref 36.0–46.0)
Hemoglobin: 13.1 g/dL (ref 12.0–15.0)
MCHC: 32.9 g/dL (ref 30.0–36.0)
MCV: 94 fl (ref 78.0–100.0)
Platelets: 347 10*3/uL (ref 150.0–400.0)
RBC: 4.23 Mil/uL (ref 3.87–5.11)
RDW: 13.5 % (ref 11.5–15.5)
WBC: 10.4 10*3/uL (ref 4.0–10.5)

## 2024-01-07 LAB — LIPID PANEL
Cholesterol: 163 mg/dL (ref 0–200)
HDL: 51.3 mg/dL (ref >39.00)
LDL Cholesterol: 73 mg/dL (ref 0–99)
NonHDL: 111.21
Total CHOL/HDL Ratio: 3
Triglycerides: 189 mg/dL — ABNORMAL HIGH (ref 0.0–149.0)
VLDL: 37.8 mg/dL (ref 0.0–40.0)

## 2024-01-07 LAB — COMPREHENSIVE METABOLIC PANEL WITH GFR
ALT: 14 U/L (ref 0–35)
AST: 17 U/L (ref 0–37)
Albumin: 4.1 g/dL (ref 3.5–5.2)
Alkaline Phosphatase: 77 U/L (ref 39–117)
BUN: 17 mg/dL (ref 6–23)
CO2: 29 meq/L (ref 19–32)
Calcium: 9.2 mg/dL (ref 8.4–10.5)
Chloride: 100 meq/L (ref 96–112)
Creatinine, Ser: 0.99 mg/dL (ref 0.40–1.20)
GFR: 56.48 mL/min — ABNORMAL LOW (ref >60.00)
Glucose, Bld: 102 mg/dL — ABNORMAL HIGH (ref 70–99)
Potassium: 4.1 meq/L (ref 3.5–5.1)
Sodium: 137 meq/L (ref 135–145)
Total Bilirubin: 0.3 mg/dL (ref 0.2–1.2)
Total Protein: 7.1 g/dL (ref 6.0–8.3)

## 2024-01-07 LAB — TSH: TSH: 0.89 u[IU]/mL (ref 0.35–5.50)

## 2024-01-07 LAB — HEMOGLOBIN A1C: Hgb A1c MFr Bld: 6.4 % (ref 4.6–6.5)

## 2024-01-07 LAB — VITAMIN B12: Vitamin B-12: 820 pg/mL (ref 211–911)

## 2024-01-07 LAB — VITAMIN D 25 HYDROXY (VIT D DEFICIENCY, FRACTURES): VITD: 50.8 ng/mL (ref 30.00–100.00)

## 2024-01-07 NOTE — Assessment & Plan Note (Signed)
 Referral to sports medicine. She did not have good experience previously and wishes to see another provider. This is very point tender and I suspect she needs [place] to assess muscle and tendons for chronic tear.

## 2024-01-07 NOTE — Patient Instructions (Addendum)
 We will check the labs today.  We will get the bone density scan.

## 2024-01-07 NOTE — Assessment & Plan Note (Signed)
 Checking CBC and CMP and TSH. Taking eliquis for stroke prevention and diltiazem and flecainide for rhythm control.

## 2024-01-07 NOTE — Assessment & Plan Note (Signed)
Checking TSH and adjust as needed.  

## 2024-01-07 NOTE — Assessment & Plan Note (Signed)
Checking HgA1c. 

## 2024-01-07 NOTE — Progress Notes (Signed)
   Subjective:   Patient ID: Desiree Snow, female    DOB: 1951/01/18, 73 y.o.   MRN: [id number]  HPI The patient is here for physical.  PMH, [facility], social history reviewed and updated  Review of Systems  Constitutional: Negative.   HENT: Negative.    Eyes: Negative.   Respiratory:  Negative for cough, chest tightness and shortness of breath.   Cardiovascular:  Negative for chest pain, palpitations and leg swelling.  Gastrointestinal:  Negative for abdominal distention, abdominal pain, constipation, diarrhea, nausea and vomiting.  Musculoskeletal: Negative.   Skin: Negative.   Neurological: Negative.   Psychiatric/Behavioral: Negative.      Objective:  Physical Exam Constitutional:      Appearance: She is well-developed. She is obese.  HENT:     Head: Normocephalic and atraumatic.  Cardiovascular:     Rate and Rhythm: Normal rate and regular rhythm.  Pulmonary:     Effort: Pulmonary effort is normal. No respiratory distress.     Breath sounds: Normal breath sounds. No wheezing or rales.  Abdominal:     General: Bowel sounds are normal. There is no distension.     Palpations: Abdomen is soft.     Tenderness: There is no abdominal tenderness. There is no rebound.  Musculoskeletal:     Cervical back: Normal range of motion.  Skin:    General: Skin is warm and dry.  Neurological:     Mental Status: She is alert and oriented to person, place, and time.     Coordination: Coordination normal.     Vitals:   01/07/24 1359  BP: (!) 140/80  Pulse: 95  Temp: 97.9 F (36.6 C)  TempSrc: Oral  SpO2: 96%  Weight: 201 lb (91.2 kg)  Height: 5\' 5"  (1.651 m)    Assessment & Plan:

## 2024-01-07 NOTE — Assessment & Plan Note (Signed)
 Checking lamictal level as none done historically to rule out high levels. We discussed normal or low is fine.No seizures.

## 2024-01-07 NOTE — Assessment & Plan Note (Signed)
 Flu shot up to date. Pneumonia complete. Shingrix complete. Tetanus up to date. Colonoscopy up to date. Mammogram up to date, pap smear aged out and dexa ordered. Counseled about sun safety and mole surveillance. Counseled about the dangers of distracted driving. Given 10 year screening recommendations.

## 2024-01-10 ENCOUNTER — Encounter: Payer: Self-pay | Admitting: Internal Medicine

## 2024-01-11 LAB — LAMOTRIGINE LEVEL: Lamotrigine Lvl: 3 ug/mL (ref 2.5–15.0)

## 2024-01-13 ENCOUNTER — Encounter: Payer: Self-pay | Admitting: Internal Medicine

## 2024-01-13 ENCOUNTER — Ambulatory Visit (INDEPENDENT_AMBULATORY_CARE_PROVIDER_SITE_OTHER): Admitting: Family Medicine

## 2024-01-13 VITALS — BP 140/63 | Ht 65.5 in | Wt 197.0 lb

## 2024-01-13 DIAGNOSIS — M25551 Pain in right hip: Secondary | ICD-10-CM | POA: Diagnosis not present

## 2024-01-13 NOTE — Progress Notes (Unsigned)
 PCP: Norton Blizzard, MD  Subjective:   HPI: Patient is a 73 y.o. female here for medial right thigh pain. Pain initially began in 2019 and has progressively worsened over time. The patient attributes the onset of pain to sitting in an awkward position with her legs crossed, which she believes caused a muscle strain. Any active movement involving the right adductor muscles triggers the pain, including walking and stretching. The pain is described as sharp and burning, but does not radiate to other areas. The patient has only used Tylenol for relief and has not tried other treatments.   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 180 tablet 3   Calcium Carbonate-Vit D-Min (CALCIUM 1200 PO) Take 1 tablet by mouth daily.     Cholecalciferol (VITAMIN D3) 1000 units CAPS Take 1,000 Units by mouth daily.     CINNAMON PO Take 100 mg by mouth at bedtime.     clorazepate (TRANXENE) 3.75 MG tablet TAKE 1 TABLET EVERY DAY AS NEEDED FOR ANXIETY (Patient not taking: Reported on 01/07/2024) 30 tablet 2   Collagen-Vitamin C-Biotin (COLLAGEN PO) Biocell collagen-take 1 tablet by mouth daily Does not have Vitamin C in it     diltiazem (CARDIZEM CD) 120 MG 24 hr capsule TAKE 1  CAPSULE BY MOUTH EVERY DAY 30 capsule 7   Flaxseed, Linseed, (FLAX SEED OIL PO) Take 1 capsule by mouth daily.     flecainide (TAMBOCOR) 100 MG tablet TAKE 1 TABLET BY MOUTH TWICE A DAY 180 tablet 0   lamoTRIgine (LAMICTAL) 100 MG tablet Take 1 tablet (100 mg total) by mouth 2 (two) times daily. 180 tablet 4   Magnesium 250 MG TABS Take 250 mg by mouth daily.     Multiple Vitamin (MULTIVITAMIN) tablet Take 1 tablet by mouth daily.     pantoprazole (PROTONIX) 20 MG tablet Take 20 mg by mouth daily. Prescribed by GI     TURMERIC PO Take 1 tablet by mouth daily.     No current facility-administered medications on file prior to visit.    Past Surgical History:  Procedure Laterality Date   BRAIN SURGERY  1998   Right Amygdalohippocampectomy   BREAST BIOPSY Right 07/29/2020   COMPLEX SCLEROSING LESION WITH ATYPICAL DUCTAL   BREAST BIOPSY Right 01/01/2020   COMPLEX SCLEROSING LESION    BREAST EXCISIONAL BIOPSY Right 09/20/2020   complex sclerosing   BREAST EXCISIONAL BIOPSY Right 03/01/2020   Favor IDC, CSL also considered   BREAST EXCISIONAL BIOPSY Left    ? Date  ?lipoma   BREAST LUMPECTOMY WITH RADIOACTIVE SEED LOCALIZATION Right 03/01/2020   Procedure: RIGHT  BREAST LUMPECTOMY WITH RADIOACTIVE SEED LOCALIZATION;  Surgeon: Harriette Bouillon, MD;  Location: [facility];  Service: General;  Laterality: Right;   BREAST LUMPECTOMY WITH RADIOACTIVE SEED LOCALIZATION Right 09/20/2020   Procedure: RIGHT BREAST LUMPECTOMY WITH RADIOACTIVE SEED LOCALIZATION;  Surgeon: Harriette Bouillon, MD;  Location: [facility];  Service: General;  Laterality: Right;   COSMETIC SURGERY  2012   Lifestyle Lift   EYE SURGERY     right cataract removal 09/18/23   JOINT REPLACEMENT  08/2021   Left total knee replacement   KNEE SURGERY  2021   LOBECTOMY FOR SEIZURE FOCUS  1998   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   MENISECTOMY Left    SPLENECTOMY  1961    TONSILLECTOMY AND ADENOIDECTOMY  1963   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   Procedure: TOTAL KNEE ARTHROPLASTY;  Surgeon: Beverely Low, MD;  Location: [facility];  Service: Orthopedics;  Laterality: Left;   TRIGGER FINGER RELEASE Right    middle   WISDOM TOOTH EXTRACTION      Allergies  Allergen Reactions   Cetirizine Other (See Comments)    Cognitive issues and "lowers seizure threshold."   Kenalog [Triamcinolone]     Felt very strange   Metronidazole Other (See Comments)    Strange feeling and inability to function   Quinolones    Scopolamine Hives     on her fingers no itchng   Penicillins Rash    BP (!) 140/63   Ht 5' 5.5" (1.664 m)   Wt 197 lb (89.4 kg)   BMI 32.28 kg/m       No data to display              No data to display              Objective:  Physical Exam:  Gen: NAD, comfortable in exam room MSK:  Right thigh: no bruising or swelling. TTP at right medial adductor muscles. Positive FABER test. Pain elicited with resisted adduction.   Neuro: Sensation and motor function intact.    Assessment & Plan:  The patient presents with persistent muscle/soft tissue pain localized to the right adductor muscles, likely due to a chronic strain. No previous workup has been performed to fully evaluate the extent of the injury. As such, cannot rule out obturator nerve entrapment at this time and should be considered in the differential diagnosis due to the location and nature of the pain. Fractures unlikely given the chronic nature of pain and lack of trauma history.  Plan:  - Obtain ultrasound and/or MRI of the hip to evaluate the adductor muscles and surrounding structures.  - Continue Tylenol as needed for pain relief.  - Depending on imaging findings may benefit from physical therapy to strengthen the adductor muscles, improve flexibility, and prevent further strain.  - Follow-up to review imaging results and reassess symptoms after imaging is completed.

## 2024-01-13 NOTE — Patient Instructions (Signed)
 [facility] Address: [address], [place], [place] [address] Phone: [phone]

## 2024-01-14 ENCOUNTER — Encounter: Payer: Self-pay | Admitting: Family Medicine

## 2024-01-14 DIAGNOSIS — H25812 Combined forms of age-related cataract, left eye: Secondary | ICD-10-CM | POA: Diagnosis not present

## 2024-01-14 DIAGNOSIS — H268 Other specified cataract: Secondary | ICD-10-CM | POA: Diagnosis not present

## 2024-01-14 DIAGNOSIS — H2512 Age-related nuclear cataract, left eye: Secondary | ICD-10-CM | POA: Diagnosis not present

## 2024-01-14 HISTORY — PX: EYE SURGERY: SHX253

## 2024-01-16 ENCOUNTER — Telehealth: Payer: Self-pay | Admitting: Cardiology

## 2024-01-16 NOTE — Telephone Encounter (Signed)
*  STAT* If patient is at the pharmacy, call can be transferred to refill team.   1. Which medications need to be refilled? (please list name of each medication and dose if known)   flecainide (TAMBOCOR) 100 MG tablet    2. Which pharmacy/location (including street and city if local pharmacy) is medication to be sent to?  [id number] - [place], [place] - [address]    3. Do they need a 30 day or 90 day supply? 90   Has appt on 5/27

## 2024-01-17 ENCOUNTER — Ambulatory Visit (HOSPITAL_BASED_OUTPATIENT_CLINIC_OR_DEPARTMENT_OTHER)
Admission: RE | Admit: 2024-01-17 | Discharge: 2024-01-17 | Disposition: A | Discharge status: Home / Self Care | Source: Ambulatory Visit | Attending: Family Medicine | Admitting: Family Medicine

## 2024-01-17 DIAGNOSIS — M25551 Pain in right hip: Secondary | ICD-10-CM | POA: Insufficient documentation

## 2024-01-17 DIAGNOSIS — R609 Edema, unspecified: Secondary | ICD-10-CM | POA: Diagnosis not present

## 2024-01-17 DIAGNOSIS — M79651 Pain in right thigh: Secondary | ICD-10-CM | POA: Diagnosis not present

## 2024-01-17 MED ORDER — FLECAINIDE ACETATE 100 MG PO TABS: 100.0000 mg | ORAL_TABLET | Freq: Two times a day (BID) | ORAL | 1 refills | Status: DC

## 2024-01-17 NOTE — Telephone Encounter (Signed)
 Pt's medication was sent to pt's pharmacy as requested. Confirmation received.

## 2024-01-21 ENCOUNTER — Encounter: Payer: Self-pay | Admitting: Family Medicine

## 2024-01-21 ENCOUNTER — Encounter: Payer: Self-pay | Admitting: Sports Medicine

## 2024-01-27 ENCOUNTER — Ambulatory Visit (INDEPENDENT_AMBULATORY_CARE_PROVIDER_SITE_OTHER)
Admission: RE | Admit: 2024-01-27 | Discharge: 2024-01-27 | Disposition: A | Discharge status: Home / Self Care | Source: Ambulatory Visit | Attending: Internal Medicine | Admitting: Internal Medicine

## 2024-01-27 ENCOUNTER — Inpatient Hospital Stay: Admission: RE | Admit: 2024-01-27 | Source: Ambulatory Visit

## 2024-01-27 ENCOUNTER — Encounter: Payer: Self-pay | Admitting: Family Medicine

## 2024-01-27 ENCOUNTER — Ambulatory Visit (INDEPENDENT_AMBULATORY_CARE_PROVIDER_SITE_OTHER): Admitting: Family Medicine

## 2024-01-27 VITALS — BP 130/74 | Ht 65.5 in | Wt 197.0 lb

## 2024-01-27 DIAGNOSIS — E2839 Other primary ovarian failure: Secondary | ICD-10-CM | POA: Diagnosis not present

## 2024-01-27 DIAGNOSIS — M25551 Pain in right hip: Secondary | ICD-10-CM

## 2024-01-27 MED ORDER — MELOXICAM 15 MG PO TABS: 15.0000 mg | ORAL_TABLET | Freq: Every day | ORAL | 2 refills | Status: DC

## 2024-01-27 NOTE — Patient Instructions (Signed)
 Start physical therapy for obturator muscle strain at the [place] location. Do home exercises on days you don't go to therapy. Meloxicam  15mg  daily with food - take for 7-10 days then as needed. Follow up with me in 6 weeks for reevaluation.

## 2024-01-28 DIAGNOSIS — M25561 Pain in right knee: Secondary | ICD-10-CM | POA: Diagnosis not present

## 2024-01-28 DIAGNOSIS — Z96652 Presence of left artificial knee joint: Secondary | ICD-10-CM | POA: Diagnosis not present

## 2024-01-28 DIAGNOSIS — M1712 Unilateral primary osteoarthritis, left knee: Secondary | ICD-10-CM | POA: Diagnosis not present

## 2024-01-28 NOTE — Progress Notes (Signed)
 MRI reviewed and discussed with patient.  She does have evidence of obturator externus muscle strain/inflammation on right more than left.  May have local nerve irritation with this as well contributing to why this has persisted for years.  Based on this concurrent with exam will refer to physical therapy.  Meloxicam  daily with food.  Follow up in 6 weeks for reevaluation.  Other findings on MRI not felt to be contributing to her pain at this time.

## 2024-01-29 ENCOUNTER — Other Ambulatory Visit: Payer: Self-pay | Admitting: Gastroenterology

## 2024-01-29 DIAGNOSIS — K253 Acute gastric ulcer without hemorrhage or perforation: Secondary | ICD-10-CM

## 2024-01-29 NOTE — Telephone Encounter (Signed)
 By Dr Revonda Castles EGD report it said for patient to take Pantoprazole   for only 8 weeks. She has not been seen in the office since 04/2023

## 2024-01-30 ENCOUNTER — Encounter: Payer: Self-pay | Admitting: Internal Medicine

## 2024-01-30 LAB — HM DEXA SCAN: HM Dexa Scan: 1.1

## 2024-02-03 DIAGNOSIS — M65312 Trigger thumb, left thumb: Secondary | ICD-10-CM | POA: Diagnosis not present

## 2024-02-03 DIAGNOSIS — M25521 Pain in right elbow: Secondary | ICD-10-CM | POA: Diagnosis not present

## 2024-02-17 ENCOUNTER — Encounter: Payer: Self-pay | Admitting: Adult Health

## 2024-02-17 ENCOUNTER — Ambulatory Visit: Payer: Medicare Other | Admitting: Adult Health

## 2024-02-17 VITALS — BP 122/78 | HR 84 | Ht 65.5 in | Wt 197.0 lb

## 2024-02-17 DIAGNOSIS — G40909 Epilepsy, unspecified, not intractable, without status epilepticus: Secondary | ICD-10-CM

## 2024-02-17 DIAGNOSIS — G459 Transient cerebral ischemic attack, unspecified: Secondary | ICD-10-CM | POA: Diagnosis not present

## 2024-02-17 MED ORDER — LAMOTRIGINE 100 MG PO TABS: 100.0000 mg | ORAL_TABLET | Freq: Two times a day (BID) | ORAL | 4 refills | Status: AC

## 2024-02-17 NOTE — Patient Instructions (Signed)
 Continue lamotrigine  100mg  twice daily for seizure prevention  Please call with any additional seizure activity     Follow up in 1 year or call earlier if needed

## 2024-02-17 NOTE — Progress Notes (Signed)
 [facility]  PATIENT: Desiree Snow DOB: 30-Jun-1951  REFERRING CLINICIAN: [name], Kristina Pfeiffer, MD HISTORY FROM: patient  REASON FOR VISIT: follow up   HISTORICAL  CHIEF COMPLAINT:  Chief Complaint  Patient presents with   Follow-up    Rm 3, pt alone, rm 3. She had a seizure on 12/10. Was given eye gtts for upcoming procedure. 1st sz in several years.     HISTORY OF PRESENT ILLNESS:   Update 02/17/2024 JM: patient returns for follow up visit. Reports seizure 09/16/2024, she believes seizure occurred due to use of 3 different eyes drops she was instructed to use prior to cataract surgery which has a risk of lowering seizure threshold.  She did pursue initial cataract surgery but never pursued second surgery for other eye as she was not willing to use same eyedrops prior to second surgery.  She has since established care with new ophthalmologist and underwent cataract surgery in April, was able to use different eye drops without any difficulty.  She remains on lamotrigine  100 mg twice daily which she is tolerating well.  She completed cardiac monitor after prior report of transient aphasia which showed atrial fibrillation and now on Eliquis  and is closely being followed by cardiology.  Denies any reoccurrence or new stroke/TIA symptoms.  She continues to closely follow with PCP.     UPDATE (02/11/23, VRP): Since last visit, doing well, except had 1 event of transient aphasia (30 seconds of word finding difficulty; no LOC; no weakness or numbness). No other events. No seizures. Had MRI, MRA.   UPDATE (02/05/22, VRP): Since last visit, doing well. Symptoms are stable. Lamotrigine  stable. Had knee surgery Nov 2022 and doing well.   PRIOR HPI (02/01/21): 73 year old female here for evaluation of seizure disorder.  Patient had onset of seizures in 1963, diagnosed in 1965.  At that time patient was living in [place].  She was having weird sensations initially and then had  complex partial seizures where she lost awareness.  She would still continue to be able to walk and move but would have memory lapse.  She was started on Dilantin, primidone, Tegretol without relief.  Patient continued to have seizures at least >25 times per month.  Eventually she had video EEG monitoring and underwent epilepsy surgery in [facility] .  Since that time patient has done extremely well and has not had any major seizures.  She rarely has brief ROS but does not have alteration of consciousness.  Patient was maintained on Keppra and Lamictal  for a while after her seizure surgery, and then reduce to Keppra alone.  She noticed some issues with mood and therefore transitioned from Keppra to Lamictal  in 2021.  Since that time patient is doing well.  Patient previously was seeing neurologist in [place]  but requested transfer to care.  [place] where she lives.  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  She is currently not working but is looking for employment.   REVIEW OF SYSTEMS: Full 14 system review of systems performed and negative with exception of: As per HPI.  ALLERGIES: Allergies  Allergen Reactions   Cetirizine  Other (See Comments)    Cognitive issues and "lowers seizure threshold."   Kenalog [Triamcinolone ]     Felt very strange   Metronidazole  Other (See Comments)    Strange feeling and inability to function   Quinolones  Scopolamine Hives     on her fingers no itchng   Penicillins Rash    HOME MEDICATIONS: Outpatient Medications Prior to Visit  Medication Sig Dispense Refill   apixaban  (ELIQUIS ) 5 MG TABS tablet Take 1 tablet (5 mg total) by mouth 2 (two) times daily. 180 tablet 3   Calcium  Carbonate-Vit D-Min (CALCIUM  1200 PO) Take 1 tablet by mouth daily.     Cholecalciferol  (VITAMIN D3) 1000 units CAPS  Take 1,000 Units by mouth daily.     CINNAMON  PO Take 100 mg by mouth at bedtime.     Coenzyme Q10 (CO Q-10) 100 MG CAPS Take 100 mg by mouth at bedtime.     Collagen-Vitamin C-Biotin (COLLAGEN PO) Biocell collagen-take 1 tablet by mouth daily Does not have Vitamin C in it     diltiazem  (CARDIZEM  CD) 120 MG 24 hr capsule TAKE 1 CAPSULE BY MOUTH EVERY DAY 30 capsule 7   famotidine  (PEPCID ) 20 MG tablet Take 20 mg by mouth daily.     Flaxseed, Linseed, (FLAX SEED OIL PO) Take 1 capsule by mouth daily.     flecainide  (TAMBOCOR ) 100 MG tablet Take 1 tablet (100 mg total) by mouth 2 (two) times daily. 180 tablet 1   Magnesium  250 MG TABS Take 250 mg by mouth daily.     meloxicam  (MOBIC ) 15 MG tablet Take 1 tablet (15 mg total) by mouth daily. 30 tablet 2   Multiple Vitamin (MULTIVITAMIN) tablet Take 1 tablet by mouth daily.     pantoprazole  (PROTONIX ) 20 MG tablet Take 20 mg by mouth daily. Prescribed by GI     TURMERIC PO Take 1 tablet by mouth daily.     lamoTRIgine  (LAMICTAL ) 100 MG tablet Take 1 tablet (100 mg total) by mouth 2 (two) times daily. 180 tablet 4   clorazepate  (TRANXENE ) 3.75 MG tablet TAKE 1 TABLET EVERY DAY AS NEEDED FOR ANXIETY (Patient not taking: Reported on 01/07/2024) 30 tablet 2   No facility-administered medications prior to visit.    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   Diverticulosis    Family history of uterine cancer 05/05/2021   GERD (gastroesophageal reflux disease)    H/O febrile seizure    as infant, x 1, List of AEDs tried: Dilantin, Mysoline, Tegretol, Gabapentine, Depakote,  Keppra, and Lamictal ,   Seizures (HCC)    "resolved" last seizure in 2002   Thyroid  goiter    Ulcer    small gastric ulcers found during endoscopy    PAST  SURGICAL HISTORY: Past Surgical History:  Procedure Laterality Date   BRAIN SURGERY  1998   Right Amygdalohippocampectomy   BREAST BIOPSY Right 07/29/2020   COMPLEX SCLEROSING LESION WITH ATYPICAL DUCTAL   BREAST BIOPSY Right 01/01/2020   COMPLEX SCLEROSING LESION    BREAST EXCISIONAL BIOPSY Right 09/20/2020   complex sclerosing   BREAST EXCISIONAL BIOPSY Right 03/01/2020   Favor IDC, CSL also considered   BREAST EXCISIONAL BIOPSY Left    ? Date  ?lipoma   BREAST LUMPECTOMY WITH RADIOACTIVE SEED LOCALIZATION Right 03/01/2020   Procedure: RIGHT BREAST LUMPECTOMY WITH RADIOACTIVE SEED LOCALIZATION;  Surgeon: Sim Dryer, MD;  Location: [facility];  Service: General;  Laterality: Right;  BREAST LUMPECTOMY WITH RADIOACTIVE SEED LOCALIZATION Right 09/20/2020   Procedure: RIGHT BREAST LUMPECTOMY WITH RADIOACTIVE SEED LOCALIZATION;  Surgeon: Sim Dryer, MD;  Location: [facility];  Service: General;  Laterality: Right;   COSMETIC SURGERY  2012   Lifestyle Lift   EYE SURGERY     right cataract removal 09/18/23   EYE SURGERY Left 01/14/2024   cataract removal   JOINT REPLACEMENT  08/2021   Left total knee replacement   KNEE SURGERY  2021   LOBECTOMY FOR SEIZURE FOCUS  1998   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   MENISECTOMY Left    SPLENECTOMY  1961   TONSILLECTOMY AND ADENOIDECTOMY  1963   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   Procedure: TOTAL KNEE ARTHROPLASTY;  Surgeon: Winston Hawking, MD;  Location: [facility];  Service: Orthopedics;  Laterality: Left;   TRIGGER FINGER RELEASE Right    middle   WISDOM TOOTH EXTRACTION      FAMILY HISTORY: Family History  Problem Relation Age of Onset   Pneumonia Mother    Alcohol abuse Mother    Early death Mother    Dementia Father    Uterine cancer Maternal Aunt        dx before 59   Vision loss Maternal Aunt    Diabetes Maternal Uncle        x 2   Hypertension Maternal  Grandmother    Stroke Maternal Grandmother    Clotting disorder Paternal Grandmother    Dementia Paternal Grandfather    Lung cancer Cousin        paternal female cousin; dx after 29   Cancer Maternal Aunt    Goiter Neg Hx    Thyroid  cancer Neg Hx    Thyroid  nodules Neg Hx    Thyroid  disease Neg Hx    Colon cancer Neg Hx    Esophageal cancer Neg Hx    Rectal cancer Neg Hx    Stomach cancer Neg Hx     SOCIAL HISTORY: Social History   Socioeconomic History   Marital status: Single    Spouse name: Not on file   Number of children: 0   Years of education: Not on file   Highest education level: Master's degree (e.g., MA, MS, MEng, MEd, MSW, MBA)  Occupational History   Occupation: NP     Comment: NA  Tobacco Use   Smoking status: Former    Current packs/day: 0.00    Average packs/day: 2.0 packs/day for 29.0 years (58.0 ttl pk-yrs)    Types: Cigarettes    Start date: 10/08/1973    Quit date: 12/10/2002    Years since quitting: 21.2    Passive exposure: Past   Smokeless tobacco: Never   Tobacco comments:    Decided I wanted to continue to breathe  Vaping Use   Vaping status: Never Used  Substance and Sexual Activity   Alcohol use: No   Drug use: No   Sexual activity: Not Currently    Birth control/protection: Post-menopausal  Other Topics Concern   Not on file  Social History Narrative   Lives alone   Caffeine- 2 c coffee   Social Drivers of Corporate investment banker Strain: Low Risk  (12/24/2023)   Overall Financial Resource Strain (CARDIA)    Difficulty of Paying Living Expenses: Not hard at all  Food Insecurity: No Food Insecurity (12/24/2023)   Hunger Vital Sign    Worried About Running Out of Food in the Last Year: Never  true    Ran Out of Food in the Last Year: Never true  Transportation Needs: No Transportation Needs (12/24/2023)   PRAPARE - Administrator, Civil Service (Medical): No    Lack of Transportation (Non-Medical): No  Physical  Activity: Insufficiently Active (12/24/2023)   Exercise Vital Sign    Days of Exercise per Week: 1 day    Minutes of Exercise per Session: 60 min  Stress: No Stress Concern Present (12/24/2023)   [employer] of Occupational Health - Occupational Stress Questionnaire    Feeling of Stress : Not at all  Social Connections: Socially Isolated (12/24/2023)   Social Connection and Isolation Panel [NHANES]    Frequency of Communication with Friends and Family: More than three times a week    Frequency of Social Gatherings with Friends and Family: Once a week    Attends Religious Services: Never    Database administrator or Organizations: No    Attends Banker Meetings: Never    Marital Status: Never married  Intimate Partner Violence: Not At Risk (12/24/2023)   Humiliation, Afraid, Rape, and Kick questionnaire    Fear of Current or Ex-Partner: No    Emotionally Abused: No    Physically Abused: No    Sexually Abused: No     PHYSICAL EXAM  GENERAL EXAM/CONSTITUTIONAL: Vitals:  Vitals:   02/17/24 1506  BP: 122/78  Pulse: 84  Weight: 197 lb (89.4 kg)  Height: 5' 5.5" (1.664 m)   Patient is in no distress; well developed, nourished and groomed; neck is supple  CARDIOVASCULAR: Regular rate and rhythm, no murmurs  MUSCULOSKELETAL: Gait, strength, tone, movements noted in Neurologic exam below  NEUROLOGIC: MENTAL STATUS:  awake, alert, oriented to person, place and time recent and remote memory intact normal attention and concentration language fluent, comprehension intact, naming intact fund of knowledge appropriate  CRANIAL NERVE:  2nd, 3rd, 4th, 6th - pupils equal and reactive to light, visual fields full to confrontation (subjectively decreased peripheral vision bilaterally per her ophthalmologist), extraocular muscles intact, no nystagmus 5th - facial sensation symmetric 7th - facial strength symmetric 8th - hearing intact 9th - palate elevates  symmetrically, uvula midline 11th - shoulder shrug symmetric 12th - tongue protrusion midline  MOTOR:  normal bulk and tone, full strength in the BUE, BLE  GAIT/STATION:  Stands from seated position without difficulty.  Narrow based gait, adequate stride length and step height bilaterally.  Able to walk on heels and toes without difficulty.     DIAGNOSTIC DATA (LABS, IMAGING, TESTING) - I reviewed patient records, labs, notes, testing and imaging myself where available.  Lab Results  Component Value Date   WBC 10.4 01/07/2024   HGB 13.1 01/07/2024   HCT 39.8 01/07/2024   MCV 94.0 01/07/2024   PLT 347.0 01/07/2024      Component Value Date/Time   NA 137 01/07/2024 1506   NA 139 07/08/2023 1054   K 4.1 01/07/2024 1506   CL 100 01/07/2024 1506   CO2 29 01/07/2024 1506   GLUCOSE 102 (H) 01/07/2024 1506   BUN 17 01/07/2024 1506   BUN 14 07/08/2023 1054   CREATININE 0.99 01/07/2024 1506   CREATININE 0.99 04/23/2022 1229   CALCIUM  9.2 01/07/2024 1506   PROT 7.1 01/07/2024 1506   ALBUMIN 4.1 01/07/2024 1506   AST 17 01/07/2024 1506   AST 15 04/23/2022 1229   ALT 14 01/07/2024 1506   ALT 12 04/23/2022 1229   ALKPHOS 77 01/07/2024  1506   BILITOT 0.3 01/07/2024 1506   BILITOT 0.4 04/23/2022 1229   GFRNONAA >60 04/23/2022 1229   GFRAA >60 02/26/2020 1200   Lab Results  Component Value Date   CHOL 163 01/07/2024   HDL 51.30 01/07/2024   LDLCALC 73 01/07/2024   TRIG 189.0 (H) 01/07/2024   CHOLHDL 3 01/07/2024   Lab Results  Component Value Date   HGBA1C 6.4 01/07/2024   Lab Results  Component Value Date   VITAMINB12 820 01/07/2024   Lab Results  Component Value Date   TSH 0.89 01/07/2024    09/18/19 MRI brain 1. Right temporal lobectomy without evidence for residual or recurrent tumor. 2. Otherwise normal MRI appearance of the brain.  04/25/22 MRI brain (without) demonstrating: -Stable right temporal lobectomy and expected postoperative changes. -No acute  findings.  No change from 09/18/2019.  04/25/22 MRA head / neck - normal   ASSESSMENT AND PLAN  74 y.o. year old female here with:  Dx:  1. Seizure disorder (HCC)   2. 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 Samara Crest although her seizures are overall well controlled but due to her history, she does believe she needs to be seen by a MD that specializes in seizures.  She will further look into this and call if interested in transferring, she was advised both Dr. Salli Crawley and Dr. Samara Crest will need to approve this request prior, she verbalized understanding  SUSPECT TIA; TRANSIENT APHASIA (30 seconds; 03/30/22) - likely due to A fib as noted on cardiac monitor  - now on Eliquis  and closely followed by cardiology  -discussed continued close PCP f/u for aggressive stroke risk factor management -Stroke labs 01/2024: A1c 6.4, LDL 73   No orders of the defined types were placed in this encounter.  Meds ordered this encounter  Medications   lamoTRIgine  (LAMICTAL ) 100 MG tablet    Sig: Take 1 tablet (100 mg total) by mouth 2 (two) times daily.    Dispense:  180 tablet    Refill:  4   Return in about 1 year (around 02/16/2025).     I spent 25 minutes of face-to-face and non-face-to-face time with patient.  This included previsit chart review, lab review, study review, order entry, electronic health record documentation, patient education and discussion regarding above diagnoses and  treatment plan and answered all other questions to patient's satisfaction  Johny Nap, [facility]  [facility] [address] [place], [place] [address]  Phone [phone] Fax [phone] Note: This document was prepared with digital dictation and possible smart phrase technology. Any transcriptional errors that result from this process are unintentional.

## 2024-02-27 NOTE — Progress Notes (Unsigned)
 Cardiology Clinic Note   Patient Name: Desiree Snow Date of Encounter: 03/03/2024  Primary Care Provider:  Adelia Homestead, MD Primary Cardiologist:  Dorothye Gathers, MD  Patient Profile    Desiree Snow 73 year old female presents to the clinic today for follow-up evaluation of her paroxysmal atrial fibrillation and DOE .  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   Diverticulosis    Family history of uterine cancer 05/05/2021   GERD (gastroesophageal reflux disease)    H/O febrile seizure    as infant, x 1, List of AEDs tried: Dilantin, Mysoline, Tegretol, Gabapentine, Depakote,  Keppra, and Lamictal ,   Seizures (HCC)    "resolved" last seizure in 2002   Thyroid  goiter    Ulcer    small gastric ulcers found during endoscopy   Past Surgical History:  Procedure Laterality Date   BRAIN SURGERY  1998   Right Amygdalohippocampectomy   BREAST BIOPSY Right 07/29/2020   COMPLEX SCLEROSING LESION WITH ATYPICAL DUCTAL   BREAST BIOPSY Right 01/01/2020   COMPLEX SCLEROSING LESION    BREAST EXCISIONAL BIOPSY Right 09/20/2020   complex sclerosing   BREAST EXCISIONAL BIOPSY Right 03/01/2020   Favor IDC, CSL also considered   BREAST EXCISIONAL BIOPSY Left    ? Date  ?lipoma   BREAST LUMPECTOMY WITH RADIOACTIVE SEED LOCALIZATION Right 03/01/2020   Procedure: RIGHT BREAST LUMPECTOMY WITH RADIOACTIVE SEED LOCALIZATION;  Surgeon: Sim Dryer, MD;  Location: [facility];  Service: General;  Laterality: Right;   BREAST LUMPECTOMY WITH RADIOACTIVE SEED LOCALIZATION Right 09/20/2020   Procedure: RIGHT BREAST LUMPECTOMY WITH RADIOACTIVE SEED LOCALIZATION;  Surgeon: Sim Dryer, MD;  Location: [facility];  Service: General;   Laterality: Right;   COSMETIC SURGERY  2012   Lifestyle Lift   EYE SURGERY     right cataract removal 09/18/23   EYE SURGERY Left 01/14/2024   cataract removal   JOINT REPLACEMENT  08/2021   Left total knee replacement   KNEE SURGERY  2021   LOBECTOMY FOR SEIZURE FOCUS  1998   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   MENISECTOMY Left    SPLENECTOMY  1961   TONSILLECTOMY AND ADENOIDECTOMY  1963   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   Procedure: TOTAL KNEE ARTHROPLASTY;  Surgeon: Winston Hawking, MD;  Location: [facility];  Service: Orthopedics;  Laterality: Left;   TRIGGER FINGER RELEASE Right    middle   WISDOM TOOTH EXTRACTION      Allergies  Allergies  Allergen Reactions   Cetirizine  Other (See Comments)    Cognitive issues and "lowers seizure threshold."   Kenalog [Triamcinolone ]     Felt very strange   Metronidazole  Other (See Comments)    Strange feeling and inability to function   Quinolones    Scopolamine Hives     on her fingers no itchng   Penicillins Rash    History of Present Illness    Desiree Snow has a PMH of TIA, 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  CHA2DS2-VASc score 4.  Echocardiogram 05/14/2023 showed an LVEF of 60-65%,  Mildly dilated left atria and no evidence of  mitral valve regurgitation.  Echocardiogram 05/14/2023 showed LVEF of 60 to 65%, normal diastolic parameters, mildly dilated left atrium, normal right atrium and no significant valvular abnormalities.  Cardiac event monitor 8/24 showed 6% paroxysmal atrial fibrillation.  She was seen in follow-up by Dr. Lawana Pray on 07/30/2023.  She was initiated on flecainide  50 mg twice daily and diltiazem .  She reported compliance with apixaban .  She was seen in follow-up by A-fib clinic PA-C on 09/10/2023.  During that time she was anxious about potential side effects and wondered if there was a  contraindication between flecainide  and the other medications that she was taking.  She took 1 dose of flecainide  daily for several days to determine if the anxious feeling would improve or not.  She noted baseline anxiety.  During that time she denied palpitations, chest pain, shortness of breath, orthopnea, PND, lower extremity swelling, presyncope and syncope.  She was without complaint.  She was maintaining sinus rhythm.  Her intervals were stable.  Treadmill stress test was scheduled but not completed.    She presents to the clinic today for follow-up evaluation states she had fallen at home and was not able to do stress testing.  She has recovered well now and feels that she may be able to do treadmill stress test.  She does note that she walks some and goes to [employer] once per week.  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  She is a Publishing rights manager.  Initially her blood pressure today is 150/72 and on recheck it is 132/78.  She denies symptoms of atrial fibrillation.  She does note some occasional episodes of shortness of breath.  She attributes this to weather.  We reviewed her previous echocardiogram and she expressed understanding.  I will plan follow-up in around 12 months.  I will have her follow-up with the A-fib clinic in the next month or so.  Today she denies chest pain, shortness of breath, lower extremity edema, fatigue, palpitations, melena, hematuria, hemoptysis, diaphoresis, weakness, presyncope, syncope, orthopnea, and PND.     Home Medications    Prior to Admission medications   Medication Sig Start Date End Date Taking? Authorizing Provider  apixaban  (ELIQUIS ) 5 MG TABS tablet Take 1 tablet (5 mg total) by mouth 2 (two) times daily. 07/08/23   [facility], Warren Haber, PA  Calcium  Carbonate-Vit D-Min (CALCIUM  1200 PO) Take 1 tablet by mouth daily.    [provider]  Cholecalciferol  (VITAMIN D3) 1000 units CAPS Take 1,000 Units by mouth daily.     [provider]  CINNAMON  PO Take 100 mg by mouth at bedtime.    [provider]  Coenzyme Q10 (CO Q-10) 100 MG CAPS Take 100 mg by mouth at bedtime. 01/03/24   [provider]  Collagen-Vitamin C-Biotin (COLLAGEN PO) Biocell collagen-take 1 tablet by mouth daily Does not have Vitamin C in it    [provider]  diltiazem  (CARDIZEM  CD) 120 MG 24 hr capsule TAKE 1 CAPSULE BY MOUTH EVERY DAY 11/21/23   Camnitz, Babetta Lesch, MD  famotidine  (PEPCID ) 20 MG tablet Take 20 mg by mouth daily.    [provider]  Flaxseed, Linseed, (FLAX SEED OIL PO) Take 1 capsule by mouth daily.    [provider]  flecainide  (TAMBOCOR ) 100 MG tablet Take 1 tablet (100 mg total) by mouth 2 (two) times daily. 01/17/24   Camnitz, Babetta Lesch, MD  lamoTRIgine  (LAMICTAL ) 100 MG tablet Take 1 tablet (100 mg total)  by mouth 2 (two) times daily. 02/17/24   Johny Nap, NP  Magnesium  250 MG TABS Take 250 mg by mouth daily.    [provider]  meloxicam  (MOBIC ) 15 MG tablet Take 1 tablet (15 mg total) by mouth daily. 01/27/24   Salina Craver, MD  Multiple Vitamin (MULTIVITAMIN) tablet Take 1 tablet by mouth daily.    [provider]  pantoprazole  (PROTONIX ) 20 MG tablet Take 20 mg by mouth daily. Prescribed by GI    [provider]  TURMERIC PO Take 1 tablet by mouth daily.    [provider]    Family History    Family History  Problem Relation Age of Onset   Pneumonia Mother    Alcohol abuse Mother    Early death Mother    Dementia Father    Uterine cancer Maternal Aunt        dx before 30   Vision loss Maternal Aunt    Diabetes Maternal Uncle        x 2   Hypertension Maternal Grandmother    Stroke Maternal Grandmother    Clotting disorder Paternal Grandmother    Dementia Paternal Grandfather    Lung cancer Cousin        paternal female cousin; dx after 66   Cancer Maternal Aunt    Goiter Neg Hx    Thyroid  cancer  Neg Hx    Thyroid  nodules Neg Hx    Thyroid  disease Neg Hx    Colon cancer Neg Hx    Esophageal cancer Neg Hx    Rectal cancer Neg Hx    Stomach cancer Neg Hx    She indicated that her mother is deceased. She indicated that her father is deceased. She indicated that her sister is alive. She indicated that her brother is alive. She indicated that her maternal grandmother is deceased. She indicated that her maternal grandfather is deceased. She indicated that her paternal grandmother is deceased. She indicated that her paternal grandfather is deceased. She indicated that only one of her two maternal aunts is alive. She indicated that both of her maternal uncles are alive. She indicated that the status of her cousin is unknown. She indicated that the status of her neg hx is unknown.  Social History    Social History   Socioeconomic History   Marital status: Single    Spouse name: Not on file   Number of children: 0   Years of education: Not on file   Highest education level: Master's degree (e.g., MA, MS, MEng, MEd, MSW, MBA)  Occupational History   Occupation: NP     Comment: NA  Tobacco Use   Smoking status: Former    Current packs/day: 0.00    Average packs/day: 2.0 packs/day for 29.0 years (58.0 ttl pk-yrs)    Types: Cigarettes    Start date: 10/08/1973    Quit date: 12/10/2002    Years since quitting: 21.2    Passive exposure: Past   Smokeless tobacco: Never   Tobacco comments:    Decided I wanted to continue to breathe  Vaping Use   Vaping status: Never Used  Substance and Sexual Activity   Alcohol use: No   Drug use: No   Sexual activity: Not Currently    Birth control/protection: Post-menopausal  Other Topics Concern   Not on file  Social History Narrative   Lives alone   Caffeine- 2 c coffee   Social Drivers of Corporate investment banker  Strain: Low Risk  (12/24/2023)   Overall Financial Resource Strain (CARDIA)    Difficulty of Paying Living Expenses: Not hard  at all  Food Insecurity: No Food Insecurity (12/24/2023)   Hunger Vital Sign    Worried About Running Out of Food in the Last Year: Never true    Ran Out of Food in the Last Year: Never true  Transportation Needs: No Transportation Needs (12/24/2023)   PRAPARE - Administrator, Civil Service (Medical): No    Lack of Transportation (Non-Medical): No  Physical Activity: Insufficiently Active (12/24/2023)   Exercise Vital Sign    Days of Exercise per Week: 1 day    Minutes of Exercise per Session: 60 min  Stress: No Stress Concern Present (12/24/2023)   [employer] of Occupational Health - Occupational Stress Questionnaire    Feeling of Stress : Not at all  Social Connections: Socially Isolated (12/24/2023)   Social Connection and Isolation Panel [NHANES]    Frequency of Communication with Friends and Family: More than three times a week    Frequency of Social Gatherings with Friends and Family: Once a week    Attends Religious Services: Never    Database administrator or Organizations: No    Attends Banker Meetings: Never    Marital Status: Never married  Intimate Partner Violence: Not At Risk (12/24/2023)   Humiliation, Afraid, Rape, and Kick questionnaire    Fear of Current or Ex-Partner: No    Emotionally Abused: No    Physically Abused: No    Sexually Abused: No     Review of Systems    General:  No chills, fever, night sweats or weight changes.  Cardiovascular:  No chest pain, dyspnea on exertion, edema, orthopnea, palpitations, paroxysmal nocturnal dyspnea. Dermatological: No rash, lesions/masses Respiratory: No cough, dyspnea Urologic: No hematuria, dysuria Abdominal:   No nausea, vomiting, diarrhea, bright red blood per rectum, melena, or hematemesis Neurologic:  No visual changes, wkns, changes in mental status. All other systems reviewed and are otherwise negative except as noted above.  Physical Exam    VS:  BP 132/78   Pulse 79   Ht 5'  5.5" (1.664 m)   Wt 196 lb (88.9 kg)   SpO2 97%   BMI 32.12 kg/m  , BMI Body mass index is 32.12 kg/m. GEN: Well nourished, well developed, in no acute distress. HEENT: normal. Neck: Supple, no JVD, carotid bruits, or masses. Cardiac: RRR, no murmurs, rubs, or gallops. No clubbing, cyanosis, edema.  Radials/DP/PT 2+ and equal bilaterally.  Respiratory:  Respirations regular and unlabored, clear to auscultation bilaterally. GI: Soft, nontender, nondistended, BS + x 4. MS: no deformity or atrophy. Skin: warm and dry, no rash. Neuro:  Strength and sensation are intact. Psych: Normal affect.  Accessory Clinical Findings    Recent Labs: 07/08/2023: Magnesium  2.1 01/07/2024: ALT 14; BUN 17; Creatinine, Ser 0.99; Hemoglobin 13.1; Platelets 347.0; Potassium 4.1; Sodium 137; TSH 0.89   Recent Lipid Panel    Component Value Date/Time   CHOL 163 01/07/2024 1506   TRIG 189.0 (H) 01/07/2024 1506   HDL 51.30 01/07/2024 1506   CHOLHDL 3 01/07/2024 1506   VLDL 37.8 01/07/2024 1506   LDLCALC 73 01/07/2024 1506         ECG personally reviewed by me today- EKG Interpretation Date/Time:  Tuesday Mar 03 2024 15:42:48 EDT Ventricular Rate:  79 PR Interval:  182 QRS Duration:  90 QT Interval:  376 QTC Calculation: 431  R Axis:   18  Text Interpretation: Normal sinus rhythm Low voltage QRS When compared with ECG of 10-Sep-2023 14:24, No significant change was found Confirmed by Lawana Pray [phone]) on 03/03/2024 4:01:56 PM    Echocardiogram 05/14/2023  IMPRESSIONS     1. Left ventricular ejection fraction, by estimation, is 60 to 65%. Left  ventricular ejection fraction by 3D volume is 59 %. The left ventricle has  normal function. The left ventricle has no regional wall motion  abnormalities. Left ventricular diastolic   parameters were normal.   2. Right ventricular systolic function is normal. The right ventricular  size is normal. Tricuspid regurgitation signal is inadequate for  assessing  PA pressure.   3. Left atrial size was mildly dilated.   4. The mitral valve is normal in structure. No evidence of mitral valve  regurgitation. No evidence of mitral stenosis.   5. The aortic valve is tricuspid. Aortic valve regurgitation is not  visualized. No aortic stenosis is present.   6. The inferior vena cava is normal in size with greater than 50%  respiratory variability, suggesting right atrial pressure of 3 mmHg.   FINDINGS   Left Ventricle: Left ventricular ejection fraction, by estimation, is 60  to 65%. Left ventricular ejection fraction by 3D volume is 59 %. The left  ventricle has normal function. The left ventricle has no regional wall  motion abnormalities. The left  ventricular internal cavity size was normal in size. There is no left  ventricular hypertrophy. Left ventricular diastolic parameters were  normal.   Right Ventricle: The right ventricular size is normal. No increase in  right ventricular wall thickness. Right ventricular systolic function is  normal. Tricuspid regurgitation signal is inadequate for assessing PA  pressure.   Left Atrium: Left atrial size was mildly dilated.   Right Atrium: Right atrial size was normal in size.   Pericardium: There is no evidence of pericardial effusion.   Mitral Valve: The mitral valve is normal in structure. No evidence of  mitral valve regurgitation. No evidence of mitral valve stenosis.   Tricuspid Valve: The tricuspid valve is normal in structure. Tricuspid  valve regurgitation is not demonstrated.   Aortic Valve: The aortic valve is tricuspid. Aortic valve regurgitation is  not visualized. No aortic stenosis is present.   Pulmonic Valve: The pulmonic valve was not well visualized. Pulmonic valve  regurgitation is trivial. No evidence of pulmonic stenosis.   Aorta: The aortic root and ascending aorta are structurally normal, with  no evidence of dilitation.   Venous: The inferior vena cava is  normal in size with greater than 50%  respiratory variability, suggesting right atrial pressure of 3 mmHg.   IAS/Shunts: No atrial level shunt detected by color flow Doppler.      Assessment & Plan   1.  Paroxysmal atrial fibrillation-EKG today shows sinus rhythm 79 bpm.  Denies recent episodes of accelerated or irregular heart rate.  Reports compliance with Apixaban .  She denies bleeding issues and trauma.  CHA2DS2-VASc score 4 Avoid triggers caffeine, chocolate, EtOH, dehydration etc. Continue flecainide , diltiazem  Follow-up with A-fib clinic in the next month or so-recommend repeating/performing stress testing now that she is more mobile post fall.  History of TIA-her MRI was negative.  Neurologically intact. Continues to follow with neurology.  DOE-Breathing at baseline.  Somewhat limited in her physical activity due knee pain..  Able to perform activities of daily living.  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 Increase physical activity  as tolerated.  Echocardiogram 8/24 reassuring. Heart healthy low-sodium diet  Disposition: Follow-up with Dr. Renna Cary in 12 months.   Chet Cota. Terell Kincy NP-C     03/03/2024, 5:23 PM [facility] [address] Office [phone] Fax [phone]    I spent 14 minutes examining this patient, reviewing medications, and using patient centered shared decision making involving their cardiac care.   I spent  20 minutes reviewing past medical history,  medications, and prior cardiac tests.

## 2024-03-03 ENCOUNTER — Ambulatory Visit: Attending: General Practice | Admitting: General Practice

## 2024-03-03 ENCOUNTER — Encounter: Payer: Self-pay | Admitting: General Practice

## 2024-03-03 VITALS — BP 132/78 | HR 79 | Ht 65.5 in | Wt 196.0 lb

## 2024-03-03 DIAGNOSIS — G459 Transient cerebral ischemic attack, unspecified: Secondary | ICD-10-CM

## 2024-03-03 DIAGNOSIS — R0609 Other forms of dyspnea: Secondary | ICD-10-CM | POA: Diagnosis not present

## 2024-03-03 DIAGNOSIS — I480 Paroxysmal atrial fibrillation: Secondary | ICD-10-CM | POA: Diagnosis not present

## 2024-03-03 DIAGNOSIS — Z09 Encounter for follow-up examination after completed treatment for conditions other than malignant neoplasm: Secondary | ICD-10-CM | POA: Diagnosis not present

## 2024-03-03 NOTE — Patient Instructions (Signed)
 Medication Instructions:  The current medical regimen is effective;  continue present plan and medications as directed. Please refer to the Current Medication list given to you today.  *If you need a refill on your cardiac medications before your next appointment, please call your pharmacy*  Lab Work: NONE If you have labs (blood work) drawn today and your tests are completely normal, you will receive your results only by: MyChart Message (if you have MyChart) OR A paper copy in the mail If you have any lab test that is abnormal or we need to change your treatment, we will call you to review the results.  Other Instructions INCREASE PHYSICAL ACTIVITY-AS TOLERATED  Follow-Up: At [facility], you and your health needs are our priority.  As part of our continuing mission to provide you with exceptional heart care, our providers are all part of one team.  This team includes your primary Cardiologist (physician) and Advanced Practice Providers or APPs (Physician Assistants and Nurse Practitioners) who all work together to provide you with the care you need, when you need it.  Your next appointment:   12 month(s)  Provider:   Dorothye Gathers, MD

## 2024-03-09 ENCOUNTER — Other Ambulatory Visit: Payer: Self-pay

## 2024-03-09 ENCOUNTER — Ambulatory Visit (HOSPITAL_BASED_OUTPATIENT_CLINIC_OR_DEPARTMENT_OTHER): Attending: Family Medicine | Admitting: Physical Therapy

## 2024-03-09 ENCOUNTER — Encounter (HOSPITAL_BASED_OUTPATIENT_CLINIC_OR_DEPARTMENT_OTHER): Payer: Self-pay | Admitting: Physical Therapy

## 2024-03-09 DIAGNOSIS — M79651 Pain in right thigh: Secondary | ICD-10-CM | POA: Diagnosis not present

## 2024-03-09 DIAGNOSIS — M6281 Muscle weakness (generalized): Secondary | ICD-10-CM | POA: Insufficient documentation

## 2024-03-09 DIAGNOSIS — M25551 Pain in right hip: Secondary | ICD-10-CM | POA: Diagnosis not present

## 2024-03-09 NOTE — Therapy (Signed)
 OUTPATIENT PHYSICAL THERAPY LOWER EXTREMITY EVALUATION   Patient Name: Desiree Snow MRN: [id number] DOB:[birth year], 73 y.o., female Today's Date: 03/09/2024  END OF SESSION:  PT End of Session - 03/09/24 1609     Visit Number 1    Number of Visits 12    Date for PT Re-Evaluation 06/07/24    Authorization Type [employer]    PT Start Time 1449    PT Stop Time 1545    PT Time Calculation (min) 56 min    Activity Tolerance Patient tolerated treatment well    Behavior During Therapy WFL for tasks assessed/performed             Past Medical History:  Diagnosis Date   Allergy     Anxiety    Arthritis    Blood transfusion without reported diagnosis    Cataract    Chicken pox    Colon polyps    Depression 1998   Postop effect after Amygdalohippocampetomy   Diverticulosis    Family history of uterine cancer 05/05/2021   GERD (gastroesophageal reflux disease)    H/O febrile seizure    as infant, x 1, List of AEDs tried: Dilantin, Mysoline, Tegretol, Gabapentine, Depakote,  Keppra, and Lamictal ,   Seizures (HCC)    "resolved" last seizure in 2002   Thyroid  goiter    Ulcer    small gastric ulcers found during endoscopy   Past Surgical History:  Procedure Laterality Date   BRAIN SURGERY  1998   Right Amygdalohippocampectomy   BREAST BIOPSY Right 07/29/2020   COMPLEX SCLEROSING LESION WITH ATYPICAL DUCTAL   BREAST BIOPSY Right 01/01/2020   COMPLEX SCLEROSING LESION    BREAST EXCISIONAL BIOPSY Right 09/20/2020   complex sclerosing   BREAST EXCISIONAL BIOPSY Right 03/01/2020   Favor IDC, CSL also considered   BREAST EXCISIONAL BIOPSY Left    ? Date  ?lipoma   BREAST LUMPECTOMY WITH RADIOACTIVE SEED LOCALIZATION Right 03/01/2020   Procedure: RIGHT BREAST LUMPECTOMY WITH RADIOACTIVE SEED LOCALIZATION;  Surgeon: Sim Dryer, MD;  Location: [facility];  Service: General;  Laterality: Right;   BREAST LUMPECTOMY WITH RADIOACTIVE SEED LOCALIZATION Right  09/20/2020   Procedure: RIGHT BREAST LUMPECTOMY WITH RADIOACTIVE SEED LOCALIZATION;  Surgeon: Sim Dryer, MD;  Location: [facility];  Service: General;  Laterality: Right;   COSMETIC SURGERY  2012   Lifestyle Lift   EYE SURGERY     right cataract removal 09/18/23   EYE SURGERY Left 01/14/2024   cataract removal   JOINT REPLACEMENT  08/2021   Left total knee replacement   KNEE SURGERY  2021   LOBECTOMY FOR SEIZURE FOCUS  1998   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   MENISECTOMY Left    SPLENECTOMY  1961   TONSILLECTOMY AND ADENOIDECTOMY  1963   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   Procedure: TOTAL KNEE ARTHROPLASTY;  Surgeon: Winston Hawking, MD;  Location: [facility];  Service: Orthopedics;  Laterality: Left;   TRIGGER FINGER RELEASE Right    middle   WISDOM TOOTH EXTRACTION     Patient Active Problem List   Diagnosis Date Noted   Right thigh pain 01/07/2024   Routine general medical examination at a health care facility 01/07/2024   Right elbow pain 10/25/2023   Encounter for monitoring flecainide  therapy 09/10/2023   Paroxysmal atrial fibrillation (HCC) 09/10/2023   H/O total knee replacement, left 08/25/2021   Genetic testing 05/26/2021   Atypical ductal hyperplasia of right breast 12/14/2020   Chronic  rhinitis 02/04/2019   DOE (dyspnea on exertion) 02/04/2019   Multinodular goiter 06/20/2017   Incidental pulmonary nodule 05/15/2017   Seizure disorder (HCC) 05/15/2017   GERD (gastroesophageal reflux disease) 04/12/2017   IFG (impaired fasting glucose) 04/12/2017    PCP: Salina Craver, MD   REFERRING PROVIDER: Adelia Homestead, MD   REFERRING DIAG:  Diagnosis  M25.551 (ICD-10-CM) - Pain of right hip    THERAPY DIAG:  Pain in right thigh - Plan: PT plan of care cert/re-cert  Muscle weakness (generalized) - Plan: PT plan of care cert/re-cert  Rationale for Evaluation and Treatment: Rehabilitation  ONSET  DATE: ~2019  SUBJECTIVE:   SUBJECTIVE STATEMENT:  Started 2019 at [facility] with excessive stretching movement in figure 4. Pt reports it has progressively worsened. Pt does not radiate to other areas and is focused to groin region. Pt is adamant in that this is not a R hip issue or a problem with muscular origin and insertions. Pt reports that it is very painful with R LE adduction and palpation to the medial thigh/groin. Pt has history of L LE lift in shoe. Denies NT. Pt notes footwear is hard to find due to narrow foot and causing R foot to move around. Denies cancer red flags. Pt will sometimes will have pain at night and will need tylenol  to sleep. Pt [employer] 1x/week for silver sneakers. 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     PERTINENT HISTORY: Anxiety, depression, seizures, DOE, L TKA, R ankle sprain  PAIN:  Are you having pain? Yes: NPRS scale: 2/10 at rest; 10/10 at worst  Pain location: R adductor Pain description: sharp Aggravating factors: squatting, cars, transfers, hip rotations Relieving factors: nothing  PRECAUTIONS: Other: seizures  RED FLAGS: None   WEIGHT BEARING RESTRICTIONS: No  FALLS:  Has patient fallen in last 6 months? Yes. Number of falls 1 January, tripped over a mat. 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  DIAGNOSTIC FINDINGS:   IMPRESSION: 1. 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 2. Diffuse degenerative irregularity and tears of the anterior superior and superior right acetabular labrum. 3. Very mild right greater than left obturator externus origin muscle strains. No fluid bright tear. 4. Mild left common  hamstring origin tendinosis. 5. Minimal left gluteus minimus insertional tendinosis. 6. Moderate pubic symphysis osteoarthritis. 7. Imaged only on a single large field-of-view coronal STIR sequence, there are two lesions within the left femoral diaphysis that are nonspecific but grossly compatible with remote infarcts. No acute fracture or cortical break is seen.     Electronically Signed   By: Bertina Broccoli M.D.   On: 01/27/2024 08:57  PATIENT SURVEYS:   Lower Extremity Functional Score: 33 / 80 = 41.3 % COGNITION: Overall cognitive status: Within functional limits for tasks assessed                         PALPATION: TTP of R proximal adductor group; mild hypertonicity;   LOWER EXTREMITY MMT:   MMT (HHD in lbs)  L eval R eval  Hip flexion 37.3 30.4 p!  Hip extension    Hip abduction 32.9 27.9  Hip adduction 21.3 15.3 p!  Hip internal rotation  Hip external rotation    Knee flexion    Knee extension 28.6 24.6  Ankle dorsiflexion      Ankle plantarflexion      Ankle inversion      Ankle eversion       (Blank rows = not tested)   LOWER EXTREMITY ROM: moderately limited in all planes but especially painful with active ADD and passive ABD  Specials Tests: positive FABER bilaterally Positive leg roll on R    GAIT: 5ft Assistive device utilized:N/A Level of assistance: Independence Comments: Mild toe out, decreased step length bilaterally     Transfers: painful adduction with supine to sit, unable to actively adductor without pain once in butterfly position                                                                                                                                TREATMENT DATE:    6/2  Exercises - Supine Hip Adduction Isometric with Ball  - 2 x daily - 7 x weekly - 2 sets - 10 reps - Supine Bridge with Mini Swiss Ball Between Knees  - 2 x daily - 7 x weekly - 2 sets - 10 reps - Supine Butterfly Groin Stretch  - 2 x daily - 7 x weekly -  1 sets - 3 reps - 30 hold   Thermotherapy, self pain management, activity tolerance and progressive exercise   PATIENT EDUCATION:  Education details: MOI, diagnosis, prognosis, acceptable levels of pain, anatomy, exercise progression, DOMS expectations, muscle firing,  envelope of function, HEP, POC  Person educated: Patient Education method: Explanation, Demonstration, Tactile cues, and Verbal cues Education comprehension: verbalized understanding, returned demonstration, verbal cues required, tactile cues required, and needs further education     HOME EXERCISE PROGRAM:   Access Code: [email or web address] URL: [email or web address] Date: 03/09/2024 Prepared by: Silver Dross      ASSESSMENT:   CLINICAL IMPRESSION:  Patient is a 73 y.o. female who was seen today for physical therapy evaluation and treatment for R thigh/groin pain. Pt's s/s appear consistent with R adductor strain and chronic sensitivity. Pt with expected soft tissue length, ROM, and strength deficits.  Pt is pain limited and very stiff into R hip at this time. Plan to proceed with ROM focus and progressive R adductor strength as tolerated. Pt history of R hip OA and joint stiffness is likely contributing factor as well as pt's history of L TKA and reported leg length discrepancy. Pt would benefit from continued skilled therapy in order to reach goals and maximize functional R LE strength and ROM for full return to normalized community mobility, exercise, and ADL.     OBJECTIVE IMPAIRMENTS: decreased activity tolerance, decreased endurance, decreased mobility, difficulty walking, decreased strength, and pain.    ACTIVITY LIMITATIONS: standing, transfers, and locomotion level   PARTICIPATION LIMITATIONS: cleaning, shopping, and community activity   PERSONAL FACTORS: Age, Fitness, 2+ comorbidities, and Time since onset of injury/illness/exacerbation  are also affecting patient's functional outcome.    REHAB POTENTIAL:  Good   CLINICAL DECISION MAKING: stable/uncomplicated   EVALUATION COMPLEXITY: Low     GOALS:     SHORT TERM GOALS: Target date: 04/20/2024      Pt will be independent and compliant with HEP for improved pain, strength, and function.  Baseline: Goal status: INITIAL     2.  Pt will be able to demonstrate ability to perform supine <> sit transfers without pain in order to demonstrate functional improvement in LE function for self-care and house hold duties.    Baseline:  Goal status: INITIAL   3. Pt will report at least 2 pt reduction on NPRS scale for pain in order to demonstrate functional improvement with household activity, self care, and ADL.      Baseline:  Goal status: INITIAL     LONG TERM GOALS: Target date: 06/01/2024      Pt will be independent and compliant with HEP for improved pain, strength, and function.  Baseline: Goal status: INITIAL   2.  Pt will be able to demonstrate at least 10 lb strength improvement in R adduction in order to demonstrate functional improvement in LE function for self-care and community mobility    Baseline:  Goal status: INITIAL   3.  Pt will be able to demonstrate DL squat/ STS to beyond parallel depth without pain in order to demonstrate functional improvement in R LE strength for return to PLOF and exercise.    Baseline:  Goal status: INITIAL   4. Pt will report ability to getting in and out of car and bed without pain  in order to demonstrate functional improvement with household activity, self care, and ADL.     Baseline:  Goal status: INITIAL   PLAN:   PT FREQUENCY: 1-2x/week   PT DURATION: 12 wks   PLANNED INTERVENTIONS: 97164- PT Re-evaluation, 97110-Therapeutic exercises, 97530- Therapeutic activity, [id number]- Neuromuscular re-education, 97535- Self Care, [id number]- Manual therapy, [id number]- Gait training, [phone]- Aquatic Therapy, 97014- Electrical stimulation (unattended), [id number]- Electrical stimulation (manual), [id number]-  Ultrasound, Patient/Family education, Balance training, Stair training, Taping, Dry Needling, Joint mobilization, Spinal mobilization, Cryotherapy, and Moist heat   PLAN FOR NEXT SESSION: R  adductor ROM/mobility and strength         Silver Dross, PT 03/09/2024, 4:15 PM   Referring diagnosis?  M25.551 (ICD-10-CM) - Pain of right hip   Treatment diagnosis? (if different than referring diagnosis) M79.651; m62.81 What was this (referring dx) caused by? []  Surgery []  Fall [x]  Ongoing issue []  Arthritis [x]  Other: ______strain______  Laterality: [x]  Rt []  Lt []  Both  Date of referral: 01/27/24 Referring provider: Salina Craver, MD    Lonne Roan of Condition: Chronic (continuous duration > 3 months)   Laterality: Rt  Current Functional Measure Score: LEFS 33 / 80 = 41.3 %  Objective measurements identify impairments when they are compared to normal values, the uninvolved extremity, and prior level of function.  []  Yes  []  No  Objective assessment of functional ability: Moderate functional limitations   Briefly describe symptoms: R thigh/groin pain  How did symptoms start: muscle strain  Average pain intensity:  Last 24 hours: 5/10  Past week: 5/10  How often does the pt experience symptoms? Constantly  How much have the symptoms interfered with usual daily activities? Quite a bit  How has condition changed since care began at this facility? NA - initial visit  In general, how is the patients overall health? Good

## 2024-03-15 NOTE — Progress Notes (Unsigned)
 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 Pt c/o DOE x 4 wks- gets winded walking room to room sometimes- clariton has helped some.   Dyspnea:  Room to room but better since on clariton Cough: none Sleep: on side / bed is flat / one pillow  SABA use: no inhalers  rec No change rx     03/25/2019  f/u ov/Kasin Tonkinson re: doe  Chief Complaint  Patient presents with   Follow-up    PFT's done today. Breathing has improved since her last visit.   Dyspnea:  Not limited by breathing from desired activities  / steps  ok now / more limited by R hip  Cough: assoc pnds/ throat clearing day > noct  No better with clariton  Sleeping: one pillow  SABA use: none 02: none  Rec Option for nasal symptoms is singulair 10 mg one daily as maintenance Best otc for drippy nose is zyrtec  10 mg daily as needed   If not satisfied I'll be happy to refer you to a qualified allergist    03/17/2024  re-estabalish ov/Nikko Quast re: UACS / ? Need for CT chest  Chief Complaint  Patient presents with   Follow-up    D.O.E Consult   Dyspnea:  does silver sneakers ok and no problem with steps at work, thinks prior doe was actually Afib and not having doe or afib anymore  Cough: entire adult life has had urge to clear while awake. Never sleeping, never productive, attibutes to pnds  Sleeping: bed is flat /one pillow no noct or am premature cough but after at least an hour or two starts noting the pnds and  starts clearing her throat, worse in last 3 months but still nothing noct  SABA use: no 02: none    No obvious day to day or daytime variability or assoc excess/ purulent sputum or mucus plugs or hemoptysis or cp or chest tightness, subjective wheeze or overt   hb symptoms.    Also denies any obvious fluctuation of symptoms with weather or environmental changes or other aggravating or alleviating factors except as outlined above   No unusual exposure hx or h/o childhood pna/ asthma or knowledge of premature birth.  Current Allergies, Complete Past Medical History, Past Surgical History, Family History, and Social History were reviewed in [employer] record.  ROS  The following are not active complaints unless bolded Hoarseness, sore throat(globus) , dysphagia, dental problems, itching, sneezing,  nasal congestion or sensation of discharge of excess mucus or purulent secretions, ear ache,   fever, chills, sweats, unintended wt loss or wt gain, classically pleuritic or exertional cp,  orthopnea pnd or arm/hand swelling  or leg swelling, presyncope, palpitations, abdominal pain, anorexia, nausea, vomiting, diarrhea  or change in bowel habits or change in bladder habits, change in stools or change in urine, dysuria, hematuria,  rash, arthralgias, visual complaints, headache, numbness, weakness or ataxia or problems  with walking or coordination,  change in mood or  memory.        Current Meds  Medication Sig   apixaban  (ELIQUIS ) 5 MG TABS tablet Take 1 tablet (5 mg total) by mouth 2 (two) times daily.   B Complex Vitamins (B COMPLEX 1 PO)    Calcium  Carbonate-Vit D-Min (CALCIUM  1200 PO) Take 1 tablet by mouth daily.   Cholecalciferol  (VITAMIN D3) 1000 units CAPS Take 1,000 Units by mouth daily.   CINNAMON  PO Take 100 mg by mouth at bedtime.   Coenzyme Q10 (CO Q-10) 100 MG CAPS Take 100 mg by mouth at bedtime.   Collagen-Vitamin C-Biotin (COLLAGEN PO) Biocell collagen-take 1  tablet by mouth daily Does not have Vitamin C in it   diltiazem  (CARDIZEM  CD) 120 MG 24 hr capsule TAKE 1 CAPSULE BY MOUTH EVERY DAY   famotidine  (PEPCID ) 20 MG tablet Take 20 mg by mouth daily.   Flaxseed, Linseed, (FLAX SEED OIL PO) Take 1 capsule by mouth daily.   flecainide  (TAMBOCOR ) 100 MG tablet Take 1 tablet (100 mg total) by mouth 2 (two) times daily.   lamoTRIgine  (LAMICTAL ) 100 MG tablet Take 1 tablet (100 mg total) by mouth 2 (two) times daily.   Magnesium  250 MG TABS Take 250 mg by mouth daily.   meloxicam  (MOBIC ) 15 MG tablet Take 1 tablet (15 mg total) by mouth daily.   Multiple Vitamin (MULTIVITAMIN) tablet Take 1 tablet by mouth daily.   TURMERIC PO Take 1 tablet by mouth daily.                 Past Medical History:  Diagnosis Date   Allergy     Chicken pox    Diverticulitis    GERD (gastroesophageal reflux disease)    Seizures (HCC)    resolved       Objective:    wts   03/17/2024       198    03/25/19 221 lb (100.2 kg)  02/04/19 221 lb 6.4 oz (100.4 kg)  12/25/17 216 lb 8 oz (98.2 kg)     Vital signs reviewed  03/17/2024  - Note at rest 02 sats  97% on RA   General appearance:    pleasantly verbose amb wf freq throat clearing    HEENT : Oropharynx  clear/ no viz pnd/ no cobblestoning      Nasal turbinates nl  Wax impaction R ear no cough on otiscope insertion    NECK :  without  apparent JVD/ palpable Nodes/TM    LUNGS: no acc muscle use,  Nl contour chest which is clear to A and P bilaterally without cough on insp or exp maneuvers   CV:  RRR  no s3 or murmur or increase in P2, and no edema   ABD:  soft and nontender   MS:  Gait nl   ext warm without deformities Or obvious joint restrictions  calf tenderness, cyanosis or clubbing    SKIN: warm and dry without lesions    NEURO:  alert, approp, nl sensorium with  no motor or cerebellar deficits apparent.     CXR PA and Lateral:   03/17/2024 :    I personally reviewed images and impression  is as follows:     No acute findings       Assessment

## 2024-03-17 ENCOUNTER — Ambulatory Visit: Admitting: Internal Medicine

## 2024-03-17 ENCOUNTER — Ambulatory Visit (INDEPENDENT_AMBULATORY_CARE_PROVIDER_SITE_OTHER)

## 2024-03-17 ENCOUNTER — Encounter: Payer: Self-pay | Admitting: Internal Medicine

## 2024-03-17 ENCOUNTER — Encounter (HOSPITAL_BASED_OUTPATIENT_CLINIC_OR_DEPARTMENT_OTHER): Payer: Self-pay | Admitting: Physical Therapy

## 2024-03-17 ENCOUNTER — Ambulatory Visit (HOSPITAL_BASED_OUTPATIENT_CLINIC_OR_DEPARTMENT_OTHER): Admitting: Physical Therapy

## 2024-03-17 VITALS — BP 136/72 | HR 77 | Temp 97.8°F | Ht 66.0 in | Wt 198.0 lb

## 2024-03-17 DIAGNOSIS — M25551 Pain in right hip: Secondary | ICD-10-CM | POA: Diagnosis not present

## 2024-03-17 DIAGNOSIS — M6281 Muscle weakness (generalized): Secondary | ICD-10-CM | POA: Diagnosis not present

## 2024-03-17 DIAGNOSIS — R059 Cough, unspecified: Secondary | ICD-10-CM | POA: Diagnosis not present

## 2024-03-17 DIAGNOSIS — R911 Solitary pulmonary nodule: Secondary | ICD-10-CM

## 2024-03-17 DIAGNOSIS — J310 Chronic rhinitis: Secondary | ICD-10-CM | POA: Diagnosis not present

## 2024-03-17 DIAGNOSIS — Z87891 Personal history of nicotine dependence: Secondary | ICD-10-CM

## 2024-03-17 DIAGNOSIS — M79651 Pain in right thigh: Secondary | ICD-10-CM | POA: Diagnosis not present

## 2024-03-17 MED ORDER — PANTOPRAZOLE SODIUM 40 MG PO TBEC: 40.0000 mg | DELAYED_RELEASE_TABLET | Freq: Every day | ORAL | 2 refills | Status: DC

## 2024-03-17 MED ORDER — FAMOTIDINE 20 MG PO TABS: ORAL_TABLET | ORAL | 11 refills | Status: AC

## 2024-03-17 NOTE — Patient Instructions (Addendum)
 GERD (REFLUX)  is an extremely common cause of respiratory symptoms just like yours , many times with no obvious heartburn at all.    It can be treated with medication, but also with lifestyle changes including elevation of the head of your bed (ideally with 6 -8inch blocks under the headboard of your bed),  Smoking cessation, avoidance of late meals, excessive alcohol, and avoid fatty foods, chocolate, peppermint, colas, red wine, and acidic juices such as orange juice.  NO MINT OR MENTHOL  PRODUCTS SO NO COUGH DROPS - LUDENs USE SUGARLESS CANDY INSTEAD (Jolley ranchers or [employer]'s or Environmental manager) or even ice chips will also do - the key is to swallow to prevent all throat clearing. NO OIL BASED VITAMINS - use powdered substitutes.  Avoid fish oil when coughing.    Pantoprazole  (protonix ) 40 mg  Take  30-60 min before first meal of the day and Pepcid  (famotidine )  20 mg after supper until return to office - this is the best way to tell whether stomach acid is contributing to your problem.     Call me in 6 weeks if you are not feeling back to normal self  and I will arrange for a chest/sinus   CT

## 2024-03-17 NOTE — Therapy (Signed)
 OUTPATIENT PHYSICAL THERAPY LOWER EXTREMITY TREATMENT   Patient Name: Desiree Snow MRN: [id number] DOB:[birth year], 73 y.o., female Today's Date: 03/17/2024  END OF SESSION:  PT End of Session - 03/17/24 1610     Visit Number 2    Number of Visits 12    Date for PT Re-Evaluation 06/01/24    Authorization Type [employer]    Authorization Time Period 6/2-8/25    Authorization - Visit Number 12    PT Start Time 1614    PT Stop Time 1654    PT Time Calculation (min) 40 min    Activity Tolerance Patient tolerated treatment well    Behavior During Therapy WFL for tasks assessed/performed             Past Medical History:  Diagnosis Date   Allergy     Anxiety    Arthritis    Blood transfusion without reported diagnosis    Cataract    Chicken pox    Colon polyps    Depression 1998   Postop effect after Amygdalohippocampetomy   Diverticulosis    Family history of uterine cancer 05/05/2021   GERD (gastroesophageal reflux disease)    H/O febrile seizure    as infant, x 1, List of AEDs tried: Dilantin, Mysoline, Tegretol, Gabapentine, Depakote,  Keppra, and Lamictal ,   Seizures (HCC)    "resolved" last seizure in 2002   Thyroid  goiter    Ulcer    small gastric ulcers found during endoscopy   Past Surgical History:  Procedure Laterality Date   BRAIN SURGERY  1998   Right Amygdalohippocampectomy   BREAST BIOPSY Right 07/29/2020   COMPLEX SCLEROSING LESION WITH ATYPICAL DUCTAL   BREAST BIOPSY Right 01/01/2020   COMPLEX SCLEROSING LESION    BREAST EXCISIONAL BIOPSY Right 09/20/2020   complex sclerosing   BREAST EXCISIONAL BIOPSY Right 03/01/2020   Favor IDC, CSL also considered   BREAST EXCISIONAL BIOPSY Left    ? Date  ?lipoma   BREAST LUMPECTOMY WITH RADIOACTIVE SEED LOCALIZATION Right 03/01/2020   Procedure: RIGHT BREAST LUMPECTOMY WITH RADIOACTIVE SEED LOCALIZATION;  Surgeon: Sim Dryer, MD;  Location: [facility];  Service: General;  Laterality:  Right;   BREAST LUMPECTOMY WITH RADIOACTIVE SEED LOCALIZATION Right 09/20/2020   Procedure: RIGHT BREAST LUMPECTOMY WITH RADIOACTIVE SEED LOCALIZATION;  Surgeon: Sim Dryer, MD;  Location: [facility];  Service: General;  Laterality: Right;   COSMETIC SURGERY  2012   Lifestyle Lift   EYE SURGERY     right cataract removal 09/18/23   EYE SURGERY Left 01/14/2024   cataract removal   JOINT REPLACEMENT  08/2021   Left total knee replacement   KNEE SURGERY  2021   LOBECTOMY FOR SEIZURE FOCUS  1998   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   MENISECTOMY Left    SPLENECTOMY  1961   TONSILLECTOMY AND ADENOIDECTOMY  1963   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   Procedure: TOTAL KNEE ARTHROPLASTY;  Surgeon: Winston Hawking, MD;  Location: [facility];  Service: Orthopedics;  Laterality: Left;   TRIGGER FINGER RELEASE Right    middle   WISDOM TOOTH EXTRACTION     Patient Active Problem List   Diagnosis Date Noted   Right thigh pain 01/07/2024   Routine general medical examination at a health care facility 01/07/2024   Right elbow pain 10/25/2023   Encounter for monitoring flecainide  therapy 09/10/2023   Paroxysmal atrial fibrillation (HCC) 09/10/2023   H/O total knee replacement, left 08/25/2021  Genetic testing 05/26/2021   Atypical ductal hyperplasia of right breast 12/14/2020   Chronic rhinitis 02/04/2019   DOE (dyspnea on exertion) 02/04/2019   Multinodular goiter 06/20/2017   Incidental pulmonary nodule 05/15/2017   Seizure disorder (HCC) 05/15/2017   GERD (gastroesophageal reflux disease) 04/12/2017   IFG (impaired fasting glucose) 04/12/2017    PCP: Salina Craver, MD   REFERRING PROVIDER: Adelia Homestead, MD   REFERRING DIAG:  Diagnosis  M25.551 (ICD-10-CM) - Pain of right hip    THERAPY DIAG:  Pain in right thigh  Muscle weakness (generalized)  Rationale for Evaluation and Treatment: Rehabilitation  ONSET DATE:  ~2019  SUBJECTIVE:   SUBJECTIVE STATEMENT:  Pt reports groin stretch has helped the pain. The first time she did it was very painful but improved as she did it. Getting in and out of the car is still bothersome.   Eval:   Started 2019 at [facility] with excessive stretching movement in figure 4. Pt reports it has progressively worsened. Pt does not radiate to other areas and is focused to groin region. Pt is adamant in that this is not a R hip issue or a problem with muscular origin and insertions. Pt reports that it is very painful with R LE adduction and palpation to the medial thigh/groin. Pt has history of L LE lift in shoe. Denies NT. Pt notes footwear is hard to find due to narrow foot and causing R foot to move around. Denies cancer red flags. Pt will sometimes will have pain at night and will need tylenol  to sleep. Pt [employer] 1x/week for silver sneakers. 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     PERTINENT HISTORY: Anxiety, depression, seizures, DOE, L TKA, R ankle sprain  PAIN:  Are you having pain? Yes: NPRS scale: 2/10 at rest; 6-7/10 at worst  Pain location: R adductor Pain description: sharp Aggravating factors: squatting, cars, transfers, hip rotations Relieving factors: nothing  PRECAUTIONS: Other: seizures  RED FLAGS: None   WEIGHT BEARING RESTRICTIONS: No  FALLS:  Has patient fallen in last 6 months? Yes. Number of falls 1 January, tripped over a mat. 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  DIAGNOSTIC FINDINGS:   IMPRESSION: 1. 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 2. Diffuse degenerative irregularity and tears of the  anterior superior and superior right acetabular labrum. 3. Very mild right greater than left obturator externus origin muscle strains. No fluid bright tear. 4. Mild left common hamstring origin tendinosis. 5. Minimal left gluteus minimus insertional tendinosis. 6. Moderate pubic symphysis osteoarthritis. 7. Imaged only on a single large field-of-view coronal STIR sequence, there are two lesions within the left femoral diaphysis that are nonspecific but grossly compatible with remote infarcts. No acute fracture or cortical break is seen.     Electronically Signed   By: Bertina Broccoli M.D.   On: 01/27/2024 08:57  PATIENT SURVEYS:   Lower Extremity Functional Score: 33 / 80 = 41.3 % COGNITION: Overall cognitive status: Within functional limits for tasks assessed                         PALPATION: TTP of R proximal adductor group; mild hypertonicity;   LOWER EXTREMITY MMT:  MMT (HHD in lbs)  L eval R eval  Hip flexion 37.3 30.4 p!  Hip extension    Hip abduction 32.9 27.9  Hip adduction 21.3 15.3 p!  Hip internal rotation    Hip external rotation    Knee flexion    Knee extension 28.6 24.6  Ankle dorsiflexion      Ankle plantarflexion      Ankle inversion      Ankle eversion       (Blank rows = not tested)   LOWER EXTREMITY ROM: moderately limited in all planes but especially painful with active ADD and passive ABD  Specials Tests: positive FABER bilaterally Positive leg roll on R    GAIT: 51ft Assistive device utilized:N/A Level of assistance: Independence Comments: Mild toe out, decreased step length bilaterally     Transfers: painful adduction with supine to sit, unable to actively adductor without pain once in butterfly position                                                                                                                                TREATMENT DATE:    6/10  STM R adductor magnus distal portion  Groin stretch 30s 3x  supine Standing adductor stretch 30s 3x Deep squat at rail 3x6 Step flexion groin stretch 10s 10x Leg swing 2x20 Wall RDL with foam roller on thighs 2x10  6/2  Exercises - Supine Hip Adduction Isometric with Ball  - 2 x daily - 7 x weekly - 2 sets - 10 reps - Supine Bridge with Mini Swiss Ball Between Knees  - 2 x daily - 7 x weekly - 2 sets - 10 reps - Supine Butterfly Groin Stretch  - 2 x daily - 7 x weekly - 1 sets - 3 reps - 30 hold   Thermotherapy, self pain management, activity tolerance and progressive exercise   PATIENT EDUCATION:  Education details:  acceptable levels of pain, anatomy, exercise progression, DOMS expectations, muscle firing,  envelope of function, HEP, POC  Person educated: Patient Education method: Explanation, Demonstration, Tactile cues, and Verbal cues Education comprehension: verbalized understanding, returned demonstration, verbal cues required, tactile cues required, and needs further education     HOME EXERCISE PROGRAM:   Access Code: [email or web address] URL: [email or web address] Date: 03/09/2024 Prepared by: Silver Dross      ASSESSMENT:   CLINICAL IMPRESSION:  Pt able to progress with adductor strengthening and stretching today as well as deep hip flexor to target adductor region. Active adduction is still painful but improves during session. Hypertonicity does improve with STM and advised on massage therapy PRN. Plan to continue with progression towards active adduction and deep squatting as tolerated. Pt would benefit from continued skilled therapy in order to reach goals and maximize functional R LE strength and ROM for full return to normalized community mobility, exercise, and ADL.     OBJECTIVE IMPAIRMENTS: decreased activity tolerance, decreased endurance, decreased mobility, difficulty walking,  decreased strength, and pain.    ACTIVITY LIMITATIONS: standing, transfers, and locomotion level   PARTICIPATION LIMITATIONS: cleaning,  shopping, and community activity   PERSONAL FACTORS: Age, Fitness, 2+ comorbidities, and Time since onset of injury/illness/exacerbation are also affecting patient's functional outcome.    REHAB POTENTIAL: Good   CLINICAL DECISION MAKING: stable/uncomplicated   EVALUATION COMPLEXITY: Low     GOALS:     SHORT TERM GOALS: Target date: 04/20/2024      Pt will be independent and compliant with HEP for improved pain, strength, and function.  Baseline: Goal status: INITIAL     2.  Pt will be able to demonstrate ability to perform supine <> sit transfers without pain in order to demonstrate functional improvement in LE function for self-care and house hold duties.    Baseline:  Goal status: INITIAL   3. Pt will report at least 2 pt reduction on NPRS scale for pain in order to demonstrate functional improvement with household activity, self care, and ADL.      Baseline:  Goal status: INITIAL     LONG TERM GOALS: Target date: 06/01/2024      Pt will be independent and compliant with HEP for improved pain, strength, and function.  Baseline: Goal status: INITIAL   2.  Pt will be able to demonstrate at least 10 lb strength improvement in R adduction in order to demonstrate functional improvement in LE function for self-care and community mobility    Baseline:  Goal status: INITIAL   3.  Pt will be able to demonstrate DL squat/ STS to beyond parallel depth without pain in order to demonstrate functional improvement in R LE strength for return to PLOF and exercise.    Baseline:  Goal status: INITIAL   4. Pt will report ability to getting in and out of car and bed without pain  in order to demonstrate functional improvement with household activity, self care, and ADL.     Baseline:  Goal status: INITIAL   PLAN:   PT FREQUENCY: 1-2x/week   PT DURATION: 12 wks   PLANNED INTERVENTIONS: [id number]- PT Re-evaluation, 97110-Therapeutic exercises, 97530- Therapeutic activity, [id number]-  Neuromuscular re-education, 97535- Self Care, [id number]- Manual therapy, [id number]- Gait training, [phone]- Aquatic Therapy, 97014- Electrical stimulation (unattended), [id number]- Electrical stimulation (manual), [id number]- Ultrasound, Patient/Family education, Balance training, Stair training, Taping, Dry Needling, Joint mobilization, Spinal mobilization, Cryotherapy, and Moist heat   PLAN FOR NEXT SESSION: R  adductor ROM/mobility and strength         Silver Dross, PT 03/17/2024, 5:11 PM

## 2024-03-18 NOTE — Assessment & Plan Note (Signed)
 Incidental finding of 3 mm non-concerning Pulmonary nodule  on  Contrast CT of neck 04/2017 - CT chest 01/02/18 stable 3 mm nodule/ copd changes   She is out of the 15 year range for LDSCt but if cough remains refractory could consider HRCT to r/o bronchiecatasis  Advised to call in 6 weeks for f/u ct if not doing better on cough regimen.  Discussed in detail all the  indications, usual  risks and alternatives  relative to the benefits with patient who agrees to proceed with w/u as outlined.            Each maintenance medication was reviewed in detail including emphasizing most importantly the difference between maintenance and prns and under what circumstances the prns are to be triggered using an action plan format where appropriate.  Total time for H and P, chart review, counseling,  and generating customized AVS unique to this office visit / same day charting =  46 min for chronic  refractory respiratory  symptoms of uncertain etiology / pt not seen in > 3 y

## 2024-03-18 NOTE — Assessment & Plan Note (Signed)
 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   These are the same pts (now being labeled as having irritable larynx syndrome by some cough centers) who not infrequently have a history of having failed to tolerate ace inhibitors,  dry powder inhalers or biphosphonates or report having atypical/extraesophageal reflux symptoms(LPR) that don't respond to standard doses of PPI  and are easily confused as having aecopd or asthma flares by even experienced allergists/ pulmonologists (myself included).   Rx max gerd rx including use of hard rock candy to avoid throat clairng  Consider sinus CT and re-eval by allergy  next steps .

## 2024-03-19 ENCOUNTER — Ambulatory Visit: Payer: Self-pay | Admitting: Internal Medicine

## 2024-03-23 NOTE — Progress Notes (Signed)
  Call pt:  Reviewed cxr and no acute change so no change in recommendations made at ov  ----- Message ----- From: Interface, Rad Results In Sent: 03/19/2024   8:11 AM EDT To: Diamond Formica, MD

## 2024-03-24 ENCOUNTER — Ambulatory Visit (HOSPITAL_BASED_OUTPATIENT_CLINIC_OR_DEPARTMENT_OTHER): Admitting: Physical Therapy

## 2024-03-24 ENCOUNTER — Encounter (HOSPITAL_BASED_OUTPATIENT_CLINIC_OR_DEPARTMENT_OTHER): Payer: Self-pay | Admitting: Physical Therapy

## 2024-03-24 DIAGNOSIS — M6281 Muscle weakness (generalized): Secondary | ICD-10-CM

## 2024-03-24 DIAGNOSIS — M79651 Pain in right thigh: Secondary | ICD-10-CM

## 2024-03-24 DIAGNOSIS — M25551 Pain in right hip: Secondary | ICD-10-CM | POA: Diagnosis not present

## 2024-03-24 NOTE — Therapy (Signed)
 OUTPATIENT PHYSICAL THERAPY LOWER EXTREMITY TREATMENT   Patient Name: Desiree Snow MRN: [id number] DOB:05-Jan-1951, 73 y.o., female Today's Date: 03/24/2024  END OF SESSION:  PT End of Session - 03/24/24 1616     Visit Number 3    Number of Visits 12    Date for PT Re-Evaluation 06/01/24    Authorization Type [employer]    Authorization Time Period 6/2-8/25    PT Start Time 1615    PT Stop Time 1645   pt request   PT Time Calculation (min) 30 min    Activity Tolerance Patient tolerated treatment well    Behavior During Therapy [facility] for tasks assessed/performed          Past Medical History:  Diagnosis Date   Allergy     Anxiety    Arthritis    Blood transfusion without reported diagnosis    Cataract    Chicken pox    Colon polyps    Depression 1998   Postop effect after Amygdalohippocampetomy   Diverticulosis    Family history of uterine cancer 05/05/2021   GERD (gastroesophageal reflux disease)    H/O febrile seizure    as infant, x 1, List of AEDs tried: Dilantin, Mysoline, Tegretol, Gabapentine, Depakote,  Keppra, and Lamictal ,   Seizures (HCC)    resolved last seizure in 2002   Thyroid  goiter    Ulcer    small gastric ulcers found during endoscopy   Past Surgical History:  Procedure Laterality Date   BRAIN SURGERY  1998   Right Amygdalohippocampectomy   BREAST BIOPSY Right 07/29/2020   COMPLEX SCLEROSING LESION WITH ATYPICAL DUCTAL   BREAST BIOPSY Right 01/01/2020   COMPLEX SCLEROSING LESION    BREAST EXCISIONAL BIOPSY Right 09/20/2020   complex sclerosing   BREAST EXCISIONAL BIOPSY Right 03/01/2020   Favor IDC, CSL also considered   BREAST EXCISIONAL BIOPSY Left    ? Date  ?lipoma   BREAST LUMPECTOMY WITH RADIOACTIVE SEED LOCALIZATION Right 03/01/2020   Procedure: RIGHT BREAST LUMPECTOMY WITH RADIOACTIVE SEED LOCALIZATION;  Surgeon: Sim Dryer, MD;  Location: [facility];  Service: General;  Laterality: Right;   BREAST LUMPECTOMY  WITH RADIOACTIVE SEED LOCALIZATION Right 09/20/2020   Procedure: RIGHT BREAST LUMPECTOMY WITH RADIOACTIVE SEED LOCALIZATION;  Surgeon: Sim Dryer, MD;  Location: [facility];  Service: General;  Laterality: Right;   COSMETIC SURGERY  2012   Lifestyle Lift   EYE SURGERY     right cataract removal 09/18/23   EYE SURGERY Left 01/14/2024   cataract removal   JOINT REPLACEMENT  08/2021   Left total knee replacement   KNEE SURGERY  2021   LOBECTOMY FOR SEIZURE FOCUS  1998   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   MENISECTOMY Left    SPLENECTOMY  1961   TONSILLECTOMY AND ADENOIDECTOMY  1963   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   Procedure: TOTAL KNEE ARTHROPLASTY;  Surgeon: Winston Hawking, MD;  Location: [facility];  Service: Orthopedics;  Laterality: Left;   TRIGGER FINGER RELEASE Right    middle   WISDOM TOOTH EXTRACTION     Patient Active Problem List   Diagnosis Date Noted   Right thigh pain 01/07/2024   Routine general medical examination at a health care facility 01/07/2024   Right elbow pain 10/25/2023   Encounter for monitoring flecainide  therapy 09/10/2023   Paroxysmal atrial fibrillation (HCC) 09/10/2023   H/O total knee replacement, left 08/25/2021   Genetic testing 05/26/2021   Atypical ductal hyperplasia  of right breast 12/14/2020   Chronic rhinitis/ pnds > lifelong daytime cough 02/04/2019   DOE (dyspnea on exertion) 02/04/2019   Multinodular goiter 06/20/2017   Incidental pulmonary nodule 05/15/2017   Seizure disorder (HCC) 05/15/2017   GERD (gastroesophageal reflux disease) 04/12/2017   IFG (impaired fasting glucose) 04/12/2017    PCP: Salina Craver, MD   REFERRING PROVIDER: Adelia Homestead, MD   REFERRING DIAG:  Diagnosis  M25.551 (ICD-10-CM) - Pain of right hip    THERAPY DIAG:  Pain in right thigh  Muscle weakness (generalized)  Rationale for Evaluation and Treatment: Rehabilitation  ONSET  DATE: ~2019  SUBJECTIVE:   SUBJECTIVE STATEMENT:  Pt reports things are feeling better but squatting is still uncomfortable for home.   Eval:   Started 2019 at [facility] with excessive stretching movement in figure 4. Pt reports it has progressively worsened. Pt does not radiate to other areas and is focused to groin region. Pt is adamant in that this is not a R hip issue or a problem with muscular origin and insertions. Pt reports that it is very painful with R LE adduction and palpation to the medial thigh/groin. Pt has history of L LE lift in shoe. Denies NT. Pt notes footwear is hard to find due to narrow foot and causing R foot to move around. Denies cancer red flags. Pt will sometimes will have pain at night and will need tylenol  to sleep. Pt [employer] 1x/week for silver sneakers. 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     PERTINENT HISTORY: Anxiety, depression, seizures, DOE, L TKA, R ankle sprain  PAIN:  Are you having pain? Yes: NPRS scale: 2/10 at rest; 6-7/10 at worst  Pain location: R adductor Pain description: sharp Aggravating factors: squatting, cars, transfers, hip rotations Relieving factors: nothing  PRECAUTIONS: Other: seizures  RED FLAGS: None   WEIGHT BEARING RESTRICTIONS: No  FALLS:  Has patient fallen in last 6 months? Yes. Number of falls 1 January, tripped over a mat. 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  DIAGNOSTIC FINDINGS:   IMPRESSION: 1. 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 2. Diffuse degenerative irregularity and tears of the anterior superior and superior right acetabular labrum. 3. Very mild right greater  than left obturator externus origin muscle strains. No fluid bright tear. 4. Mild left common hamstring origin tendinosis. 5. Minimal left gluteus minimus insertional tendinosis. 6. Moderate pubic symphysis osteoarthritis. 7. Imaged only on a single large field-of-view coronal STIR sequence, there are two lesions within the left femoral diaphysis that are nonspecific but grossly compatible with remote infarcts. No acute fracture or cortical break is seen.     Electronically Signed   By: Bertina Broccoli M.D.   On: 01/27/2024 08:57  PATIENT SURVEYS:   Lower Extremity Functional Score: 33 / 80 = 41.3 % COGNITION: Overall cognitive status: Within functional limits for tasks assessed                         PALPATION: TTP of R proximal adductor group; mild hypertonicity;   LOWER EXTREMITY MMT:   MMT (HHD in lbs)  L eval R eval  Hip flexion 37.3 30.4 p!  Hip extension  Hip abduction 32.9 27.9  Hip adduction 21.3 15.3 p!  Hip internal rotation    Hip external rotation    Knee flexion    Knee extension 28.6 24.6  Ankle dorsiflexion      Ankle plantarflexion      Ankle inversion      Ankle eversion       (Blank rows = not tested)   LOWER EXTREMITY ROM: moderately limited in all planes but especially painful with active ADD and passive ABD  Specials Tests: positive FABER bilaterally Positive leg roll on R    GAIT: 34ft Assistive device utilized:N/A Level of assistance: Independence Comments: Mild toe out, decreased step length bilaterally     Transfers: painful adduction with supine to sit, unable to actively adductor without pain once in butterfly position                                                                                                                                TREATMENT DATE:    6/17  R hip ER 30 deg, R adductor strength 4-/5 with pain  STM R adductor magnus distal portion and medial HS  Exercises - Supine Hamstring Stretch  - 2 x daily -  7 x weekly - 1 sets - 3 reps - 30 hold - Supine Butterfly Groin Stretch  - 2 x daily - 7 x weekly - 1 sets - 3 reps - 30 hold - Deep Squat  - 1 x daily - 7 x weekly - 3 sets - 6 reps - Staggered Bridge  - 1 x daily - 7 x weekly - 3 sets - 3 reps - Forward T with Counter Support  - 1 x daily - 7 x weekly - 3 sets - 8 reps - Alternating Lateral Lunges  - 1 x daily - 7 x weekly - 3 sets - 10 reps  6/10  STM R adductor magnus distal portion  Groin stretch 30s 3x supine Standing adductor stretch 30s 3x Deep squat at rail 3x6 Step flexion groin stretch 10s 10x Leg swing 2x20 Wall RDL with foam roller on thighs 2x10  6/2  Exercises - Supine Hip Adduction Isometric with Ball  - 2 x daily - 7 x weekly - 2 sets - 10 reps - Supine Bridge with Mini Swiss Ball Between Knees  - 2 x daily - 7 x weekly - 2 sets - 10 reps - Supine Butterfly Groin Stretch  - 2 x daily - 7 x weekly - 1 sets - 3 reps - 30 hold   Thermotherapy, self pain management, activity tolerance and progressive exercise   PATIENT EDUCATION:  Education details:  acceptable levels of pain, anatomy, exercise progression, DOMS expectations, muscle firing,  envelope of function, HEP, POC  Person educated: Patient Education method: Explanation, Demonstration, Tactile cues, and Verbal cues Education comprehension: verbalized understanding, returned demonstration, verbal cues required, tactile cues required, and needs further education  HOME EXERCISE PROGRAM:   Access Code: [email or web address] URL: [email or web address] Date: 03/09/2024 Prepared by: Silver Dross      ASSESSMENT:   CLINICAL IMPRESSION: Pt with improvement in baseline pain at the adductor/groin but is still limited with full flexibility into ER/figure 4 position. Pt able to progress stretching as well as BW loaded motions today without increasing pain. HEP updated accordingly. Consider machine and weighted exercies at next as pt is able to tolerate BW CKC  exercise at this time. Consider a SL leg press motion as well as adductor machine as tolerated. Pt would benefit from continued skilled therapy in order to reach goals and maximize functional R LE strength and ROM for full return to normalized community mobility, exercise, and ADL.     OBJECTIVE IMPAIRMENTS: decreased activity tolerance, decreased endurance, decreased mobility, difficulty walking, decreased strength, and pain.    ACTIVITY LIMITATIONS: standing, transfers, and locomotion level   PARTICIPATION LIMITATIONS: cleaning, shopping, and community activity   PERSONAL FACTORS: Age, Fitness, 2+ comorbidities, and Time since onset of injury/illness/exacerbation are also affecting patient's functional outcome.    REHAB POTENTIAL: Good   CLINICAL DECISION MAKING: stable/uncomplicated   EVALUATION COMPLEXITY: Low     GOALS:     SHORT TERM GOALS: Target date: 04/20/2024      Pt will be independent and compliant with HEP for improved pain, strength, and function.  Baseline: Goal status: INITIAL     2.  Pt will be able to demonstrate ability to perform supine <> sit transfers without pain in order to demonstrate functional improvement in LE function for self-care and house hold duties.    Baseline:  Goal status: INITIAL   3. Pt will report at least 2 pt reduction on NPRS scale for pain in order to demonstrate functional improvement with household activity, self care, and ADL.      Baseline:  Goal status: INITIAL     LONG TERM GOALS: Target date: 06/01/2024      Pt will be independent and compliant with HEP for improved pain, strength, and function.  Baseline: Goal status: INITIAL   2.  Pt will be able to demonstrate at least 10 lb strength improvement in R adduction in order to demonstrate functional improvement in LE function for self-care and community mobility    Baseline:  Goal status: INITIAL   3.  Pt will be able to demonstrate DL squat/ STS to beyond parallel  depth without pain in order to demonstrate functional improvement in R LE strength for return to PLOF and exercise.    Baseline:  Goal status: INITIAL   4. Pt will report ability to getting in and out of car and bed without pain  in order to demonstrate functional improvement with household activity, self care, and ADL.     Baseline:  Goal status: INITIAL   PLAN:   PT FREQUENCY: 1-2x/week   PT DURATION: 12 wks   PLANNED INTERVENTIONS: [id number]- PT Re-evaluation, 97110-Therapeutic exercises, 97530- Therapeutic activity, [id number]- Neuromuscular re-education, 97535- Self Care, [id number]- Manual therapy, [id number]- Gait training, [phone]- Aquatic Therapy, 97014- Electrical stimulation (unattended), [id number]- Electrical stimulation (manual), [id number]- Ultrasound, Patient/Family education, Balance training, Stair training, Taping, Dry Needling, Joint mobilization, Spinal mobilization, Cryotherapy, and Moist heat   PLAN FOR NEXT SESSION: R  adductor ROM/mobility and strength         Silver Dross, PT 03/24/2024, 4:49 PM

## 2024-03-30 ENCOUNTER — Ambulatory Visit: Payer: Medicare Other | Admitting: Internal Medicine

## 2024-03-30 ENCOUNTER — Encounter: Payer: Self-pay | Admitting: Internal Medicine

## 2024-03-30 VITALS — BP 120/64 | HR 77 | Ht 66.0 in | Wt 196.6 lb

## 2024-03-30 DIAGNOSIS — E042 Nontoxic multinodular goiter: Secondary | ICD-10-CM

## 2024-03-30 DIAGNOSIS — Z77098 Contact with and (suspected) exposure to other hazardous, chiefly nonmedicinal, chemicals: Secondary | ICD-10-CM | POA: Diagnosis not present

## 2024-03-30 NOTE — Patient Instructions (Addendum)
 Please return in a year - you can send me a message to order the new thyroid  U/S beforehand.

## 2024-03-30 NOTE — Progress Notes (Signed)
 Patient ID: Desiree Snow, female   DOB: 09-Oct-1950, 73 y.o.   MRN: [id number]  HPI  Desiree Snow is a 73 y.o.-year-old female, returning for follow-up for multinodular goiter.  She previously saw Dr. Kassie, but last visit with me 1 year ago.  Interim history: Before last visit, she noticed that she needed more effort for getting her voice out while singing.  Also, her voice was more pitched.  However, since last visit, the symptoms have resolved. She had shortness of breath which was found to be related to a new dx of Afib.  She also had a TIA in the last 2 weeks.  She was taken off Evista  afterwards. On Eliquis .  Past thyroid  history: Patient was diagnosed with thyroid  nodules in 04/2017 -these were incidentally seen on a CT scan of her neck.  Thyroid  U/S (06/13/2017): She had 3 thyroid  nodules in the left lobe, of which the inferior and nodules met the criteria for biopsy, while the sup nodule qualified for 1 year follow-up  Bx (06/25/2017) of the L inf nodule (2.1 cm): benign  Thyroid  U/S (07/02/2018): Stable nodules  Thyroid  U/S (07/27/2019): Stable nodules  Thyroid  U/S (07/20/2020): Stable nodules, with the left mid thyroid  nodule not meeting criteria for follow-up or biopsy anymore  Thyroid  U/S (07/18/2021): Stable nodules  Thyroid  U/S (08/06/2022: Stable nodules except for the left inferior thyroid  nodule, which increased in size from 2.3 to 2.8 cm and now meets criteria for biopsy: Isthmus: 0.8 cm  Right lobe: 5.1 cm x 1.6 cm x 1.8 cm  Left lobe: 5.2 cm x 1.9 cm x 2.1 cm  ________________________________________________________   Estimated total number of nodules >/= 1 cm: 5 _________________________________________________________   Nodule labeled 1 in the isthmus, 1.1 cm, unchanged. Nodule remains TR 4 and has been stable now 5 years. Discontinuing surveillance may be reasonable.   Nodule labeled 2, superior right thyroid , 1.2 cm. Nodule remains TR 3,  smaller than prior, and does not meet criteria for surveillance.   Nodule labeled 3, mid right thyroid , 8 mm with spongiform characteristics and does not meet criteria for surveillance.   Nodule labeled 4, inferior right thyroid , 0.9 cm. Nodule remains unchanged and does not meet criteria for surveillance.   Nodule labeled 5, left superior thyroid , 1.8 cm, unchanged. Nodule remains TR 3 and appears stable for 5 years. Discontinuing surveillance may be reasonable.   Nodule labeled 6, mid left thyroid , 1.0 cm this nodule again demonstrates ill-defined borders, potentially a pseudo nodule.   Nodule labeled 7, inferior left thyroid , previously 2.3 cm, now 2.8 cm. Nodule remains TR 3 and now meets criteria for biopsy.    No adenopathy   IMPRESSION: Multinodular thyroid  goiter again demonstrated, as above.   The only significant change would be the larger size of nodule labeled 7 inferior left thyroid  (2.8 cm, TR 3) which now meets criteria for biopsy as designated by the newly established ACR TI-RADS criteria, and referral for biopsy is recommended.   The other previously documented nodules (labeled 1 and 5) have now demonstrated 5 years of stability and discontinuing surveillance may be reasonable.  Thyroid  FNA (10/11/2022) (L 2.8 cm nodule): Benign  I reviewed pt's thyroid  tests: Lab Results  Component Value Date   TSH 0.89 01/07/2024   TSH 1.240 07/08/2023   TSH 0.87 09/25/2022   TSH 0.69 10/27/2021   TSH 1.50 07/12/2021   TSH 1.46 07/12/2020   TSH 0.87 07/07/2019   TSH 1.27 12/25/2017   TSH  1.22 11/22/2016   TSH 1.29 11/24/2015   FREET4 1.13 07/08/2023   FREET4 0.81 09/25/2022   FREET4 0.76 10/27/2021   FREET4 0.84 07/12/2021   FREET4 0.74 07/12/2020    No FH of thyroid  ds. No FH of thyroid  cancer. No h/o radiation tx to head or neck. No recent contrast studies. No steroid use.  + A small amount of biotin supplement -in B complex.    Pt also has a history of  GERD, distant history of seizure disorder, obesity class I. She also tells me that her cat has hypothyroidism and she is putting methimazole gel on her years.   I advised her to use gloves.  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   Diverticulosis    Family history of uterine cancer 05/05/2021   GERD (gastroesophageal reflux disease)    H/O febrile seizure    as infant, x 1, List of AEDs tried: Dilantin, Mysoline, Tegretol, Gabapentine, Depakote,  Keppra, and Lamictal ,   Seizures (HCC)    resolved last seizure in 2002   Thyroid  goiter    Ulcer    small gastric ulcers found during endoscopy   Past Surgical History:  Procedure Laterality Date   BRAIN SURGERY  1998   Right Amygdalohippocampectomy   BREAST BIOPSY Right 07/29/2020   COMPLEX SCLEROSING LESION WITH ATYPICAL DUCTAL   BREAST BIOPSY Right 01/01/2020   COMPLEX SCLEROSING LESION    BREAST EXCISIONAL BIOPSY Right 09/20/2020   complex sclerosing   BREAST EXCISIONAL BIOPSY Right 03/01/2020   Favor IDC, CSL also considered   BREAST EXCISIONAL BIOPSY Left    ? Date  ?lipoma   BREAST LUMPECTOMY WITH RADIOACTIVE SEED LOCALIZATION Right 03/01/2020   Procedure: RIGHT BREAST LUMPECTOMY WITH RADIOACTIVE SEED LOCALIZATION;  Surgeon: Vanderbilt Ned, MD;  Location: [facility];  Service: General;  Laterality: Right;   BREAST LUMPECTOMY WITH RADIOACTIVE SEED LOCALIZATION Right 09/20/2020   Procedure: RIGHT BREAST LUMPECTOMY WITH RADIOACTIVE SEED LOCALIZATION;  Surgeon: Vanderbilt Ned, MD;  Location: [facility];  Service: General;  Laterality: Right;   COSMETIC SURGERY  2012   Lifestyle Lift   EYE SURGERY     right cataract removal 09/18/23   EYE SURGERY Left 01/14/2024   cataract removal    JOINT REPLACEMENT  08/2021   Left total knee replacement   KNEE SURGERY  2021   LOBECTOMY FOR SEIZURE FOCUS  1998   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   MENISECTOMY Left    SPLENECTOMY  1961   TONSILLECTOMY AND ADENOIDECTOMY  1963   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   Procedure: TOTAL KNEE ARTHROPLASTY;  Surgeon: Kay Kemps, MD;  Location: [facility];  Service: Orthopedics;  Laterality: Left;   TRIGGER FINGER RELEASE Right    middle   WISDOM TOOTH EXTRACTION     Social History   Socioeconomic History   Marital status: Single    Spouse name: Not on file   Number of children: 0   Years of education: Not on file   Highest education level: Master's degree (e.g., MA, MS, MEng, MEd, MSW, MBA)  Occupational History   Occupation: NP     Comment: NA  Tobacco Use   Smoking  status: Former    Current packs/day: 0.00    Average packs/day: 2.0 packs/day for 29.0 years (58.0 ttl pk-yrs)    Types: Cigarettes    Start date: 10/08/1973    Quit date: 12/10/2002    Years since quitting: 21.3    Passive exposure: Past   Smokeless tobacco: Never   Tobacco comments:    Decided I wanted to continue to breathe  Vaping Use   Vaping status: Never Used  Substance and Sexual Activity   Alcohol use: No   Drug use: No   Sexual activity: Not Currently    Birth control/protection: Post-menopausal  Other Topics Concern   Not on file  Social History Narrative   Lives alone   Caffeine- 2 c coffee   Social Drivers of Corporate investment banker Strain: Low Risk  (12/24/2023)   Overall Financial Resource Strain (CARDIA)    Difficulty of Paying Living Expenses: Not hard at all  Food Insecurity: No Food Insecurity (12/24/2023)   Hunger Vital Sign    Worried About Running Out of Food in the Last Year: Never true    Ran Out of Food in the Last Year: Never true  Transportation Needs: No Transportation Needs (12/24/2023)   PRAPARE - Administrator, Civil Service  (Medical): No    Lack of Transportation (Non-Medical): No  Physical Activity: Insufficiently Active (12/24/2023)   Exercise Vital Sign    Days of Exercise per Week: 1 day    Minutes of Exercise per Session: 60 min  Stress: No Stress Concern Present (12/24/2023)   [employer] of Occupational Health - Occupational Stress Questionnaire    Feeling of Stress : Not at all  Social Connections: Socially Isolated (12/24/2023)   Social Connection and Isolation Panel    Frequency of Communication with Friends and Family: More than three times a week    Frequency of Social Gatherings with Friends and Family: Once a week    Attends Religious Services: Never    Database administrator or Organizations: No    Attends Banker Meetings: Never    Marital Status: Never married  Intimate Partner Violence: Not At Risk (12/24/2023)   Humiliation, Afraid, Rape, and Kick questionnaire    Fear of Current or Ex-Partner: No    Emotionally Abused: No    Physically Abused: No    Sexually Abused: No   Current Outpatient Medications on File Prior to Visit  Medication Sig Dispense Refill   apixaban  (ELIQUIS ) 5 MG TABS tablet Take 1 tablet (5 mg total) by mouth 2 (two) times daily. 180 tablet 3   B Complex Vitamins (B COMPLEX 1 PO)      Calcium  Carbonate-Vit D-Min (CALCIUM  1200 PO) Take 1 tablet by mouth daily.     Cholecalciferol  (VITAMIN D3) 1000 units CAPS Take 1,000 Units by mouth daily.     CINNAMON  PO Take 100 mg by mouth at bedtime.     Coenzyme Q10 (CO Q-10) 100 MG CAPS Take 100 mg by mouth at bedtime.     Collagen-Vitamin C-Biotin (COLLAGEN PO) Biocell collagen-take 1 tablet by mouth daily Does not have Vitamin C in it     diltiazem  (CARDIZEM  CD) 120 MG 24 hr capsule TAKE 1 CAPSULE BY MOUTH EVERY DAY 30 capsule 7   famotidine  (PEPCID ) 20 MG tablet One after supper 30 tablet 11   flecainide  (TAMBOCOR ) 100 MG tablet Take 1 tablet (100 mg total) by mouth 2 (two) times daily. 180 tablet 1  lamoTRIgine  (LAMICTAL ) 100 MG tablet Take 1 tablet (100 mg total) by mouth 2 (two) times daily. 180 tablet 4   Magnesium  250 MG TABS Take 250 mg by mouth daily.     meloxicam  (MOBIC ) 15 MG tablet Take 1 tablet (15 mg total) by mouth daily. 30 tablet 2   Multiple Vitamin (MULTIVITAMIN) tablet Take 1 tablet by mouth daily.     pantoprazole  (PROTONIX ) 40 MG tablet Take 1 tablet (40 mg total) by mouth daily. Take 30-60 min before first meal of the day 30 tablet 2   TURMERIC PO Take 1 tablet by mouth daily.     No current facility-administered medications on file prior to visit.   Allergies  Allergen Reactions   Cetirizine  Other (See Comments)    Cognitive issues and lowers seizure threshold.   Kenalog [Triamcinolone ]     Felt very strange   Metronidazole  Other (See Comments)    Strange feeling and inability to function   Quinolones    Scopolamine Hives     on her fingers no itchng   Penicillins Rash   Family History  Problem Relation Age of Onset   Pneumonia Mother    Alcohol abuse Mother    Early death Mother    Dementia Father    Uterine cancer Maternal Aunt        dx before 78   Vision loss Maternal Aunt    Diabetes Maternal Uncle        x 2   Hypertension Maternal Grandmother    Stroke Maternal Grandmother    Clotting disorder Paternal Grandmother    Dementia Paternal Grandfather    Lung cancer Cousin        paternal female cousin; dx after 64   Cancer Maternal Aunt    Goiter Neg Hx    Thyroid  cancer Neg Hx    Thyroid  nodules Neg Hx    Thyroid  disease Neg Hx    Colon cancer Neg Hx    Esophageal cancer Neg Hx    Rectal cancer Neg Hx    Stomach cancer Neg Hx    PE: BP 120/64   Pulse 77   Ht 5' 6 (1.676 m)   Wt 196 lb 9.6 oz (89.2 kg)   SpO2 97%   BMI 31.73 kg/m  Wt Readings from Last 3 Encounters:  03/30/24 196 lb 9.6 oz (89.2 kg)  03/17/24 198 lb (89.8 kg)  03/03/24 196 lb (88.9 kg)   Constitutional: overweight, in NAD Eyes:  EOMI, no  exophthalmos ENT: no neck masses, but more fullness in the right lobe of her thyroid  no cervical lymphadenopathy Cardiovascular: RRR, No MRG Respiratory: CTA B Musculoskeletal: no deformities Skin:no rashes Neurological: no tremor with outstretched hands  ASSESSMENT: 1. 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 Thyroid  nodules - Patient with history of multiple thyroid  nodules that have been followed with annual ultrasounds since 2018.  On the latest thyroid  ultrasound from 07/2022, nodules appeared to be stable except for the left inferior thyroid  nodule, which has increased in size from 2.3 to 2.8 cm.  This nodule was large and hypoechoic, but otherwise did not have other risk factors for cancer including microcalcifications, internal blood flow, taller than wide distribution and irregular contours.  Also, she does not have a family history of thyroid  cancer or personal history of radiation therapy to head or neck to increase her own risk of thyroid  cancer.  The nodule was biopsied in 2018 with benign results.  She  had another FNA of the left thyroid  nodule in 10/2022 and the results were benign.  We discussed that after 2 benign biopsies, the risk of cancer are minute. -At last visit, she described the voice giving out when singing and also higher pitched tonality. We did discuss at that time about the possibility of checking a barium swallow study to see the extent of the thyroid  compression on the esophagus.  However, we discussed about the possibility of thyroidectomy and the fact that this could affect her singing.  Also, it had an approximately 25% risk of becoming hypothyroid after hemithyroidectomy.  Therefore, we decided to avoid surgery and did not proceed with a barium swallow at that time.  I did recommend evaluation by ENT for postnasal drip and also to check her vocal cords.  She did not see them since last visit, as she mentions that her vocal symptoms actually resolved.   We did discuss about continuing to keep an eye on her nodules and nowadays, there is the possibility of radiofrequency ablation, and is starting to gain terrain and hopefully will be covered by insurance, in case her nodules become bothersome. - At today's visit, she has no nodules felt on palpation of her neck, but I feel more fullness on the right side of her thyroid .  Also, no dysphagia, odynophagia, choking. -Plan to check another thyroid  ultrasound next year-we discussed about sending me a message so I can order it before our next visit so we can review the report and images together - I will see her back in a year  2.  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  I strongly advised her to use gloves.  As of now, she does not touch the cat anymore, if she got a different dispenser for the gel. - We repeated her TFTs and these were normal.  Previous TFTs were normal in 01/2024 - No signs or symptoms of hypo~ or hyperthyroidism at today's visit - No further investigation is needed for this  Lela Fendt, MD PhD [facility]

## 2024-03-31 ENCOUNTER — Ambulatory Visit (HOSPITAL_BASED_OUTPATIENT_CLINIC_OR_DEPARTMENT_OTHER): Admitting: Physical Therapy

## 2024-03-31 ENCOUNTER — Encounter (HOSPITAL_BASED_OUTPATIENT_CLINIC_OR_DEPARTMENT_OTHER): Payer: Self-pay | Admitting: Physical Therapy

## 2024-03-31 DIAGNOSIS — M25551 Pain in right hip: Secondary | ICD-10-CM | POA: Diagnosis not present

## 2024-03-31 DIAGNOSIS — M6281 Muscle weakness (generalized): Secondary | ICD-10-CM

## 2024-03-31 DIAGNOSIS — M79651 Pain in right thigh: Secondary | ICD-10-CM | POA: Diagnosis not present

## 2024-03-31 NOTE — Therapy (Signed)
 OUTPATIENT PHYSICAL THERAPY LOWER EXTREMITY TREATMENT   Patient Name: Desiree Snow MRN: [id number] DOB:[birth year], 73 y.o., female Today's Date: 03/31/2024  END OF SESSION:  PT End of Session - 03/31/24 1625     Visit Number 4    Number of Visits 12    Date for PT Re-Evaluation 06/01/24    Authorization Type [employer]    Authorization Time Period 6/2-8/25    PT Start Time 1619    PT Stop Time 1653    PT Time Calculation (min) 34 min    Activity Tolerance Patient tolerated treatment well    Behavior During Therapy [facility] for tasks assessed/performed           Past Medical History:  Diagnosis Date   Allergy     Anxiety    Arthritis    Blood transfusion without reported diagnosis    Cataract    Chicken pox    Colon polyps    Depression 1998   Postop effect after Amygdalohippocampetomy   Diverticulosis    Family history of uterine cancer 05/05/2021   GERD (gastroesophageal reflux disease)    H/O febrile seizure    as infant, x 1, List of AEDs tried: Dilantin, Mysoline, Tegretol, Gabapentine, Depakote,  Keppra, and Lamictal ,   Seizures (HCC)    resolved last seizure in 2002   Thyroid  goiter    Ulcer    small gastric ulcers found during endoscopy   Past Surgical History:  Procedure Laterality Date   BRAIN SURGERY  1998   Right Amygdalohippocampectomy   BREAST BIOPSY Right 07/29/2020   COMPLEX SCLEROSING LESION WITH ATYPICAL DUCTAL   BREAST BIOPSY Right 01/01/2020   COMPLEX SCLEROSING LESION    BREAST EXCISIONAL BIOPSY Right 09/20/2020   complex sclerosing   BREAST EXCISIONAL BIOPSY Right 03/01/2020   Favor IDC, CSL also considered   BREAST EXCISIONAL BIOPSY Left    ? Date  ?lipoma   BREAST LUMPECTOMY WITH RADIOACTIVE SEED LOCALIZATION Right 03/01/2020   Procedure: RIGHT BREAST LUMPECTOMY WITH RADIOACTIVE SEED LOCALIZATION;  Surgeon: Vanderbilt Ned, MD;  Location: [facility];  Service: General;  Laterality: Right;   BREAST LUMPECTOMY WITH  RADIOACTIVE SEED LOCALIZATION Right 09/20/2020   Procedure: RIGHT BREAST LUMPECTOMY WITH RADIOACTIVE SEED LOCALIZATION;  Surgeon: Vanderbilt Ned, MD;  Location: [facility];  Service: General;  Laterality: Right;   COSMETIC SURGERY  2012   Lifestyle Lift   EYE SURGERY     right cataract removal 09/18/23   EYE SURGERY Left 01/14/2024   cataract removal   JOINT REPLACEMENT  08/2021   Left total knee replacement   KNEE SURGERY  2021   LOBECTOMY FOR SEIZURE FOCUS  1998   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   MENISECTOMY Left    SPLENECTOMY  1961   TONSILLECTOMY AND ADENOIDECTOMY  1963   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   Procedure: TOTAL KNEE ARTHROPLASTY;  Surgeon: Kay Kemps, MD;  Location: [facility];  Service: Orthopedics;  Laterality: Left;   TRIGGER FINGER RELEASE Right    middle   WISDOM TOOTH EXTRACTION     Patient Active Problem List   Diagnosis Date Noted   Right thigh pain 01/07/2024   Routine general medical examination at a health care facility 01/07/2024   Right elbow pain 10/25/2023   Encounter for monitoring flecainide  therapy 09/10/2023   Paroxysmal atrial fibrillation (HCC) 09/10/2023   H/O total knee replacement, left 08/25/2021   Genetic testing 05/26/2021   Atypical ductal hyperplasia of right  breast 12/14/2020   Chronic rhinitis/ pnds > lifelong daytime cough 02/04/2019   DOE (dyspnea on exertion) 02/04/2019   Multinodular goiter 06/20/2017   Incidental pulmonary nodule 05/15/2017   Seizure disorder (HCC) 05/15/2017   GERD (gastroesophageal reflux disease) 04/12/2017   IFG (impaired fasting glucose) 04/12/2017    PCP: Cleatrice Ludie SAUNDERS, MD   REFERRING PROVIDER: Rollene Almarie LABOR, MD   REFERRING DIAG:  Diagnosis  M25.551 (ICD-10-CM) - Pain of right hip    THERAPY DIAG:  Pain in right thigh  Muscle weakness (generalized)  Rationale for Evaluation and Treatment: Rehabilitation  ONSET DATE:  ~2019  SUBJECTIVE:   SUBJECTIVE STATEMENT:  Pt states that the HEP is too much to do. Pt would like to reduce the number of exercises. Pt states the hip generally feels better but hip ABD motions still hurt. Pt reports she has been doing hip ABD instead of lunging bc of the picture.   Eval:   Started 2019 at [facility] with excessive stretching movement in figure 4. Pt reports it has progressively worsened. Pt does not radiate to other areas and is focused to groin region. Pt is adamant in that this is not a R hip issue or a problem with muscular origin and insertions. Pt reports that it is very painful with R LE adduction and palpation to the medial thigh/groin. Pt has history of L LE lift in shoe. Denies NT. Pt notes footwear is hard to find due to narrow foot and causing R foot to move around. Denies cancer red flags. Pt will sometimes will have pain at night and will need tylenol  to sleep. Pt [employer] 1x/week for silver sneakers. 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     PERTINENT HISTORY: Anxiety, depression, seizures, DOE, L TKA, R ankle sprain  PAIN:  Are you having pain? Yes: NPRS scale: 1/10 at rest; 6-7/10 at worst  Pain location: R adductor Pain description: sharp Aggravating factors: squatting, cars, transfers, hip rotations Relieving factors: nothing  PRECAUTIONS: Other: seizures  RED FLAGS: None   WEIGHT BEARING RESTRICTIONS: No  FALLS:  Has patient fallen in last 6 months? Yes. Number of falls 1 January, tripped over a mat. 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  DIAGNOSTIC FINDINGS:   IMPRESSION: 1. 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 2. Diffuse degenerative irregularity and tears of the anterior superior and superior right acetabular labrum. 3. Very mild right greater than left obturator externus origin muscle strains. No fluid bright tear. 4. Mild left common hamstring origin tendinosis. 5. Minimal left gluteus minimus insertional tendinosis. 6. Moderate pubic symphysis osteoarthritis. 7. Imaged only on a single large field-of-view coronal STIR sequence, there are two lesions within the left femoral diaphysis that are nonspecific but grossly compatible with remote infarcts. No acute fracture or cortical break is seen.     Electronically Signed   By: Tanda Lyons M.D.   On: 01/27/2024 08:57  PATIENT SURVEYS:   Lower Extremity Functional Score: 33 / 80 = 41.3 % COGNITION: Overall cognitive status: Within functional limits for tasks assessed                         PALPATION: TTP of R proximal  adductor group; mild hypertonicity;   LOWER EXTREMITY MMT:   MMT (HHD in lbs)  L eval R eval  Hip flexion 37.3 30.4 p!  Hip extension    Hip abduction 32.9 27.9  Hip adduction 21.3 15.3 p!  Hip internal rotation    Hip external rotation    Knee flexion    Knee extension 28.6 24.6  Ankle dorsiflexion      Ankle plantarflexion      Ankle inversion      Ankle eversion       (Blank rows = not tested)   LOWER EXTREMITY ROM: moderately limited in all planes but especially painful with active ADD and passive ABD  Specials Tests: positive FABER bilaterally Positive leg roll on R    GAIT: 42ft Assistive device utilized:N/A Level of assistance: Independence Comments: Mild toe out, decreased step length bilaterally     Transfers: painful adduction with supine to sit, unable to actively adductor without pain once in butterfly position                                                                                                                                TREATMENT DATE:     6/24  Nustep warm up lvl 5 5 min  Squat at rail 3x8 Fwd T at rail 3x8 Supine HS stretch 10s 10x Self STM with foam roller to adductor/groin Supine groin stretch 30s 3x  6/17  R hip ER 30 deg, R adductor strength 4-/5 with pain  STM R adductor magnus distal portion and medial HS  Exercises - Supine Hamstring Stretch  - 2 x daily - 7 x weekly - 1 sets - 3 reps - 30 hold - Supine Butterfly Groin Stretch  - 2 x daily - 7 x weekly - 1 sets - 3 reps - 30 hold - Deep Squat  - 1 x daily - 7 x weekly - 3 sets - 6 reps - Staggered Bridge  - 1 x daily - 7 x weekly - 3 sets - 3 reps - Forward T with Counter Support  - 1 x daily - 7 x weekly - 3 sets - 8 reps - Alternating Lateral Lunges  - 1 x daily - 7 x weekly - 3 sets - 10 reps  6/10  STM R adductor magnus distal portion  Groin stretch 30s 3x supine Standing adductor stretch 30s 3x Deep squat at rail 3x6 Step flexion groin stretch 10s 10x Leg swing 2x20 Wall RDL with foam roller on thighs 2x10  6/2  Exercises - Supine Hip Adduction Isometric with Ball  - 2 x daily - 7 x weekly - 2 sets - 10 reps - Supine Bridge with Mini Swiss Ball Between Knees  - 2 x daily - 7 x weekly - 2 sets - 10 reps - Supine Butterfly Groin Stretch  - 2 x daily - 7 x weekly - 1 sets - 3 reps - 30  hold   Thermotherapy, self pain management, activity tolerance and progressive exercise   PATIENT EDUCATION:  Education details:  acceptable levels of pain, anatomy, exercise progression, DOMS expectations, muscle firing,  envelope of function, HEP, POC  Person educated: Patient Education method: Explanation, Demonstration, Tactile cues, and Verbal cues Education comprehension: verbalized understanding, returned demonstration, verbal cues required, tactile cues required, and needs further education     HOME EXERCISE PROGRAM:   Access Code: [email or web address] URL: [email or web address] Date: 03/09/2024 Prepared by: Dale Call       ASSESSMENT:   CLINICAL IMPRESSION: Pt session used to taper HEP as pt reports difficulty with compliance. Pt technique differing from previously reviewed. Pt's groin pain more proximal today than previous session. Plan for pt to continue with modified HEP at this time. Consider Mulligan mobilzation for the groin as tolerated at next as well as adductor machine. Pt would benefit from continued skilled therapy in order to reach goals and maximize functional R LE strength and ROM for full return to normalized community mobility, exercise, and ADL.     OBJECTIVE IMPAIRMENTS: decreased activity tolerance, decreased endurance, decreased mobility, difficulty walking, decreased strength, and pain.    ACTIVITY LIMITATIONS: standing, transfers, and locomotion level   PARTICIPATION LIMITATIONS: cleaning, shopping, and community activity   PERSONAL FACTORS: Age, Fitness, 2+ comorbidities, and Time since onset of injury/illness/exacerbation are also affecting patient's functional outcome.    REHAB POTENTIAL: Good   CLINICAL DECISION MAKING: stable/uncomplicated   EVALUATION COMPLEXITY: Low     GOALS:     SHORT TERM GOALS: Target date: 04/20/2024      Pt will be independent and compliant with HEP for improved pain, strength, and function.  Baseline: Goal status: ongoing     2.  Pt will be able to demonstrate ability to perform supine <> sit transfers without pain in order to demonstrate functional improvement in LE function for self-care and house hold duties.    Baseline:  Goal status: ongoing   3. Pt will report at least 2 pt reduction on NPRS scale for pain in order to demonstrate functional improvement with household activity, self care, and ADL.      Baseline:  Goal status: MET     LONG TERM GOALS: Target date: 06/01/2024      Pt will be independent and compliant with HEP for improved pain, strength, and function.  Baseline: Goal status: INITIAL   2.  Pt will be able to  demonstrate at least 10 lb strength improvement in R adduction in order to demonstrate functional improvement in LE function for self-care and community mobility    Baseline:  Goal status: INITIAL   3.  Pt will be able to demonstrate DL squat/ STS to beyond parallel depth without pain in order to demonstrate functional improvement in R LE strength for return to PLOF and exercise.    Baseline:  Goal status: INITIAL   4. Pt will report ability to getting in and out of car and bed without pain  in order to demonstrate functional improvement with household activity, self care, and ADL.     Baseline:  Goal status: INITIAL   PLAN:   PT FREQUENCY: 1-2x/week   PT DURATION: 12 wks   PLANNED INTERVENTIONS: [id number]- PT Re-evaluation, 97110-Therapeutic exercises, 97530- Therapeutic activity, [id number]- Neuromuscular re-education, 97535- Self Care, [id number]- Manual therapy, [id number]- Gait training, [phone]- Aquatic Therapy, 97014- Electrical stimulation (unattended), [id number]- Electrical stimulation (manual), [id number]- Ultrasound, Patient/Family education, Balance training, Stair training, Taping, Dry Needling, Joint mobilization, Spinal  mobilization, Cryotherapy, and Moist heat   PLAN FOR NEXT SESSION: R  adductor ROM/mobility and strength         Dale Call, PT 03/31/2024, 4:58 PM

## 2024-04-03 ENCOUNTER — Encounter: Payer: Self-pay | Admitting: Nurse Practitioner

## 2024-04-06 ENCOUNTER — Encounter (HOSPITAL_BASED_OUTPATIENT_CLINIC_OR_DEPARTMENT_OTHER): Payer: Self-pay

## 2024-04-06 ENCOUNTER — Ambulatory Visit (HOSPITAL_BASED_OUTPATIENT_CLINIC_OR_DEPARTMENT_OTHER): Admitting: Physical Therapy

## 2024-04-13 ENCOUNTER — Ambulatory Visit (HOSPITAL_BASED_OUTPATIENT_CLINIC_OR_DEPARTMENT_OTHER): Attending: Family Medicine | Admitting: Physical Therapy

## 2024-04-13 ENCOUNTER — Encounter (HOSPITAL_BASED_OUTPATIENT_CLINIC_OR_DEPARTMENT_OTHER): Payer: Self-pay | Admitting: Physical Therapy

## 2024-04-13 DIAGNOSIS — M79651 Pain in right thigh: Secondary | ICD-10-CM | POA: Diagnosis not present

## 2024-04-13 DIAGNOSIS — M6281 Muscle weakness (generalized): Secondary | ICD-10-CM | POA: Diagnosis not present

## 2024-04-13 NOTE — Therapy (Signed)
 OUTPATIENT PHYSICAL THERAPY LOWER EXTREMITY TREATMENT   Patient Name: Desiree Snow MRN: [id number] DOB:29-May-1951, 73 y.o., female Today's Date: 04/13/2024  END OF SESSION:  PT End of Session - 04/13/24 1408     Visit Number 5    Number of Visits 12    Date for PT Re-Evaluation 06/01/24    Authorization Type U[employer]    Authorization Time Period 6/2-8/25    PT Start Time 1400    PT Stop Time 1433    PT Time Calculation (min) 33 min    Activity Tolerance Patient tolerated treatment well    Behavior During Therapy WFL for tasks assessed/performed            Past Medical History:  Diagnosis Date   Allergy     Anxiety    Arthritis    Blood transfusion without reported diagnosis    Cataract    Chicken pox    Colon polyps    Depression 1998   Postop effect after Amygdalohippocampetomy   Diverticulosis    Family history of uterine cancer 05/05/2021   GERD (gastroesophageal reflux disease)    H/O febrile seizure    as infant, x 1, List of AEDs tried: Dilantin, Mysoline, Tegretol, Gabapentine, Depakote,  Keppra, and Lamictal ,   Seizures (HCC)    resolved last seizure in 2002   Thyroid  goiter    Ulcer    small gastric ulcers found during endoscopy   Past Surgical History:  Procedure Laterality Date   BRAIN SURGERY  1998   Right Amygdalohippocampectomy   BREAST BIOPSY Right 07/29/2020   COMPLEX SCLEROSING LESION WITH ATYPICAL DUCTAL   BREAST BIOPSY Right 01/01/2020   COMPLEX SCLEROSING LESION    BREAST EXCISIONAL BIOPSY Right 09/20/2020   complex sclerosing   BREAST EXCISIONAL BIOPSY Right 03/01/2020   Favor IDC, CSL also considered   BREAST EXCISIONAL BIOPSY Left    ? Date  ?lipoma   BREAST LUMPECTOMY WITH RADIOACTIVE SEED LOCALIZATION Right 03/01/2020   Procedure: RIGHT BREAST LUMPECTOMY WITH RADIOACTIVE SEED LOCALIZATION;  Surgeon: Vanderbilt Ned, MD;  Location: [facility];  Service: General;  Laterality: Right;   BREAST LUMPECTOMY WITH  RADIOACTIVE SEED LOCALIZATION Right 09/20/2020   Procedure: RIGHT BREAST LUMPECTOMY WITH RADIOACTIVE SEED LOCALIZATION;  Surgeon: Vanderbilt Ned, MD;  Location: [facility];  Service: General;  Laterality: Right;   COSMETIC SURGERY  2012   Lifestyle Lift   EYE SURGERY     right cataract removal 09/18/23   EYE SURGERY Left 01/14/2024   cataract removal   JOINT REPLACEMENT  08/2021   Left total knee replacement   KNEE SURGERY  2021   LOBECTOMY FOR SEIZURE FOCUS  1998   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   MENISECTOMY Left    SPLENECTOMY  1961   TONSILLECTOMY AND ADENOIDECTOMY  1963   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   Procedure: TOTAL KNEE ARTHROPLASTY;  Surgeon: Kay Kemps, MD;  Location: [facility];  Service: Orthopedics;  Laterality: Left;   TRIGGER FINGER RELEASE Right    middle   WISDOM TOOTH EXTRACTION     Patient Active Problem List   Diagnosis Date Noted   Right thigh pain 01/07/2024   Routine general medical examination at a health care facility 01/07/2024   Right elbow pain 10/25/2023   Encounter for monitoring flecainide  therapy 09/10/2023   Paroxysmal atrial fibrillation (HCC) 09/10/2023   H/O total knee replacement, left 08/25/2021   Genetic testing 05/26/2021   Atypical ductal hyperplasia of  right breast 12/14/2020   Chronic rhinitis/ pnds > lifelong daytime cough 02/04/2019   DOE (dyspnea on exertion) 02/04/2019   Multinodular goiter 06/20/2017   Incidental pulmonary nodule 05/15/2017   Seizure disorder (HCC) 05/15/2017   GERD (gastroesophageal reflux disease) 04/12/2017   IFG (impaired fasting glucose) 04/12/2017    PCP: Cleatrice Ludie SAUNDERS, MD   REFERRING PROVIDER: Rollene Almarie LABOR, MD   REFERRING DIAG:  Diagnosis  M25.551 (ICD-10-CM) - Pain of right hip    THERAPY DIAG:  Pain in right thigh  Muscle weakness (generalized)  Rationale for Evaluation and Treatment: Rehabilitation  ONSET DATE:  ~2019  SUBJECTIVE:   SUBJECTIVE STATEMENT:  Pt notes that the pain intensity has improved. Still hurts to get into passenger side of the car   Eval:   Started 2019 at [facility] with excessive stretching movement in figure 4. Pt reports it has progressively worsened. Pt does not radiate to other areas and is focused to groin region. Pt is adamant in that this is not a R hip issue or a problem with muscular origin and insertions. Pt reports that it is very painful with R LE adduction and palpation to the medial thigh/groin. Pt has history of L LE lift in shoe. Denies NT. Pt notes footwear is hard to find due to narrow foot and causing R foot to move around. Denies cancer red flags. Pt will sometimes will have pain at night and will need tylenol  to sleep. Pt [employer] 1x/week for silver sneakers. 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     PERTINENT HISTORY: Anxiety, depression, seizures, DOE, L TKA, R ankle sprain  PAIN:  Are you having pain? Yes: NPRS scale: 1/10 at rest; 5/10 at worst  Pain location: R adductor Pain description: sharp Aggravating factors: squatting, cars, transfers, hip rotations Relieving factors: nothing  PRECAUTIONS: Other: seizures  RED FLAGS: None   WEIGHT BEARING RESTRICTIONS: No  FALLS:  Has patient fallen in last 6 months? Yes. Number of falls 1 January, tripped over a mat. 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  DIAGNOSTIC FINDINGS:   IMPRESSION: 1. 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 2. Diffuse degenerative irregularity and tears of the anterior superior and superior right acetabular labrum. 3. Very mild right  greater than left obturator externus origin muscle strains. No fluid bright tear. 4. Mild left common hamstring origin tendinosis. 5. Minimal left gluteus minimus insertional tendinosis. 6. Moderate pubic symphysis osteoarthritis. 7. Imaged only on a single large field-of-view coronal STIR sequence, there are two lesions within the left femoral diaphysis that are nonspecific but grossly compatible with remote infarcts. No acute fracture or cortical break is seen.     Electronically Signed   By: Tanda Lyons M.D.   On: 01/27/2024 08:57  PATIENT SURVEYS:   Lower Extremity Functional Score: 33 / 80 = 41.3 % COGNITION: Overall cognitive status: Within functional limits for tasks assessed                         PALPATION: TTP of R proximal adductor group; mild hypertonicity;   LOWER EXTREMITY MMT:   MMT (HHD in lbs)  L eval R eval  Hip flexion 37.3 30.4 p!  Hip  extension    Hip abduction 32.9 27.9  Hip adduction 21.3 15.3 p!  Hip internal rotation    Hip external rotation    Knee flexion    Knee extension 28.6 24.6  Ankle dorsiflexion      Ankle plantarflexion      Ankle inversion      Ankle eversion       (Blank rows = not tested)   LOWER EXTREMITY ROM: moderately limited in all planes but especially painful with active ADD and passive ABD  Specials Tests: positive FABER bilaterally Positive leg roll on R    GAIT: 64ft Assistive device utilized:N/A Level of assistance: Independence Comments: Mild toe out, decreased step length bilaterally     Transfers: painful adduction with supine to sit, unable to actively adductor without pain once in butterfly position                                                                                                                                TREATMENT DATE:    7/7 Mulligan lateral mob grade III R hip  Shuttle leg press (frog position) 3x8 100lbs RTB clam 2x10  Side to side lunge 2x8 (small range) Adductor machine  2x10 3s holds    6/24  Nustep warm up lvl 5 5 min  Squat at rail 3x8 Fwd T at rail 3x8 Supine HS stretch 10s 10x Self STM with foam roller to adductor/groin Supine groin stretch 30s 3x  6/17  R hip ER 30 deg, R adductor strength 4-/5 with pain  STM R adductor magnus distal portion and medial HS  Exercises - Supine Hamstring Stretch  - 2 x daily - 7 x weekly - 1 sets - 3 reps - 30 hold - Supine Butterfly Groin Stretch  - 2 x daily - 7 x weekly - 1 sets - 3 reps - 30 hold - Deep Squat  - 1 x daily - 7 x weekly - 3 sets - 6 reps - Staggered Bridge  - 1 x daily - 7 x weekly - 3 sets - 3 reps - Forward T with Counter Support  - 1 x daily - 7 x weekly - 3 sets - 8 reps - Alternating Lateral Lunges  - 1 x daily - 7 x weekly - 3 sets - 10 reps  6/10  STM R adductor magnus distal portion  Groin stretch 30s 3x supine Standing adductor stretch 30s 3x Deep squat at rail 3x6 Step flexion groin stretch 10s 10x Leg swing 2x20 Wall RDL with foam roller on thighs 2x10  6/2  Exercises - Supine Hip Adduction Isometric with Ball  - 2 x daily - 7 x weekly - 2 sets - 10 reps - Supine Bridge with Mini Swiss Ball Between Knees  - 2 x daily - 7 x weekly - 2 sets - 10 reps - Supine Butterfly Groin Stretch  - 2 x daily - 7 x weekly -  1 sets - 3 reps - 30 hold   Thermotherapy, self pain management, activity tolerance and progressive exercise   PATIENT EDUCATION:  Education details:  acceptable levels of pain, anatomy, exercise progression, DOMS expectations, muscle firing,  envelope of function, HEP, POC  Person educated: Patient Education method: Explanation, Demonstration, Tactile cues, and Verbal cues Education comprehension: verbalized understanding, returned demonstration, verbal cues required, tactile cues required, and needs further education     HOME EXERCISE PROGRAM:   Access Code: [email or web address] URL: [email or web address] Date: 03/09/2024 Prepared by: Dale Call       ASSESSMENT:   CLINICAL IMPRESSION: Pt with reduction of total pain since last session but still has irritation of the groin with AROM to mid-end range movements. Stiffness and pain improves with belted mobilizations of the R hip/groin. Pt HEP progressed and updated for more targeted adductor loading and reciprocal inhibition. Plan for pt to decrease freq of visits as she will be performing HEP. Consider small box on table [place]enhagen plank or modified leg swings at next. Pt would benefit from continued skilled therapy in order to reach goals and maximize functional R LE strength and ROM for full return to normalized community mobility, exercise, and ADL.     OBJECTIVE IMPAIRMENTS: decreased activity tolerance, decreased endurance, decreased mobility, difficulty walking, decreased strength, and pain.    ACTIVITY LIMITATIONS: standing, transfers, and locomotion level   PARTICIPATION LIMITATIONS: cleaning, shopping, and community activity   PERSONAL FACTORS: Age, Fitness, 2+ comorbidities, and Time since onset of injury/illness/exacerbation are also affecting patient's functional outcome.    REHAB POTENTIAL: Good   CLINICAL DECISION MAKING: stable/uncomplicated   EVALUATION COMPLEXITY: Low     GOALS:     SHORT TERM GOALS: Target date: 04/20/2024      Pt will be independent and compliant with HEP for improved pain, strength, and function.  Baseline: Goal status: ongoing     2.  Pt will be able to demonstrate ability to perform supine <> sit transfers without pain in order to demonstrate functional improvement in LE function for self-care and house hold duties.    Baseline:  Goal status: ongoing   3. Pt will report at least 2 pt reduction on NPRS scale for pain in order to demonstrate functional improvement with household activity, self care, and ADL.      Baseline:  Goal status: MET     LONG TERM GOALS: Target date: 06/01/2024      Pt will be independent and compliant  with HEP for improved pain, strength, and function.  Baseline: Goal status: INITIAL   2.  Pt will be able to demonstrate at least 10 lb strength improvement in R adduction in order to demonstrate functional improvement in LE function for self-care and community mobility    Baseline:  Goal status: INITIAL   3.  Pt will be able to demonstrate DL squat/ STS to beyond parallel depth without pain in order to demonstrate functional improvement in R LE strength for return to PLOF and exercise.    Baseline:  Goal status: INITIAL   4. Pt will report ability to getting in and out of car and bed without pain  in order to demonstrate functional improvement with household activity, self care, and ADL.     Baseline:  Goal status: INITIAL   PLAN:   PT FREQUENCY: 1-2x/week   PT DURATION: 12 wks   PLANNED INTERVENTIONS: [id number]- PT Re-evaluation, 97110-Therapeutic exercises, 97530- Therapeutic activity, [id number]- Neuromuscular re-education, 97535- Self Care, [id number]- Manual therapy, [id number]-  Gait training, 02886- Aquatic Therapy, [phone]- Electrical stimulation (unattended), [id number]- Electrical stimulation (manual), [id number]- Ultrasound, Patient/Family education, Balance training, Stair training, Taping, Dry Needling, Joint mobilization, Spinal mobilization, Cryotherapy, and Moist heat   PLAN FOR NEXT SESSION: R  adductor ROM/mobility and strength         Dale Call, PT 04/13/2024, 2:52 PM

## 2024-04-21 ENCOUNTER — Inpatient Hospital Stay: Attending: Hematology

## 2024-04-21 ENCOUNTER — Other Ambulatory Visit: Payer: Self-pay

## 2024-04-21 ENCOUNTER — Other Ambulatory Visit: Payer: Self-pay | Admitting: Nurse Practitioner

## 2024-04-21 DIAGNOSIS — N6091 Unspecified benign mammary dysplasia of right breast: Secondary | ICD-10-CM | POA: Diagnosis not present

## 2024-04-21 DIAGNOSIS — D509 Iron deficiency anemia, unspecified: Secondary | ICD-10-CM

## 2024-04-21 DIAGNOSIS — E611 Iron deficiency: Secondary | ICD-10-CM | POA: Insufficient documentation

## 2024-04-21 DIAGNOSIS — M8580 Other specified disorders of bone density and structure, unspecified site: Secondary | ICD-10-CM | POA: Diagnosis not present

## 2024-04-21 LAB — CMP (CANCER CENTER ONLY)
ALT: 12 U/L (ref 0–44)
AST: 16 U/L (ref 15–41)
Albumin: 4.1 g/dL (ref 3.5–5.0)
Alkaline Phosphatase: 74 U/L (ref 38–126)
Anion gap: 7 (ref 5–15)
BUN: 15 mg/dL (ref 8–23)
CO2: 27 mmol/L (ref 22–32)
Calcium: 9.6 mg/dL (ref 8.9–10.3)
Chloride: 105 mmol/L (ref 98–111)
Creatinine: 0.96 mg/dL (ref 0.44–1.00)
GFR, Estimated: >60 mL/min (ref >60)
Glucose, Bld: 115 mg/dL — ABNORMAL HIGH (ref 70–99)
Potassium: 4.1 mmol/L (ref 3.5–5.1)
Sodium: 139 mmol/L (ref 135–145)
Total Bilirubin: 0.4 mg/dL (ref 0.0–1.2)
Total Protein: 7.3 g/dL (ref 6.5–8.1)

## 2024-04-21 LAB — CBC WITH DIFFERENTIAL (CANCER CENTER ONLY)
Abs Immature Granulocytes: 0.02 K/uL (ref 0.00–0.07)
Basophils Absolute: 0.1 K/uL (ref 0.0–0.1)
Basophils Relative: 1 %
Eosinophils Absolute: 0.4 K/uL (ref 0.0–0.5)
Eosinophils Relative: 4 %
HCT: 40.4 % (ref 36.0–46.0)
Hemoglobin: 13.3 g/dL (ref 12.0–15.0)
Immature Granulocytes: 0 %
Lymphocytes Relative: 30 %
Lymphs Abs: 2.9 K/uL (ref 0.7–4.0)
MCH: 29.8 pg (ref 26.0–34.0)
MCHC: 32.9 g/dL (ref 30.0–36.0)
MCV: 90.6 fL (ref 80.0–100.0)
Monocytes Absolute: 1.1 K/uL — ABNORMAL HIGH (ref 0.1–1.0)
Monocytes Relative: 12 %
Neutro Abs: 5 K/uL (ref 1.7–7.7)
Neutrophils Relative %: 53 %
Platelet Count: 329 K/uL (ref 150–400)
RBC: 4.46 MIL/uL (ref 3.87–5.11)
RDW: 13.1 % (ref 11.5–15.5)
WBC Count: 9.5 K/uL (ref 4.0–10.5)
nRBC: 0 % (ref 0.0–0.2)

## 2024-04-21 LAB — IRON AND IRON BINDING CAPACITY (CC-WL,HP ONLY)
Iron: 92 ug/dL (ref 28–170)
Saturation Ratios: 21 % (ref 10.4–31.8)
TIBC: 444 ug/dL (ref 250–450)
UIBC: 352 ug/dL (ref 148–442)

## 2024-04-22 LAB — FERRITIN: Ferritin: 19 ng/mL (ref 11–307)

## 2024-04-22 NOTE — Assessment & Plan Note (Addendum)
-  H/o right breast ADH s/p lumpectomy in 02/2020 and 09/2020. -She tried prophylactic AI (anastrozole 10/2020, switched to exemestane  05/2021) but did not tolerate well and stopped 06/2022 due to SE's. She tried Raloxifene  1/23 - 6/23 but stopped due to possible TIA.  -She continues high risk screening program, MRI 06/26/2022 showed stable likely benign left breast mass, stable postsurgical changes in the right breast, no evidence of malignancy in either breast.  A follow-up left breast ultrasound 09/24/2022 showed stability consistent with an area of fat necrosis.   06/24/2023 -bilateral breast MRI showed no evidence of malignancy. 01/07/2024 -3D diagnostic mammogram showed no mammographic or sonographic abnormality in either breast.   01/27/2024 -bone density test showed osteopenia/low bone density.  FRAX 10-year fracture risk is 9.3% for major fracture and 1.3% percent for hip fracture.   Next Breast MRI should be 06/2024 and diagnostic mammogram in 01/2025. Repeat bone density in 01/2026.  Continue cancer surveillance and annual follow up

## 2024-04-22 NOTE — Progress Notes (Signed)
 Patient Care Team: Rollene Almarie LABOR, MD as PCP - General (Internal Medicine) Jeffrie Oneil BROCKS, MD as PCP - Cardiology (Cardiology) Inocencio Soyla Lunger, MD as PCP - Electrophysiology (Cardiology) Lanny Callander, MD as Consulting Physician (Hematology) Vanderbilt Ned, MD as Consulting Physician (General Surgery) [facility] as Consulting Physician (Radiology) Doristine Burkitt Ophthalmology (Ophthalmology)  Clinic Day:  04/23/2024  Referring physician: Rollene Almarie LABOR, *  ASSESSMENT & PLAN:   Assessment & Plan: Atypical ductal hyperplasia of right breast -H/o right breast ADH s/p lumpectomy in 02/2020 and 09/2020. -She tried prophylactic AI (anastrozole 10/2020, switched to exemestane  05/2021) but did not tolerate well and stopped 06/2022 due to SE's. She tried Raloxifene  1/23 - 6/23 but stopped due to possible TIA.  -She continues high risk screening program, MRI 06/26/2022 showed stable likely benign left breast mass, stable postsurgical changes in the right breast, no evidence of malignancy in either breast.  A follow-up left breast ultrasound 09/24/2022 showed stability consistent with an area of fat necrosis.   06/24/2023 -bilateral breast MRI showed no evidence of malignancy. 01/07/2024 -3D diagnostic mammogram showed no mammographic or sonographic abnormality in either breast.   01/27/2024 -bone density test showed osteopenia/low bone density.  FRAX 10-year fracture risk is 9.3% for major fracture and 1.3% percent for hip fracture.   Next Breast MRI should be 06/2024 and diagnostic mammogram in 01/2025. Repeat bone density in 01/2026.  Continue cancer surveillance and annual follow up    Plan Labs reviewed. - CBC and CMP unremarkable. - Ferritin and iron studies are within normal limits. Reviewed results of 3D diagnostic mammogram from 01/07/2024 which were negative. Reviewed DEXA scan results from 01/27/2024 which showed osteopenia/low bone density.  Encouraged continued  daily intake of calcium  and vitamin D3.  Recommended she continue to remain physically active, especially in activities with low impact. Continue breast cancer surveillance. Bilateral screening MRI ordered for September 2025 as part of today's visit. Diagnostic mammogram should be scheduled in early April 2026. Labs with follow-up in 1 year, sooner if needed.   The patient understands the plans discussed today and is in agreement with them.  She knows to contact our office if she develops concerns prior to her next appointment.  I provided 25 minutes of face-to-face time during this encounter and > 50% was spent counseling as documented under my assessment and plan.    Powell FORBES Lessen, NP  [facility] - A DEPT [facility] [address] [place] [address] Dept: [phone] Dept Fax: [phone]   Orders Placed This Encounter  Procedures   MR BREAST BILATERAL W WO CONTRAST INC CAD    Standing Status:   Future    Expected Date:   06/24/2024    Expiration Date:   04/23/2025    If indicated for the ordered procedure, I authorize the administration of contrast media per Radiology protocol:   Yes    What is the patient's sedation requirement?:   No Sedation    Does the patient have a pacemaker or implanted devices?:   No    Radiology Contrast Protocol - do NOT remove file path:   [email or web address]    Preferred imaging location?:   [facility] (table limit-550lbs)      CHIEF COMPLAINT:  CC: Atypical ductal hyperplasia of right breast  Current Treatment: Surveillance and high risk screening program (staggered mammogram/MRI 6 months apart)  INTERVAL HISTORY:  Desiree Snow  is here today for repeat clinical assessment.  She was last seen by Lacie, NP on 04/25/2023.  She had lab work done on 04/21/2024.  Results within normal limits.  Ferritin normal/normal.  3D diagnostic  mammogram done 01/07/2024.  There was no mammographic or sonographic abnormality in either breast.  She had a bone density test on 01/27/2024.  She does have osteopenia/low bone density.  FRAX 10-year fracture risk is 9.3% for major fracture and 1.3% percent for hip fracture.  Bilateral breast MRI was done 06/24/2023 showing no evidence of malignancy.  Bilateral breast MRI ordered for September 2024 as part of today's visit.  She reports no new changes or lumps in other breast.  She denies chest pain, chest pressure, or shortness of breath. She denies headaches or visual disturbances. She denies abdominal pain, nausea, vomiting, or changes in bowel or bladder habits.   She denies fevers or chills. She denies pain. Her appetite is good. Her weight has been stable.  I have reviewed the past medical history, past surgical history, social history and family history with the patient and they are unchanged from previous note.  ALLERGIES:  is allergic to cetirizine , kenalog [triamcinolone ], metronidazole , quinolones, scopolamine, and penicillins.  MEDICATIONS:  Current Outpatient Medications  Medication Sig Dispense Refill   apixaban  (ELIQUIS ) 5 MG TABS tablet Take 1 tablet (5 mg total) by mouth 2 (two) times daily. 180 tablet 3   B Complex Vitamins (B COMPLEX 1 PO)      Calcium  Carbonate-Vit D-Min (CALCIUM  1200 PO) Take 1 tablet by mouth daily.     Cholecalciferol  (VITAMIN D3) 1000 units CAPS Take 1,000 Units by mouth daily.     CINNAMON  PO Take 100 mg by mouth at bedtime.     Coenzyme Q10 (CO Q-10) 100 MG CAPS Take 100 mg by mouth at bedtime.     Collagen-Vitamin C-Biotin (COLLAGEN PO) Biocell collagen-take 1 tablet by mouth daily Does not have Vitamin C in it     diltiazem  (CARDIZEM  CD) 120 MG 24 hr capsule TAKE 1 CAPSULE BY MOUTH EVERY DAY 30 capsule 7   famotidine  (PEPCID ) 20 MG tablet One after supper 30 tablet 11   flecainide  (TAMBOCOR ) 100 MG tablet Take 1 tablet (100 mg total) by mouth 2 (two) times  daily. 180 tablet 1   lamoTRIgine  (LAMICTAL ) 100 MG tablet Take 1 tablet (100 mg total) by mouth 2 (two) times daily. 180 tablet 4   Magnesium  250 MG TABS Take 250 mg by mouth daily.     Multiple Vitamin (MULTIVITAMIN) tablet Take 1 tablet by mouth daily.     pantoprazole  (PROTONIX ) 40 MG tablet Take 1 tablet (40 mg total) by mouth daily. Take 30-60 min before first meal of the day 30 tablet 2   TURMERIC PO Take 1 tablet by mouth daily.     No current facility-administered medications for this visit.     REVIEW OF SYSTEMS:   Constitutional: Denies fevers, chills or abnormal weight loss Eyes: Denies blurriness of vision Ears, nose, mouth, throat, and face: Denies mucositis or sore throat Respiratory: Denies cough, dyspnea or wheezes Cardiovascular: Denies palpitation, chest discomfort or lower extremity swelling Gastrointestinal:  Denies nausea, heartburn or change in bowel habits Skin: Denies abnormal skin rashes Lymphatics: Denies new lymphadenopathy or easy bruising Neurological:Denies numbness, tingling or new weaknesses Behavioral/Psych: Mood is stable, no new changes  All other systems were reviewed with the patient and are negative.   VITALS:   Today's Vitals   04/23/24 1120  04/23/24 1133  BP: 138/64   Pulse: 73   Resp: 17   Temp: 97.8 F (36.6 C)   SpO2: 96%   Weight: 198 lb 11.2 oz (90.1 kg)   PainSc:  0-No pain   Body mass index is 32.07 kg/m.   Wt Readings from Last 3 Encounters:  04/23/24 198 lb 11.2 oz (90.1 kg)  03/30/24 196 lb 9.6 oz (89.2 kg)  03/17/24 198 lb (89.8 kg)    Body mass index is 32.07 kg/m.  Performance status (ECOG): 1 - Symptomatic but completely ambulatory  PHYSICAL EXAM:   GENERAL:alert, no distress and comfortable SKIN: skin color, texture, turgor are normal, no rashes or significant lesions EYES: normal, Conjunctiva are pink and non-injected, sclera clear OROPHARYNX:no exudate, no erythema and lips, buccal mucosa, and tongue  normal  NECK: supple, thyroid  normal size, non-tender, without nodularity LYMPH:  no palpable lymphadenopathy in the cervical, axillary or inguinal LUNGS: clear to auscultation and percussion with normal breathing effort HEART: regular rate & rhythm and no murmurs and no lower extremity edema ABDOMEN:abdomen soft, non-tender and normal bowel sounds Musculoskeletal:no cyanosis of digits and no clubbing  NEURO: alert & oriented x 3 with fluent speech, no focal motor/sensory deficits BREAST: Well-healed lumpectomy scars in the right breast.  There are no new palpable lumps or masses.  There is no nipple inversion or discharge.  There is no axillary lymphadenopathy on the left.  The right breast contains well-healed lumpectomy scars.  There are no palpable lumps or masses noted today.  There is no nipple inversion or nipple discharge.  There is no axillary lymphadenopathy on the right.  LABORATORY DATA:  I have reviewed the data as listed    Component Value Date/Time   NA 139 04/21/2024 1503   NA 139 07/08/2023 1054   K 4.1 04/21/2024 1503   CL 105 04/21/2024 1503   CO2 27 04/21/2024 1503   GLUCOSE 115 (H) 04/21/2024 1503   BUN 15 04/21/2024 1503   BUN 14 07/08/2023 1054   CREATININE 0.96 04/21/2024 1503   CALCIUM  9.6 04/21/2024 1503   PROT 7.3 04/21/2024 1503   ALBUMIN 4.1 04/21/2024 1503   AST 16 04/21/2024 1503   ALT 12 04/21/2024 1503   ALKPHOS 74 04/21/2024 1503   BILITOT 0.4 04/21/2024 1503   GFRNONAA >60 04/21/2024 1503   GFRAA >60 02/26/2020 1200    Lab Results  Component Value Date   WBC 9.5 04/21/2024   NEUTROABS 5.0 04/21/2024   HGB 13.3 04/21/2024   HCT 40.4 04/21/2024   MCV 90.6 04/21/2024   PLT 329 04/21/2024

## 2024-04-23 ENCOUNTER — Inpatient Hospital Stay: Payer: Medicare Other | Admitting: Nurse Practitioner

## 2024-04-23 VITALS — BP 138/64 | HR 73 | Temp 97.8°F | Resp 17 | Wt 198.7 lb

## 2024-04-23 DIAGNOSIS — M8580 Other specified disorders of bone density and structure, unspecified site: Secondary | ICD-10-CM | POA: Diagnosis not present

## 2024-04-23 DIAGNOSIS — N6091 Unspecified benign mammary dysplasia of right breast: Secondary | ICD-10-CM

## 2024-04-23 DIAGNOSIS — E611 Iron deficiency: Secondary | ICD-10-CM | POA: Diagnosis not present

## 2024-04-26 ENCOUNTER — Encounter: Payer: Self-pay | Admitting: Nurse Practitioner

## 2024-04-26 ENCOUNTER — Encounter (HOSPITAL_BASED_OUTPATIENT_CLINIC_OR_DEPARTMENT_OTHER): Payer: Self-pay | Admitting: Physical Therapy

## 2024-04-27 ENCOUNTER — Other Ambulatory Visit: Payer: Self-pay | Admitting: Family Medicine

## 2024-04-29 ENCOUNTER — Encounter: Payer: Self-pay | Admitting: Nurse Practitioner

## 2024-05-12 ENCOUNTER — Ambulatory Visit (HOSPITAL_BASED_OUTPATIENT_CLINIC_OR_DEPARTMENT_OTHER): Attending: Family Medicine | Admitting: Physical Therapy

## 2024-05-12 ENCOUNTER — Encounter (HOSPITAL_BASED_OUTPATIENT_CLINIC_OR_DEPARTMENT_OTHER): Payer: Self-pay | Admitting: Physical Therapy

## 2024-05-12 DIAGNOSIS — M6281 Muscle weakness (generalized): Secondary | ICD-10-CM | POA: Insufficient documentation

## 2024-05-12 DIAGNOSIS — M79651 Pain in right thigh: Secondary | ICD-10-CM | POA: Diagnosis not present

## 2024-05-12 NOTE — Therapy (Signed)
 OUTPATIENT PHYSICAL THERAPY LOWER EXTREMITY TREATMENT   Patient Name: Desiree Snow MRN: [id number] DOB:04-06-1951, 73 y.o., female Today's Date: 05/12/2024  END OF SESSION:  PT End of Session - 05/12/24 1354     Visit Number 6    Number of Visits 12    Date for PT Re-Evaluation 06/01/24    Authorization Type [employer]    Authorization Time Period 6/2-8/25    PT Start Time 1316    PT Stop Time 1355    PT Time Calculation (min) 39 min    Activity Tolerance Patient tolerated treatment well    Behavior During Therapy WFL for tasks assessed/performed             Past Medical History:  Diagnosis Date   Allergy     Anxiety    Arthritis    Blood transfusion without reported diagnosis    Cataract    Chicken pox    Colon polyps    Depression 1998   Postop effect after Amygdalohippocampetomy   Diverticulosis    Family history of uterine cancer 05/05/2021   GERD (gastroesophageal reflux disease)    H/O febrile seizure    as infant, x 1, List of AEDs tried: Dilantin, Mysoline, Tegretol, Gabapentine, Depakote,  Keppra, and Lamictal ,   Seizures (HCC)    resolved last seizure in 2002   Thyroid  goiter    Ulcer    small gastric ulcers found during endoscopy   Past Surgical History:  Procedure Laterality Date   BRAIN SURGERY  1998   Right Amygdalohippocampectomy   BREAST BIOPSY Right 07/29/2020   COMPLEX SCLEROSING LESION WITH ATYPICAL DUCTAL   BREAST BIOPSY Right 01/01/2020   COMPLEX SCLEROSING LESION    BREAST EXCISIONAL BIOPSY Right 09/20/2020   complex sclerosing   BREAST EXCISIONAL BIOPSY Right 03/01/2020   Favor IDC, CSL also considered   BREAST EXCISIONAL BIOPSY Left    ? Date  ?lipoma   BREAST LUMPECTOMY WITH RADIOACTIVE SEED LOCALIZATION Right 03/01/2020   Procedure: RIGHT BREAST LUMPECTOMY WITH RADIOACTIVE SEED LOCALIZATION;  Surgeon: Vanderbilt Ned, MD;  Location: [facility];  Service: General;  Laterality: Right;   BREAST LUMPECTOMY WITH  RADIOACTIVE SEED LOCALIZATION Right 09/20/2020   Procedure: RIGHT BREAST LUMPECTOMY WITH RADIOACTIVE SEED LOCALIZATION;  Surgeon: Vanderbilt Ned, MD;  Location: [facility];  Service: General;  Laterality: Right;   COSMETIC SURGERY  2012   Lifestyle Lift   EYE SURGERY     right cataract removal 09/18/23   EYE SURGERY Left 01/14/2024   cataract removal   JOINT REPLACEMENT  08/2021   Left total knee replacement   KNEE SURGERY  2021   LOBECTOMY FOR SEIZURE FOCUS  1998   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   MENISECTOMY Left    SPLENECTOMY  1961   TONSILLECTOMY AND ADENOIDECTOMY  1963   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   Procedure: TOTAL KNEE ARTHROPLASTY;  Surgeon: Kay Kemps, MD;  Location: [facility];  Service: Orthopedics;  Laterality: Left;   TRIGGER FINGER RELEASE Right    middle   WISDOM TOOTH EXTRACTION     Patient Active Problem List   Diagnosis Date Noted   Right thigh pain 01/07/2024   Routine general medical examination at a health care facility 01/07/2024   Right elbow pain 10/25/2023   Encounter for monitoring flecainide  therapy 09/10/2023   Paroxysmal atrial fibrillation (HCC) 09/10/2023   H/O total knee replacement, left 08/25/2021   Genetic testing 05/26/2021   Atypical ductal hyperplasia  of right breast 12/14/2020   Chronic rhinitis/ pnds > lifelong daytime cough 02/04/2019   DOE (dyspnea on exertion) 02/04/2019   Multinodular goiter 06/20/2017   Incidental pulmonary nodule 05/15/2017   Seizure disorder (HCC) 05/15/2017   GERD (gastroesophageal reflux disease) 04/12/2017   IFG (impaired fasting glucose) 04/12/2017    PCP: Cleatrice Ludie SAUNDERS, MD   REFERRING PROVIDER: Rollene Almarie LABOR, MD   REFERRING DIAG:  Diagnosis  M25.551 (ICD-10-CM) - Pain of right hip    THERAPY DIAG:  Pain in right thigh  Muscle weakness (generalized)  Rationale for Evaluation and Treatment: Rehabilitation  ONSET DATE:  ~2019  SUBJECTIVE:   SUBJECTIVE STATEMENT:  Pt reports the thigh pain is better. She reports the pain is better but has a band of medial thigh with ER that is sharp. Pt notes that it is about 50% better. Getting in out of the car is better.   Eval:   Started 2019 at [facility] with excessive stretching movement in figure 4. Pt reports it has progressively worsened. Pt does not radiate to other areas and is focused to groin region. Pt is adamant in that this is not a R hip issue or a problem with muscular origin and insertions. Pt reports that it is very painful with R LE adduction and palpation to the medial thigh/groin. Pt has history of L LE lift in shoe. Denies NT. Pt notes footwear is hard to find due to narrow foot and causing R foot to move around. Denies cancer red flags. Pt will sometimes will have pain at night and will need tylenol  to sleep. Pt [employer] 1x/week for silver sneakers. 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     PERTINENT HISTORY: Anxiety, depression, seizures, DOE, L TKA, R ankle sprain  PAIN:  Are you having pain? Yes: NPRS scale: 1/10 at rest; 5/10 at worst  Pain location: R adductor Pain description: sharp Aggravating factors: squatting, cars, transfers, hip rotations Relieving factors: nothing  PRECAUTIONS: Other: seizures  RED FLAGS: None   WEIGHT BEARING RESTRICTIONS: No  FALLS:  Has patient fallen in last 6 months? Yes. Number of falls 1 January, tripped over a mat. 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  DIAGNOSTIC FINDINGS:   IMPRESSION: 1. 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 2. Diffuse degenerative irregularity  and tears of the anterior superior and superior right acetabular labrum. 3. Very mild right greater than left obturator externus origin muscle strains. No fluid bright tear. 4. Mild left common hamstring origin tendinosis. 5. Minimal left gluteus minimus insertional tendinosis. 6. Moderate pubic symphysis osteoarthritis. 7. Imaged only on a single large field-of-view coronal STIR sequence, there are two lesions within the left femoral diaphysis that are nonspecific but grossly compatible with remote infarcts. No acute fracture or cortical break is seen.     Electronically Signed   By: Tanda Lyons M.D.   On: 01/27/2024 08:57  PATIENT SURVEYS:   Lower Extremity Functional Score: 33 / 80 = 41.3 % COGNITION: Overall cognitive status: Within functional limits for tasks assessed                         PALPATION: TTP of R proximal adductor group; mild hypertonicity;  LOWER EXTREMITY MMT:   MMT (HHD in lbs)  L eval R eval  Hip flexion 37.3 30.4 p!  Hip extension    Hip abduction 32.9 27.9  Hip adduction 21.3 15.3 p!  Hip internal rotation    Hip external rotation    Knee flexion    Knee extension 28.6 24.6  Ankle dorsiflexion      Ankle plantarflexion      Ankle inversion      Ankle eversion       (Blank rows = not tested)   LOWER EXTREMITY ROM: moderately limited in all planes but especially painful with active ADD and passive ABD  Specials Tests: positive FABER bilaterally Positive leg roll on R    GAIT: 71ft Assistive device utilized:N/A Level of assistance: Independence Comments: Mild toe out, decreased step length bilaterally     Transfers: painful adduction with supine to sit, unable to actively adductor without pain once in butterfly position                                                                                                                                TREATMENT DATE:    8/5  Inf  mob grade III R hip STM R adductor magnus distal  portion and medial HS  Bridge with ADD 2x10 3s Standing hip flexor stretch 30s 3x (unable to do leg swing) Standing lunge alternating 2s hold 2x10 Seated figured 4 slide 2x10 5s holds   HEP updates to frequency, focusing on LLLD stretching vs intense bursts  7/7 Mulligan lateral mob grade III R hip  Shuttle leg press (frog position) 3x8 100lbs RTB clam 2x10  Side to side lunge 2x8 (small range) Adductor machine 2x10 3s holds    6/24  Nustep warm up lvl 5 5 min  Squat at rail 3x8 Fwd T at rail 3x8 Supine HS stretch 10s 10x Self STM with foam roller to adductor/groin Supine groin stretch 30s 3x  6/17  R hip ER 30 deg, R adductor strength 4-/5 with pain  STM R adductor magnus distal portion and medial HS  Exercises - Supine Hamstring Stretch  - 2 x daily - 7 x weekly - 1 sets - 3 reps - 30 hold - Supine Butterfly Groin Stretch  - 2 x daily - 7 x weekly - 1 sets - 3 reps - 30 hold - Deep Squat  - 1 x daily - 7 x weekly - 3 sets - 6 reps - Staggered Bridge  - 1 x daily - 7 x weekly - 3 sets - 3 reps - Forward T with Counter Support  - 1 x daily - 7 x weekly - 3 sets - 8 reps - Alternating Lateral Lunges  - 1 x daily - 7 x weekly - 3 sets - 10 reps  6/10  STM R adductor magnus distal portion  Groin stretch 30s 3x supine Standing adductor stretch 30s 3x Deep squat at  rail 3x6 Step flexion groin stretch 10s 10x Leg swing 2x20 Wall RDL with foam roller on thighs 2x10  6/2  Exercises - Supine Hip Adduction Isometric with Ball  - 2 x daily - 7 x weekly - 2 sets - 10 reps - Supine Bridge with Mini Swiss Ball Between Knees  - 2 x daily - 7 x weekly - 2 sets - 10 reps - Supine Butterfly Groin Stretch  - 2 x daily - 7 x weekly - 1 sets - 3 reps - 30 hold   Thermotherapy, self pain management, activity tolerance and progressive exercise   PATIENT EDUCATION:  Education details:  acceptable levels of pain, anatomy, exercise progression, DOMS expectations, muscle firing,   envelope of function, HEP, POC  Person educated: Patient Education method: Explanation, Demonstration, Tactile cues, and Verbal cues Education comprehension: verbalized understanding, returned demonstration, verbal cues required, tactile cues required, and needs further education     HOME EXERCISE PROGRAM:   Access Code: [email or web address] URL: [email or web address] Date: 03/09/2024 Prepared by: Dale Call      ASSESSMENT:   CLINICAL IMPRESSION: Pt with 50% improvement in thigh/groin pain since starting therapy. Funcitonal transfers are improved but pt still with pain in tailors position and A/PROM adduction. Pt does have lingering soft tissue length resistriction and advised on increasing duration of static stretch to improve tissue length vs her ballistic stretching she had been performing. HEP progressed for isometrics and greater hip extension to aid in proximal adductor length restricition. Consider manual PRN and addition of Mulligan mob PRN. Pt is making steady progress but is slower than anticipated. HEP freq reduced in attempts improve recovery. Pt would benefit from continued skilled therapy in order to reach goals and maximize functional R LE strength and ROM for full return to normalized community mobility, exercise, and ADL.     OBJECTIVE IMPAIRMENTS: decreased activity tolerance, decreased endurance, decreased mobility, difficulty walking, decreased strength, and pain.    ACTIVITY LIMITATIONS: standing, transfers, and locomotion level   PARTICIPATION LIMITATIONS: cleaning, shopping, and community activity   PERSONAL FACTORS: Age, Fitness, 2+ comorbidities, and Time since onset of injury/illness/exacerbation are also affecting patient's functional outcome.    REHAB POTENTIAL: Good   CLINICAL DECISION MAKING: stable/uncomplicated   EVALUATION COMPLEXITY: Low     GOALS:     SHORT TERM GOALS: Target date: 04/20/2024      Pt will be independent and compliant  with HEP for improved pain, strength, and function.  Baseline: Goal status: ongoing     2.  Pt will be able to demonstrate ability to perform supine <> sit transfers without pain in order to demonstrate functional improvement in LE function for self-care and house hold duties.    Baseline:  Goal status: ongoing   3. Pt will report at least 2 pt reduction on NPRS scale for pain in order to demonstrate functional improvement with household activity, self care, and ADL.      Baseline:  Goal status: MET     LONG TERM GOALS: Target date: 06/01/2024      Pt will be independent and compliant with HEP for improved pain, strength, and function.  Baseline: Goal status: INITIAL   2.  Pt will be able to demonstrate at least 10 lb strength improvement in R adduction in order to demonstrate functional improvement in LE function for self-care and community mobility    Baseline:  Goal status: INITIAL   3.  Pt will be able to demonstrate DL squat/ STS to beyond  parallel depth without pain in order to demonstrate functional improvement in R LE strength for return to PLOF and exercise.    Baseline:  Goal status: INITIAL   4. Pt will report ability to getting in and out of car and bed without pain  in order to demonstrate functional improvement with household activity, self care, and ADL.     Baseline:  Goal status: INITIAL   PLAN:   PT FREQUENCY: 1-2x/week   PT DURATION: 12 wks   PLANNED INTERVENTIONS: 97164- PT Re-evaluation, 97110-Therapeutic exercises, 97530- Therapeutic activity, [id number]- Neuromuscular re-education, 97535- Self Care, [id number]- Manual therapy, [id number]- Gait training, [phone]- Aquatic Therapy, 97014- Electrical stimulation (unattended), [id number]- Electrical stimulation (manual), [id number]- Ultrasound, Patient/Family education, Balance training, Stair training, Taping, Dry Needling, Joint mobilization, Spinal mobilization, Cryotherapy, and Moist heat   PLAN FOR NEXT SESSION: R  adductor  ROM/mobility and strength         Dale Call, PT 05/12/2024, 1:59 PM

## 2024-05-15 ENCOUNTER — Other Ambulatory Visit: Payer: Self-pay | Admitting: Cardiology

## 2024-05-25 ENCOUNTER — Encounter (HOSPITAL_BASED_OUTPATIENT_CLINIC_OR_DEPARTMENT_OTHER): Payer: Self-pay | Admitting: Physical Therapy

## 2024-05-25 ENCOUNTER — Ambulatory Visit (HOSPITAL_BASED_OUTPATIENT_CLINIC_OR_DEPARTMENT_OTHER): Admitting: Physical Therapy

## 2024-05-25 DIAGNOSIS — M79651 Pain in right thigh: Secondary | ICD-10-CM

## 2024-05-25 DIAGNOSIS — M6281 Muscle weakness (generalized): Secondary | ICD-10-CM | POA: Diagnosis not present

## 2024-05-25 NOTE — Therapy (Signed)
 OUTPATIENT PHYSICAL THERAPY LOWER EXTREMITY TREATMENT   Patient Name: Desiree Snow MRN: [id number] DOB:1951-08-28, 73 y.o., female Today's Date: 05/25/2024  END OF SESSION:  PT End of Session - 05/25/24 1546     Visit Number 7    Number of Visits 12    Date for PT Re-Evaluation 06/01/24    Authorization Type [employer]    Authorization Time Period 6/2-8/25    PT Start Time 1533    PT Stop Time 1612    PT Time Calculation (min) 39 min    Activity Tolerance Patient tolerated treatment well    Behavior During Therapy [facility] for tasks assessed/performed              Past Medical History:  Diagnosis Date   Allergy     Anxiety    Arthritis    Blood transfusion without reported diagnosis    Cataract    Chicken pox    Colon polyps    Depression 1998   Postop effect after Amygdalohippocampetomy   Diverticulosis    Family history of uterine cancer 05/05/2021   GERD (gastroesophageal reflux disease)    H/O febrile seizure    as infant, x 1, List of AEDs tried: Dilantin, Mysoline, Tegretol, Gabapentine, Depakote,  Keppra, and Lamictal ,   Seizures (HCC)    resolved last seizure in 2002   Thyroid  goiter    Ulcer    small gastric ulcers found during endoscopy   Past Surgical History:  Procedure Laterality Date   BRAIN SURGERY  1998   Right Amygdalohippocampectomy   BREAST BIOPSY Right 07/29/2020   COMPLEX SCLEROSING LESION WITH ATYPICAL DUCTAL   BREAST BIOPSY Right 01/01/2020   COMPLEX SCLEROSING LESION    BREAST EXCISIONAL BIOPSY Right 09/20/2020   complex sclerosing   BREAST EXCISIONAL BIOPSY Right 03/01/2020   Favor IDC, CSL also considered   BREAST EXCISIONAL BIOPSY Left    ? Date  ?lipoma   BREAST LUMPECTOMY WITH RADIOACTIVE SEED LOCALIZATION Right 03/01/2020   Procedure: RIGHT BREAST LUMPECTOMY WITH RADIOACTIVE SEED LOCALIZATION;  Surgeon: Vanderbilt Ned, MD;  Location: [facility];  Service: General;  Laterality: Right;   BREAST LUMPECTOMY WITH  RADIOACTIVE SEED LOCALIZATION Right 09/20/2020   Procedure: RIGHT BREAST LUMPECTOMY WITH RADIOACTIVE SEED LOCALIZATION;  Surgeon: Vanderbilt Ned, MD;  Location: [facility];  Service: General;  Laterality: Right;   COSMETIC SURGERY  2012   Lifestyle Lift   EYE SURGERY     right cataract removal 09/18/23   EYE SURGERY Left 01/14/2024   cataract removal   JOINT REPLACEMENT  08/2021   Left total knee replacement   KNEE SURGERY  2021   LOBECTOMY FOR SEIZURE FOCUS  1998   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   MENISECTOMY Left    SPLENECTOMY  1961   TONSILLECTOMY AND ADENOIDECTOMY  1963   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   Procedure: TOTAL KNEE ARTHROPLASTY;  Surgeon: Kay Kemps, MD;  Location: [facility];  Service: Orthopedics;  Laterality: Left;   TRIGGER FINGER RELEASE Right    middle   WISDOM TOOTH EXTRACTION     Patient Active Problem List   Diagnosis Date Noted   Right thigh pain 01/07/2024   Routine general medical examination at a health care facility 01/07/2024   Right elbow pain 10/25/2023   Encounter for monitoring flecainide  therapy 09/10/2023   Paroxysmal atrial fibrillation (HCC) 09/10/2023   H/O total knee replacement, left 08/25/2021   Genetic testing 05/26/2021   Atypical ductal  hyperplasia of right breast 12/14/2020   Chronic rhinitis/ pnds > lifelong daytime cough 02/04/2019   DOE (dyspnea on exertion) 02/04/2019   Multinodular goiter 06/20/2017   Incidental pulmonary nodule 05/15/2017   Seizure disorder (HCC) 05/15/2017   GERD (gastroesophageal reflux disease) 04/12/2017   IFG (impaired fasting glucose) 04/12/2017    PCP: Cleatrice Ludie SAUNDERS, MD   REFERRING PROVIDER: Rollene Almarie LABOR, MD   REFERRING DIAG:  Diagnosis  M25.551 (ICD-10-CM) - Pain of right hip    THERAPY DIAG:  Pain in right thigh  Muscle weakness (generalized)  Rationale for Evaluation and Treatment: Rehabilitation  ONSET DATE:  ~2019  SUBJECTIVE:   SUBJECTIVE STATEMENT:  Pt reports she has been doing leg swings and was painful. She is able to get in and out of the car with less pain. However, she has been doing HEP daily and the leg kick exercise from last session.  Eval:   Started 2019 at [facility] with excessive stretching movement in figure 4. Pt reports it has progressively worsened. Pt does not radiate to other areas and is focused to groin region. Pt is adamant in that this is not a R hip issue or a problem with muscular origin and insertions. Pt reports that it is very painful with R LE adduction and palpation to the medial thigh/groin. Pt has history of L LE lift in shoe. Denies NT. Pt notes footwear is hard to find due to narrow foot and causing R foot to move around. Denies cancer red flags. Pt will sometimes will have pain at night and will need tylenol  to sleep. Pt [employer] 1x/week for silver sneakers. 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     PERTINENT HISTORY: Anxiety, depression, seizures, DOE, L TKA, R ankle sprain  PAIN:  Are you having pain? Yes: NPRS scale: 1/10 at rest; 5/10 at worst  Pain location: R adductor Pain description: sharp Aggravating factors: squatting, cars, transfers, hip rotations Relieving factors: nothing  PRECAUTIONS: Other: seizures  RED FLAGS: None   WEIGHT BEARING RESTRICTIONS: No  FALLS:  Has patient fallen in last 6 months? Yes. Number of falls 1 January, tripped over a mat. 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  DIAGNOSTIC FINDINGS:   IMPRESSION: 1. 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 2. Diffuse degenerative irregularity  and tears of the anterior superior and superior right acetabular labrum. 3. Very mild right greater than left obturator externus origin muscle strains. No fluid bright tear. 4. Mild left common hamstring origin tendinosis. 5. Minimal left gluteus minimus insertional tendinosis. 6. Moderate pubic symphysis osteoarthritis. 7. Imaged only on a single large field-of-view coronal STIR sequence, there are two lesions within the left femoral diaphysis that are nonspecific but grossly compatible with remote infarcts. No acute fracture or cortical break is seen.     Electronically Signed   By: Tanda Lyons M.D.   On: 01/27/2024 08:57  PATIENT SURVEYS:   Lower Extremity Functional Score: 33 / 80 = 41.3 %    8/18 Lower Extremity Functional Score: 57 / 80 = 71.3 %   COGNITION: Overall cognitive status: Within functional limits for tasks assessed  PALPATION: TTP of R proximal adductor group; mild hypertonicity;   LOWER EXTREMITY MMT:   MMT (HHD in lbs)  L eval R eval  Hip flexion 37.3 30.4 p!  Hip extension    Hip abduction 32.9 27.9  Hip adduction 21.3 15.3 p!  Hip internal rotation    Hip external rotation    Knee flexion    Knee extension 28.6 24.6  Ankle dorsiflexion      Ankle plantarflexion      Ankle inversion      Ankle eversion       (Blank rows = not tested)   LOWER EXTREMITY ROM: moderately limited in all planes but especially painful with active ADD and passive ABD  Specials Tests: positive FABER bilaterally Positive leg roll on R    GAIT: 25ft Assistive device utilized:N/A Level of assistance: Independence Comments: Mild toe out, decreased step length bilaterally     Transfers: painful adduction with supine to sit, unable to actively adductor without pain once in butterfly position                                                                                                                                TREATMENT DATE:   .  8/18   Inf  mob grade III R hip STM R adductor magnus distal portion and medial HS  Supine hip IR/ER 2x10 S/L adduction 2x10 Figure 4 slide 10x  HEP review  8/5   Inf  mob grade III R hip STM R adductor magnus distal portion and medial HS  Bridge with ADD 2x10 3s Standing hip flexor stretch 30s 3x (unable to do leg swing) Standing lunge alternating 2s hold 2x10 Seated figured 4 slide 2x10 5s holds   HEP updates to frequency, focusing on LLLD stretching vs intense bursts  7/7 Mulligan lateral mob grade III R hip  Shuttle leg press (frog position) 3x8 100lbs RTB clam 2x10  Side to side lunge 2x8 (small range) Adductor machine 2x10 3s holds    6/24  Nustep warm up lvl 5 5 min  Squat at rail 3x8 Fwd T at rail 3x8 Supine HS stretch 10s 10x Self STM with foam roller to adductor/groin Supine groin stretch 30s 3x  6/17  R hip ER 30 deg, R adductor strength 4-/5 with pain  STM R adductor magnus distal portion and medial HS  Exercises - Supine Hamstring Stretch  - 2 x daily - 7 x weekly - 1 sets - 3 reps - 30 hold - Supine Butterfly Groin Stretch  - 2 x daily - 7 x weekly - 1 sets - 3 reps - 30 hold - Deep Squat  - 1 x daily - 7 x weekly - 3 sets - 6 reps - Staggered Bridge  - 1 x daily - 7 x weekly - 3 sets - 3 reps - Forward T with Counter Support  - 1 x daily - 7 x weekly -  3 sets - 8 reps - Alternating Lateral Lunges  - 1 x daily - 7 x weekly - 3 sets - 10 reps  6/10  STM R adductor magnus distal portion  Groin stretch 30s 3x supine Standing adductor stretch 30s 3x Deep squat at rail 3x6 Step flexion groin stretch 10s 10x Leg swing 2x20 Wall RDL with foam roller on thighs 2x10  6/2  Exercises - Supine Hip Adduction Isometric with Ball  - 2 x daily - 7 x weekly - 2 sets - 10 reps - Supine Bridge with Mini Swiss Ball Between Knees  - 2 x daily - 7 x weekly - 2 sets - 10 reps - Supine Butterfly Groin Stretch  - 2 x daily - 7 x weekly - 1 sets - 3  reps - 30 hold   Thermotherapy, self pain management, activity tolerance and progressive exercise   PATIENT EDUCATION:  Education details:  acceptable levels of pain, anatomy, exercise progression, DOMS expectations, muscle firing,  envelope of function, HEP, POC  Person educated: Patient Education method: Explanation, Demonstration, Tactile cues, and Verbal cues Education comprehension: verbalized understanding, returned demonstration, verbal cues required, tactile cues required, and needs further education     HOME EXERCISE PROGRAM:   Access Code: [email or web address] URL: [email or web address] Date: 03/09/2024 Prepared by: Dale Call      ASSESSMENT:   CLINICAL IMPRESSION: Pt returns today with report performing HEP daily and with increased in irritation to the groin/adductor. Pt HEP review in full with new antigravity adductor exercise added with minimal discomfort. Plan for PN and potential recert at next.  Pt would benefit from continued skilled therapy in order to reach goals and maximize functional R LE strength and ROM for full return to normalized community mobility, exercise, and ADL.     OBJECTIVE IMPAIRMENTS: decreased activity tolerance, decreased endurance, decreased mobility, difficulty walking, decreased strength, and pain.    ACTIVITY LIMITATIONS: standing, transfers, and locomotion level   PARTICIPATION LIMITATIONS: cleaning, shopping, and community activity   PERSONAL FACTORS: Age, Fitness, 2+ comorbidities, and Time since onset of injury/illness/exacerbation are also affecting patient's functional outcome.    REHAB POTENTIAL: Good   CLINICAL DECISION MAKING: stable/uncomplicated   EVALUATION COMPLEXITY: Low     GOALS:     SHORT TERM GOALS: Target date: 04/20/2024      Pt will be independent and compliant with HEP for improved pain, strength, and function.  Baseline: Goal status: ongoing     2.  Pt will be able to demonstrate ability to  perform supine <> sit transfers without pain in order to demonstrate functional improvement in LE function for self-care and house hold duties.    Baseline:  Goal status: ongoing   3. Pt will report at least 2 pt reduction on NPRS scale for pain in order to demonstrate functional improvement with household activity, self care, and ADL.      Baseline:  Goal status: MET     LONG TERM GOALS: Target date: 06/01/2024      Pt will be independent and compliant with HEP for improved pain, strength, and function.  Baseline: Goal status: INITIAL   2.  Pt will be able to demonstrate at least 10 lb strength improvement in R adduction in order to demonstrate functional improvement in LE function for self-care and community mobility    Baseline:  Goal status: INITIAL   3.  Pt will be able to demonstrate DL squat/ STS to beyond parallel depth without pain in order to demonstrate  functional improvement in R LE strength for return to PLOF and exercise.    Baseline:  Goal status: INITIAL   4. Pt will report ability to getting in and out of car and bed without pain  in order to demonstrate functional improvement with household activity, self care, and ADL.     Baseline:  Goal status: INITIAL   PLAN:   PT FREQUENCY: 1-2x/week   PT DURATION: 12 wks   PLANNED INTERVENTIONS: [id number]- PT Re-evaluation, 97110-Therapeutic exercises, 97530- Therapeutic activity, [id number]- Neuromuscular re-education, 97535- Self Care, [id number]- Manual therapy, [id number]- Gait training, [phone]- Aquatic Therapy, 97014- Electrical stimulation (unattended), [id number]- Electrical stimulation (manual), [id number]- Ultrasound, Patient/Family education, Balance training, Stair training, Taping, Dry Needling, Joint mobilization, Spinal mobilization, Cryotherapy, and Moist heat   PLAN FOR NEXT SESSION: R  adductor ROM/mobility and strength         Dale Call, PT 05/25/2024, 4:28 PM

## 2024-06-10 ENCOUNTER — Encounter (HOSPITAL_BASED_OUTPATIENT_CLINIC_OR_DEPARTMENT_OTHER): Admitting: Physical Therapy

## 2024-06-13 ENCOUNTER — Other Ambulatory Visit: Payer: Self-pay | Admitting: Internal Medicine

## 2024-06-13 DIAGNOSIS — R911 Solitary pulmonary nodule: Secondary | ICD-10-CM

## 2024-06-18 ENCOUNTER — Encounter: Payer: Self-pay | Admitting: Nurse Practitioner

## 2024-06-23 ENCOUNTER — Ambulatory Visit
Admission: RE | Admit: 2024-06-23 | Discharge: 2024-06-23 | Disposition: A | Discharge status: Home / Self Care | Source: Ambulatory Visit | Attending: Nurse Practitioner

## 2024-06-23 DIAGNOSIS — Z1239 Encounter for other screening for malignant neoplasm of breast: Secondary | ICD-10-CM | POA: Diagnosis not present

## 2024-06-23 DIAGNOSIS — N6091 Unspecified benign mammary dysplasia of right breast: Secondary | ICD-10-CM

## 2024-06-23 MED ORDER — GADOPICLENOL 0.5 MMOL/ML IV SOLN
9.0000 mL | Freq: Once | INTRAVENOUS | Status: AC | PRN
  Administered 2024-06-23: 9 mL via INTRAVENOUS

## 2024-06-24 ENCOUNTER — Ambulatory Visit (HOSPITAL_BASED_OUTPATIENT_CLINIC_OR_DEPARTMENT_OTHER): Attending: Family Medicine | Admitting: Physical Therapy

## 2024-06-24 ENCOUNTER — Encounter (HOSPITAL_BASED_OUTPATIENT_CLINIC_OR_DEPARTMENT_OTHER): Payer: Self-pay | Admitting: Physical Therapy

## 2024-06-24 DIAGNOSIS — M79651 Pain in right thigh: Secondary | ICD-10-CM | POA: Insufficient documentation

## 2024-06-24 DIAGNOSIS — M6281 Muscle weakness (generalized): Secondary | ICD-10-CM | POA: Diagnosis not present

## 2024-06-24 NOTE — Therapy (Signed)
 OUTPATIENT PHYSICAL THERAPY LOWER EXTREMITY TREATMENT   PHYSICAL THERAPY DISCHARGE SUMMARY  Visits from Start of Care: 8  Plan: Patient agrees to discharge.  Patient goals were not met. Patient is being discharged due to requesting DC from therapy.       Patient Name: Desiree Snow MRN: [id number] DOB:02/21/1951, 73 y.o., female Today's Date: 06/24/2024  END OF SESSION:  PT End of Session - 06/24/24 1648     Visit Number 8    Number of Visits 12    Date for PT Re-Evaluation 06/01/24    Authorization Type [employer]    Authorization Time Period 6/2-8/25    PT Start Time 1609    PT Stop Time 1650    PT Time Calculation (min) 41 min    Activity Tolerance Patient tolerated treatment well    Behavior During Therapy [facility] for tasks assessed/performed               Past Medical History:  Diagnosis Date   Allergy     Anxiety    Arthritis    Blood transfusion without reported diagnosis    Cataract    Chicken pox    Colon polyps    Depression 1998   Postop effect after Amygdalohippocampetomy   Diverticulosis    Family history of uterine cancer 05/05/2021   GERD (gastroesophageal reflux disease)    H/O febrile seizure    as infant, x 1, List of AEDs tried: Dilantin, Mysoline, Tegretol, Gabapentine, Depakote,  Keppra, and Lamictal ,   Seizures (HCC)    resolved last seizure in 2002   Thyroid  goiter    Ulcer    small gastric ulcers found during endoscopy   Past Surgical History:  Procedure Laterality Date   BRAIN SURGERY  1998   Right Amygdalohippocampectomy   BREAST BIOPSY Right 07/29/2020   COMPLEX SCLEROSING LESION WITH ATYPICAL DUCTAL   BREAST BIOPSY Right 01/01/2020   COMPLEX SCLEROSING LESION    BREAST EXCISIONAL BIOPSY Right 09/20/2020   complex sclerosing   BREAST EXCISIONAL BIOPSY Right 03/01/2020   Favor IDC, CSL also considered   BREAST EXCISIONAL BIOPSY Left    ? Date  ?lipoma   BREAST LUMPECTOMY WITH RADIOACTIVE SEED LOCALIZATION Right  03/01/2020   Procedure: RIGHT BREAST LUMPECTOMY WITH RADIOACTIVE SEED LOCALIZATION;  Surgeon: Vanderbilt Ned, MD;  Location: [facility];  Service: General;  Laterality: Right;   BREAST LUMPECTOMY WITH RADIOACTIVE SEED LOCALIZATION Right 09/20/2020   Procedure: RIGHT BREAST LUMPECTOMY WITH RADIOACTIVE SEED LOCALIZATION;  Surgeon: Vanderbilt Ned, MD;  Location: [facility];  Service: General;  Laterality: Right;   COSMETIC SURGERY  2012   Lifestyle Lift   EYE SURGERY     right cataract removal 09/18/23   EYE SURGERY Left 01/14/2024   cataract removal   JOINT REPLACEMENT  08/2021   Left total knee replacement   KNEE SURGERY  2021   LOBECTOMY FOR SEIZURE FOCUS  1998   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   MENISECTOMY Left    SPLENECTOMY  1961   TONSILLECTOMY AND ADENOIDECTOMY  1963   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   Procedure: TOTAL KNEE ARTHROPLASTY;  Surgeon: Kay Kemps, MD;  Location: [facility];  Service: Orthopedics;  Laterality: Left;   TRIGGER FINGER RELEASE Right    middle   WISDOM TOOTH EXTRACTION     Patient Active Problem List   Diagnosis Date Noted   Right thigh pain 01/07/2024   Routine general medical examination at a health care  facility 01/07/2024   Right elbow pain 10/25/2023   Encounter for monitoring flecainide  therapy 09/10/2023   Paroxysmal atrial fibrillation (HCC) 09/10/2023   H/O total knee replacement, left 08/25/2021   Genetic testing 05/26/2021   Atypical ductal hyperplasia of right breast 12/14/2020   Chronic rhinitis/ pnds > lifelong daytime cough 02/04/2019   DOE (dyspnea on exertion) 02/04/2019   Multinodular goiter 06/20/2017   Incidental pulmonary nodule 05/15/2017   Seizure disorder (HCC) 05/15/2017   GERD (gastroesophageal reflux disease) 04/12/2017   IFG (impaired fasting glucose) 04/12/2017    PCP: Cleatrice Ludie SAUNDERS, MD   REFERRING PROVIDER: Rollene Almarie LABOR,  MD   REFERRING DIAG:  Diagnosis  M25.551 (ICD-10-CM) - Pain of right hip    THERAPY DIAG:  Pain in right thigh  Muscle weakness (generalized)  Rationale for Evaluation and Treatment: Rehabilitation  ONSET DATE: ~2019  SUBJECTIVE:   SUBJECTIVE STATEMENT: Pt reports that the groin pain is better getting in and out of the car. She feels it more around the hip. She feels more of the hip pain vs the inner thigh pain now.      Eval:   Started 2019 at [facility] with excessive stretching movement in figure 4. Pt reports it has progressively worsened. Pt does not radiate to other areas and is focused to groin region. Pt is adamant in that this is not a R hip issue or a problem with muscular origin and insertions. Pt reports that it is very painful with R LE adduction and palpation to the medial thigh/groin. Pt has history of L LE lift in shoe. Denies NT. Pt notes footwear is hard to find due to narrow foot and causing R foot to move around. Denies cancer red flags. Pt will sometimes will have pain at night and will need tylenol  to sleep. Pt [employer] 1x/week for silver sneakers. 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     PERTINENT HISTORY: Anxiety, depression, seizures, DOE, L TKA, R ankle sprain  PAIN:  Are you having pain? Yes: NPRS scale: 1/10 at rest; 5/10 at worst  Pain location: R adductor Pain description: sharp Aggravating factors: squatting, cars, transfers, hip rotations Relieving factors: nothing  PRECAUTIONS: Other: seizures  RED FLAGS: None   WEIGHT BEARING RESTRICTIONS: No  FALLS:  Has patient fallen in last 6 months? Yes. Number of falls 1 January, tripped over a mat. 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  DIAGNOSTIC FINDINGS:   IMPRESSION: 1. 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 2. Diffuse degenerative irregularity and tears of the anterior superior and superior right acetabular labrum. 3. Very mild right greater than left obturator externus origin muscle strains. No fluid bright tear. 4. Mild left common hamstring origin tendinosis. 5. Minimal left gluteus minimus insertional tendinosis. 6. Moderate pubic symphysis osteoarthritis. 7. Imaged only on a single large field-of-view coronal STIR sequence, there are two lesions within the left femoral diaphysis that are nonspecific but grossly compatible with remote infarcts. No acute fracture or cortical break is seen.     Electronically Signed   By: Tanda Lyons M.D.   On: 01/27/2024 08:57  PATIENT SURVEYS:   Lower Extremity Functional Score: 33 / 80 = 41.3 %    8/18 Lower Extremity  Functional Score: 57 / 80 = 71.3 %  9/17  Extreme difficulty/unable (0), Quite a bit of difficulty (1), Moderate difficulty (2), Little difficulty (3), No difficulty (4) Survey date:  9/17  Any of your usual work, housework or school activities 4  2. Usual hobbies, recreational or sporting activities 4  3. Getting into/out of the bath 4  4. Walking between rooms 4  5. Putting on socks/shoes 3  6. Squatting  4  7. Lifting an object, like a bag of groceries from the floor 4  8. Performing light activities around your home 4  9. Performing heavy activities around your home 1  10. Getting into/out of a car 1  11. Walking 2 blocks 2  12. Walking 1 mile 3  13. Going up/down 10 stairs (1 flight) 3  14. Standing for 1 hour 4  15.  sitting for 1 hour 4  16. Running on even ground 1  17. Running on uneven ground 1  18. Making sharp turns while running fast 0  19. Hopping  3  20. Rolling over in bed 3  Score total:  57/80       COGNITION: Overall cognitive  status: Within functional limits for tasks assessed                         PALPATION: TTP of R proximal adductor group; mild hypertonicity;   LOWER EXTREMITY MMT:   MMT (HHD in lbs)  L eval L 9/17 R eval R 9/17  Hip flexion 37.3 deferred 30.4 p! deferred  Hip extension      Hip abduction 32.9  27.9   Hip adduction 21.3  15.3 p!   Hip internal rotation      Hip external rotation      Knee flexion      Knee extension 28.6  24.6   Ankle dorsiflexion        Ankle plantarflexion        Ankle inversion        Ankle eversion         (Blank rows = not tested)   LOWER EXTREMITY ROM: moderately limited in all planes but especially painful with active ADD and passive ABD  Specials Tests: positive FABER bilaterally Positive leg roll on R    GAIT: 67ft Assistive device utilized:N/A Level of assistance: Independence Comments: Mild toe out, decreased step length bilaterally     Transfers: painful adduction with supine to sit, unable to actively adductor without pain once in butterfly position                                                                                                                                TREATMENT DATE:  .  9/17  Anatomy of condition, differential diagnosis list, review of imaging results, seeking 2nd opinion, healing timelines, histology of healing  8/18   Inf  mob grade III R  hip STM R adductor magnus distal portion and medial HS  Supine hip IR/ER 2x10 S/L adduction 2x10 Figure 4 slide 10x  HEP review  8/5   Inf  mob grade III R hip STM R adductor magnus distal portion and medial HS  Bridge with ADD 2x10 3s Standing hip flexor stretch 30s 3x (unable to do leg swing) Standing lunge alternating 2s hold 2x10 Seated figured 4 slide 2x10 5s holds   HEP updates to frequency, focusing on LLLD stretching vs intense bursts  7/7 Mulligan lateral mob grade III R hip  Shuttle leg press (frog position) 3x8 100lbs RTB clam 2x10  Side to  side lunge 2x8 (small range) Adductor machine 2x10 3s holds    6/24  Nustep warm up lvl 5 5 min  Squat at rail 3x8 Fwd T at rail 3x8 Supine HS stretch 10s 10x Self STM with foam roller to adductor/groin Supine groin stretch 30s 3x  6/17  R hip ER 30 deg, R adductor strength 4-/5 with pain  STM R adductor magnus distal portion and medial HS  Exercises - Supine Hamstring Stretch  - 2 x daily - 7 x weekly - 1 sets - 3 reps - 30 hold - Supine Butterfly Groin Stretch  - 2 x daily - 7 x weekly - 1 sets - 3 reps - 30 hold - Deep Squat  - 1 x daily - 7 x weekly - 3 sets - 6 reps - Staggered Bridge  - 1 x daily - 7 x weekly - 3 sets - 3 reps - Forward T with Counter Support  - 1 x daily - 7 x weekly - 3 sets - 8 reps - Alternating Lateral Lunges  - 1 x daily - 7 x weekly - 3 sets - 10 reps  6/10  STM R adductor magnus distal portion  Groin stretch 30s 3x supine Standing adductor stretch 30s 3x Deep squat at rail 3x6 Step flexion groin stretch 10s 10x Leg swing 2x20 Wall RDL with foam roller on thighs 2x10  6/2  Exercises - Supine Hip Adduction Isometric with Ball  - 2 x daily - 7 x weekly - 2 sets - 10 reps - Supine Bridge with Mini Swiss Ball Between Knees  - 2 x daily - 7 x weekly - 2 sets - 10 reps - Supine Butterfly Groin Stretch  - 2 x daily - 7 x weekly - 1 sets - 3 reps - 30 hold   Thermotherapy, self pain management, activity tolerance and progressive exercise   PATIENT EDUCATION:  Education details:  acceptable levels of pain, anatomy, exercise progression, DOMS expectations, muscle firing,  envelope of function, HEP, POC  Person educated: Patient Education method: Explanation, Demonstration, Tactile cues, and Verbal cues Education comprehension: verbalized understanding, returned demonstration, verbal cues required, tactile cues required, and needs further education     HOME EXERCISE PROGRAM:   Access Code: [email or web address] URL:  [email or web address] Date: 03/09/2024 Prepared by: Dale Call      ASSESSMENT:   CLINICAL IMPRESSION:  Pt requests to D/C at this time as PT believes she has reached a functional plateau with her progression. From a therapy perspective, pt's pain does not appear to be purely muscular in origin. Attempted to give differential diagnosis of potential hip joint involvement with current presentation. However, pt would like to defer at this time and consider only the thigh itself. D/C this episode of care.    OBJECTIVE IMPAIRMENTS: decreased activity tolerance, decreased endurance, decreased mobility, difficulty  walking, decreased strength, and pain.    ACTIVITY LIMITATIONS: standing, transfers, and locomotion level   PARTICIPATION LIMITATIONS: cleaning, shopping, and community activity   PERSONAL FACTORS: Age, Fitness, 2+ comorbidities, and Time since onset of injury/illness/exacerbation are also affecting patient's functional outcome.    REHAB POTENTIAL: Good   CLINICAL DECISION MAKING: stable/uncomplicated   EVALUATION COMPLEXITY: Low     GOALS:     SHORT TERM GOALS: Target date: 04/20/2024      Pt will be independent and compliant with HEP for improved pain, strength, and function.  Baseline: Goal status: MET     2.  Pt will be able to demonstrate ability to perform supine <> sit transfers without pain in order to demonstrate functional improvement in LE function for self-care and house hold duties.    Baseline:  Goal status: Not met   3. Pt will report at least 2 pt reduction on NPRS scale for pain in order to demonstrate functional improvement with household activity, self care, and ADL.      Baseline:  Goal status: MET     LONG TERM GOALS: Target date: 06/01/2024      Pt will be independent and compliant with HEP for improved pain, strength, and function.  Baseline: Goal status: not met    2.  Pt will be able to demonstrate at least 10 lb  strength improvement in R adduction in order to demonstrate functional improvement in LE function for self-care and community mobility    Baseline:  Goal status: not met   3.  Pt will be able to demonstrate DL squat/ STS to beyond parallel depth without pain in order to demonstrate functional improvement in R LE strength for return to PLOF and exercise.    Baseline:  Goal status: not met   4. Pt will report ability to getting in and out of car and bed without pain  in order to demonstrate functional improvement with household activity, self care, and ADL.     Baseline:  Goal status: not met   PLAN:   PT FREQUENCY: 1-2x/week   PT DURATION: 12 wks   PLANNED INTERVENTIONS: 97164- PT Re-evaluation, 97110-Therapeutic exercises, 97530- Therapeutic activity, 97112- Neuromuscular re-education, 97535- Self Care, [id number]- Manual therapy, [phone]- Gait training, [phone]- Aquatic Therapy, 97014- Electrical stimulation (unattended), [id number]- Electrical stimulation (manual), [id number]- Ultrasound, Patient/Family education, Balance training, Stair training, Taping, Dry Needling, Joint mobilization, Spinal mobilization, Cryotherapy, and Moist heat        Dale Call, PT 06/24/2024, 5:01 PM

## 2024-07-07 ENCOUNTER — Other Ambulatory Visit: Payer: Self-pay | Admitting: Cardiology

## 2024-07-14 ENCOUNTER — Ambulatory Visit: Admitting: Internal Medicine

## 2024-07-27 ENCOUNTER — Encounter: Payer: Self-pay | Admitting: Internal Medicine

## 2024-07-27 ENCOUNTER — Ambulatory Visit (INDEPENDENT_AMBULATORY_CARE_PROVIDER_SITE_OTHER): Admitting: Internal Medicine

## 2024-07-27 ENCOUNTER — Encounter: Payer: Self-pay | Admitting: Nurse Practitioner

## 2024-07-27 VITALS — BP 134/60 | HR 88 | Temp 98.0°F | Ht 66.0 in | Wt 191.6 lb

## 2024-07-27 DIAGNOSIS — R351 Nocturia: Secondary | ICD-10-CM | POA: Diagnosis not present

## 2024-07-27 DIAGNOSIS — Z23 Encounter for immunization: Secondary | ICD-10-CM | POA: Diagnosis not present

## 2024-07-27 DIAGNOSIS — M79651 Pain in right thigh: Secondary | ICD-10-CM

## 2024-07-27 DIAGNOSIS — R7301 Impaired fasting glucose: Secondary | ICD-10-CM

## 2024-07-27 DIAGNOSIS — F419 Anxiety disorder, unspecified: Secondary | ICD-10-CM

## 2024-07-27 LAB — HEMOGLOBIN A1C: Hgb A1c MFr Bld: 6.4 % (ref 4.6–6.5)

## 2024-07-27 MED ORDER — CLONAZEPAM 0.5 MG PO TABS: 0.5000 mg | ORAL_TABLET | Freq: Two times a day (BID) | ORAL | 1 refills | Status: DC | PRN

## 2024-07-27 NOTE — Progress Notes (Unsigned)
 Subjective:   Patient ID: Desiree Snow, female    DOB: December 04, 1950, 73 y.o.   MRN: [id number]  Discussed the use of AI scribe software for clinical note transcription with the patient, who gave verbal consent to proceed.  History of Present Illness Desiree Snow is a 73 year old female with essential tremor and right hip arthritis who presents with concerns about hand tremors and hip pain.  She experiences difficulty with handwriting, particularly when writing quickly, which she attributes to her essential tremor. She has to force herself to form certain letters, such as 'M', and notes that her handwriting becomes messy. No numbness, tingling, or pain in her hand. Her father has also mentioned her tremor.  She experiences pain between her skin and muscle, noticeable when pressing down. A previous MRI showed irritation in the obturator muscle but no specific tear. She uses a lift in her left shoe to address a perceived leg length discrepancy, which she feels has helped with her hip pain.  She experiences frequent nocturia, sometimes up to four times a night, but not consistently. She does not believe it is related to her fluid intake before bed. She consumes caffeine throughout the day, including in the evening.  She has been feeling more anxious recently, particularly after joining a singing group, leading her to use chlorazepate occasionally. She is concerned about potential memory impairment with regular benzodiazepine use. She has started a new therapy called [facility] ([facility]) to address her anxiety.  She continues to take her medications, including her seizure medication and cinnamon , with adjustments to the timing to avoid interactions and side effects such as brain fog.   Review of Systems  Constitutional: Negative.   HENT: Negative.    Eyes: Negative.   Respiratory:  Negative for cough, chest tightness and shortness of breath.   Cardiovascular:   Negative for chest pain, palpitations and leg swelling.  Gastrointestinal:  Negative for abdominal distention, abdominal pain, constipation, diarrhea, nausea and vomiting.  Musculoskeletal:  Positive for arthralgias and myalgias.  Skin: Negative.   Neurological: Negative.   Psychiatric/Behavioral: Negative.      Objective:  Physical Exam Constitutional:      Appearance: She is well-developed.  HENT:     Head: Normocephalic and atraumatic.  Cardiovascular:     Rate and Rhythm: Normal rate and regular rhythm.  Pulmonary:     Effort: Pulmonary effort is normal. No respiratory distress.     Breath sounds: Normal breath sounds. No wheezing or rales.  Abdominal:     General: Bowel sounds are normal. There is no distension.     Palpations: Abdomen is soft.     Tenderness: There is no abdominal tenderness.  Musculoskeletal:        General: Tenderness present.     Cervical back: Normal range of motion.  Skin:    General: Skin is warm and dry.  Neurological:     Mental Status: She is alert and oriented to person, place, and time.     Coordination: Coordination normal.     Vitals:   07/27/24 1420  BP: 134/60  Pulse: 88  Temp: 98 F (36.7 C)  TempSrc: Oral  SpO2: 95%  Weight: 191 lb 9.6 oz (86.9 kg)  Height: 5' 6 (1.676 m)   Flu shot given at visit  Assessment and Plan Assessment & Plan Right hip osteoarthritis/right thigh pain She has moderate to severe osteoarthritis with pain, possibly due to leg length discrepancy. A shoe  lift provides some relief. Continue using the shoe lift in the left shoe.MRI shows irritation with edema, but no tear. Pain is improving but still present. Continue physical therapy  Nocturia   She experiences intermittent nocturia up to four times a night, with no clear correlation to intake. Caffeine and bladder function were discussed. Reduce caffeine intake, especially in the afternoon and evening. Limit food and beverage intake after 7  PM.  Anxiety disorder   She has increased anxiety levels and occasionally uses clorazepate , with concerns about memory impairment from benzodiazepines. She is undergoing [facility] therapy. Prescribe clonazepam for acute anxiety episodes as needed. Continue IFS therapy.  Pre-diabetes There is concern about blood sugar levels rising above 6.4%, with recent changes in cinnamon  intake. Check blood work to monitor blood sugar levels.

## 2024-07-28 ENCOUNTER — Ambulatory Visit: Payer: Self-pay | Admitting: Internal Medicine

## 2024-07-29 ENCOUNTER — Other Ambulatory Visit: Payer: Self-pay | Admitting: Nurse Practitioner

## 2024-07-29 DIAGNOSIS — R351 Nocturia: Secondary | ICD-10-CM | POA: Insufficient documentation

## 2024-07-29 DIAGNOSIS — N6091 Unspecified benign mammary dysplasia of right breast: Secondary | ICD-10-CM

## 2024-07-29 DIAGNOSIS — F419 Anxiety disorder, unspecified: Secondary | ICD-10-CM | POA: Insufficient documentation

## 2024-07-29 DIAGNOSIS — Z1231 Encounter for screening mammogram for malignant neoplasm of breast: Secondary | ICD-10-CM

## 2024-07-29 NOTE — Assessment & Plan Note (Signed)
 She experiences intermittent nocturia up to four times a night, with no clear correlation to intake. Caffeine and bladder function were discussed. Reduce caffeine intake, especially in the afternoon and evening. Limit food and beverage intake after 7 PM.

## 2024-07-29 NOTE — Assessment & Plan Note (Signed)
 There is concern about blood sugar levels rising above 6.4%, with recent changes in cinnamon  intake. Check blood work to monitor blood sugar levels.

## 2024-07-29 NOTE — Assessment & Plan Note (Signed)
 MRI shows irritation with edema, but no tear. Pain is improving but still present. Continue physical therapy.She has moderate to severe osteoarthritis with pain, possibly due to leg length discrepancy. A shoe lift provides some relief. Continue using the shoe lift in the left shoe.

## 2024-07-29 NOTE — Assessment & Plan Note (Signed)
 She has increased anxiety levels and occasionally uses clorazepate , with concerns about memory impairment from benzodiazepines. She is undergoing [facility] therapy. Prescribe clonazepam for acute anxiety episodes as needed. Continue IFS therapy.

## 2024-08-04 ENCOUNTER — Other Ambulatory Visit: Payer: Self-pay | Admitting: *Deleted

## 2024-08-04 DIAGNOSIS — I480 Paroxysmal atrial fibrillation: Secondary | ICD-10-CM

## 2024-08-04 MED ORDER — APIXABAN 5 MG PO TABS: 5.0000 mg | ORAL_TABLET | Freq: Two times a day (BID) | ORAL | 2 refills | Status: AC

## 2024-08-04 NOTE — Telephone Encounter (Signed)
 Eliquis  5mg  refill request received. Patient is 73 years old, weight-86.9kg, Crea-0.96 on 04/21/24, Diagnosis-Afib, and last seen by Josefa Beauvais on 03/03/24. Dose is appropriate based on dosing criteria. Will send in refill to requested pharmacy.

## 2024-08-17 LAB — OPHTHALMOLOGY REPORT-SCANNED

## 2024-09-07 ENCOUNTER — Encounter: Payer: Self-pay | Admitting: Internal Medicine

## 2024-09-07 ENCOUNTER — Ambulatory Visit: Admitting: Internal Medicine

## 2024-09-07 VITALS — BP 120/70 | HR 90 | Ht 66.0 in | Wt 190.6 lb

## 2024-09-07 DIAGNOSIS — E042 Nontoxic multinodular goiter: Secondary | ICD-10-CM | POA: Diagnosis not present

## 2024-09-07 NOTE — Patient Instructions (Addendum)
 Please stop at the lab.  We will schedule a new thyroid  [facility]  Please return in a year.

## 2024-09-07 NOTE — Progress Notes (Signed)
 Patient ID: Desiree Snow, female   DOB: 1950-12-05, 73 y.o.   MRN: [id number]  HPI  Desiree Snow is a 73 y.o.-year-old female, returning for follow-up for multinodular goiter.  She previously saw Dr. Kassie, but last visit with me 6 months ago.  Interim history: Before last visit, she had a new dx of Afib and was started on Eliquis  and taken off at least.  She also had a TIA in the 2 weeks prior to our last appointment.  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 She is monitored q6 mo. She also had oral surgery in 06/2024 >> was not able to eat very well >> lost 6 lbs since visit.  She is worried about this and would like her thyroid  tests checked.  Reviewed thyroid  history: Patient was diagnosed with thyroid  nodules in 04/2017 -these were incidentally seen on a CT scan of her neck.  Thyroid  U/S (06/13/2017): She had 3 thyroid  nodules in the left lobe, of which the inferior and nodules met the criteria for biopsy, while the sup nodule qualified for 1 year follow-up  Bx (06/25/2017) of the L inf nodule (2.1 cm): benign  Thyroid  U/S (07/02/2018): Stable nodules  Thyroid  U/S (07/27/2019): Stable nodules  Thyroid  U/S (07/20/2020): Stable nodules, with the left mid thyroid  nodule not meeting criteria for follow-up or biopsy anymore  Thyroid  U/S (07/18/2021): Stable nodules  Thyroid  U/S (08/06/2022): Stable nodules except for the left inferior thyroid  nodule, which increased in size from 2.3 to 2.8 cm and now meets criteria for biopsy: Isthmus: 0.8 cm  Right lobe: 5.1 cm x 1.6 cm x 1.8 cm  Left lobe: 5.2 cm x 1.9 cm x 2.1 cm  ________________________________________________________   Estimated total number of nodules >/= 1 cm: 5 _________________________________________________________   Nodule labeled 1 in the isthmus, 1.1 cm, unchanged. Nodule remains TR 4 and has been stable now 5 years. Discontinuing surveillance may be  reasonable.   Nodule labeled 2, superior right thyroid , 1.2 cm. Nodule remains TR 3, smaller than prior, and does not meet criteria for surveillance.   Nodule labeled 3, mid right thyroid , 8 mm with spongiform characteristics and does not meet criteria for surveillance.   Nodule labeled 4, inferior right thyroid , 0.9 cm. Nodule remains unchanged and does not meet criteria for surveillance.   Nodule labeled 5, left superior thyroid , 1.8 cm, unchanged. Nodule remains TR 3 and appears stable for 5 years. Discontinuing surveillance may be reasonable.   Nodule labeled 6, mid left thyroid , 1.0 cm this nodule again demonstrates ill-defined borders, potentially a pseudo nodule.   Nodule labeled 7, inferior left thyroid , previously 2.3 cm, now 2.8 cm. Nodule remains TR 3 and now meets criteria for biopsy.    No adenopathy   IMPRESSION: Multinodular thyroid  goiter again demonstrated, as above.   The only significant change would be the larger size of nodule labeled 7 inferior left thyroid  (2.8 cm, TR 3) which now meets criteria for biopsy as designated by the newly established ACR TI-RADS criteria, and referral for biopsy is recommended.   The other previously documented nodules (labeled 1 and 5) have now demonstrated 5 years of stability and discontinuing surveillance may be reasonable.  Thyroid  FNA (10/11/2022) (L 2.8 cm nodule): Benign  I reviewed pt's thyroid  tests: Lab Results  Component Value Date   TSH 0.89 01/07/2024   TSH 1.240 07/08/2023   TSH 0.87 09/25/2022   TSH 0.69 10/27/2021   TSH  1.50 07/12/2021   TSH 1.46 07/12/2020   TSH 0.87 07/07/2019   TSH 1.27 12/25/2017   TSH 1.22 11/22/2016   TSH 1.29 11/24/2015   FREET4 1.13 07/08/2023   FREET4 0.81 09/25/2022   FREET4 0.76 10/27/2021   FREET4 0.84 07/12/2021   FREET4 0.74 07/12/2020    No FH of thyroid  ds. No FH of thyroid  cancer. No h/o radiation tx to head or neck. No recent contrast studies. No steroid use.   + A small amount of biotin supplement - in B complex.    Pt also has a history of GERD, distant history of seizure disorder, obesity class I. She also tells me that her cat has hypothyroidism and she is putting methimazole gel on her years.   I advised her to use gloves.  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   Diverticulosis    Family history of uterine cancer 05/05/2021   GERD (gastroesophageal reflux disease)    H/O febrile seizure    as infant, x 1, List of AEDs tried: Dilantin, Mysoline, Tegretol, Gabapentine, Depakote,  Keppra, and Lamictal ,   Seizures (HCC)    resolved last seizure in 2002   Thyroid  goiter    Ulcer    small gastric ulcers found during endoscopy   Past Surgical History:  Procedure Laterality Date   BRAIN SURGERY  1998   Right Amygdalohippocampectomy   BREAST BIOPSY Right 07/29/2020   COMPLEX SCLEROSING LESION WITH ATYPICAL DUCTAL   BREAST BIOPSY Right 01/01/2020   COMPLEX SCLEROSING LESION    BREAST EXCISIONAL BIOPSY Right 09/20/2020   complex sclerosing   BREAST EXCISIONAL BIOPSY Right 03/01/2020   Favor IDC, CSL also considered   BREAST EXCISIONAL BIOPSY Left    ? Date  ?lipoma   BREAST LUMPECTOMY WITH RADIOACTIVE SEED LOCALIZATION Right 03/01/2020   Procedure: RIGHT BREAST LUMPECTOMY WITH RADIOACTIVE SEED LOCALIZATION;  Surgeon: Vanderbilt Ned, MD;  Location: [facility];  Service: General;  Laterality: Right;   BREAST LUMPECTOMY WITH RADIOACTIVE SEED LOCALIZATION Right 09/20/2020   Procedure: RIGHT BREAST LUMPECTOMY WITH RADIOACTIVE SEED LOCALIZATION;  Surgeon: Vanderbilt Ned, MD;  Location: [facility];  Service: General;  Laterality: Right;   COSMETIC SURGERY  2012   Lifestyle Lift   EYE SURGERY      right cataract removal 09/18/23   EYE SURGERY Left 01/14/2024   cataract removal   JOINT REPLACEMENT  08/2021   Left total knee replacement   KNEE SURGERY  2021   LOBECTOMY FOR SEIZURE FOCUS  1998   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   MENISECTOMY Left    SPLENECTOMY  1961   TONSILLECTOMY AND ADENOIDECTOMY  1963   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   Procedure: TOTAL KNEE ARTHROPLASTY;  Surgeon: Kay Kemps, MD;  Location: [facility];  Service: Orthopedics;  Laterality: Left;   TRIGGER FINGER RELEASE Right    middle   WISDOM TOOTH EXTRACTION     Social History   Socioeconomic History   Marital status: Single    Spouse name: Not on file   Number of children: 0   Years of education: Not on file   Highest education level: Master's degree (e.g., MA, MS, MEng, MEd,  MSW, MBA)  Occupational History   Occupation: NP     Comment: NA  Tobacco Use   Smoking status: Former    Current packs/day: 0.00    Average packs/day: 2.0 packs/day for 29.0 years (58.0 ttl pk-yrs)    Types: Cigarettes    Start date: 10/08/1973    Quit date: 12/10/2002    Years since quitting: 21.7    Passive exposure: Past   Smokeless tobacco: Never   Tobacco comments:    Decided I wanted to continue to breathe  Vaping Use   Vaping status: Never Used  Substance and Sexual Activity   Alcohol use: No   Drug use: No   Sexual activity: Not Currently    Birth control/protection: Post-menopausal  Other Topics Concern   Not on file  Social History Narrative   Lives alone   Caffeine- 2 c coffee   Social Drivers of Corporate Investment Banker Strain: Low Risk  (07/23/2024)   Overall Financial Resource Strain (CARDIA)    Difficulty of Paying Living Expenses: Not hard at all  Food Insecurity: No Food Insecurity (07/23/2024)   Hunger Vital Sign    Worried About Running Out of Food in the Last Year: Never true    Ran Out of Food in the Last Year: Never true  Transportation Needs: No  Transportation Needs (07/23/2024)   PRAPARE - Administrator, Civil Service (Medical): No    Lack of Transportation (Non-Medical): No  Physical Activity: Insufficiently Active (07/23/2024)   Exercise Vital Sign    Days of Exercise per Week: 2 days    Minutes of Exercise per Session: 20 min  Stress: No Stress Concern Present (07/23/2024)   [employer] of Occupational Health - Occupational Stress Questionnaire    Feeling of Stress: Only a little  Social Connections: Unknown (07/23/2024)   Social Connection and Isolation Panel    Frequency of Communication with Friends and Family: Never    Frequency of Social Gatherings with Friends and Family: Not on file    Attends Religious Services: Never    Active Member of Clubs or Organizations: Yes    Attends Engineer, Structural: More than 4 times per year    Marital Status: Never married  Intimate Partner Violence: Not At Risk (12/24/2023)   Humiliation, Afraid, Rape, and Kick questionnaire    Fear of Current or Ex-Partner: No    Emotionally Abused: No    Physically Abused: No    Sexually Abused: No   Current Outpatient Medications on File Prior to Visit  Medication Sig Dispense Refill   apixaban  (ELIQUIS ) 5 MG TABS tablet Take 1 tablet (5 mg total) by mouth 2 (two) times daily. 180 tablet 2   B Complex Vitamins (B COMPLEX 1 PO)      Calcium  Carbonate-Vit D-Min (CALCIUM  1200 PO) Take 1 tablet by mouth daily.     Cholecalciferol  (VITAMIN D3) 1000 units CAPS Take 1,000 Units by mouth daily.     CINNAMON  PO Take 100 mg by mouth at bedtime.     clonazePAM  (KLONOPIN ) 0.5 MG tablet Take 1 tablet (0.5 mg total) by mouth 2 (two) times daily as needed for anxiety. 20 tablet 1   Coenzyme Q10 (CO Q-10) 100 MG CAPS Take 100 mg by mouth at bedtime.     Collagen-Vitamin C-Biotin (COLLAGEN PO) Biocell collagen-take 1 tablet by mouth daily Does not have Vitamin C in it     diltiazem  (CARDIZEM  CD) 120 MG 24 hr capsule  TAKE 1  CAPSULE BY MOUTH EVERY DAY 90 capsule 2   famotidine  (PEPCID ) 20 MG tablet One after supper 30 tablet 11   flecainide  (TAMBOCOR ) 100 MG tablet TAKE 1 TABLET BY MOUTH TWICE A DAY 180 tablet 0   lamoTRIgine  (LAMICTAL ) 100 MG tablet Take 1 tablet (100 mg total) by mouth 2 (two) times daily. 180 tablet 4   Magnesium  250 MG TABS Take 250 mg by mouth daily.     Multiple Vitamin (MULTIVITAMIN) tablet Take 1 tablet by mouth daily.     pantoprazole  (PROTONIX ) 40 MG tablet TAKE 1 TABLET (40 MG TOTAL) BY MOUTH DAILY. TAKE 30-60 MIN BEFORE FIRST MEAL OF THE DAY (Patient not taking: Reported on 07/27/2024) 90 tablet 0   TURMERIC PO Take 1 tablet by mouth daily.     No current facility-administered medications on file prior to visit.   Allergies  Allergen Reactions   Cetirizine  Other (See Comments)    Cognitive issues and lowers seizure threshold.   Kenalog [Triamcinolone ]     Felt very strange   Metronidazole  Other (See Comments)    Strange feeling and inability to function   Quinolones    Scopolamine Hives     on her fingers no itchng   Penicillins Rash   Family History  Problem Relation Age of Onset   Pneumonia Mother    Alcohol abuse Mother    Early death Mother    Dementia Father    Uterine cancer Maternal Aunt        dx before 6   Vision loss Maternal Aunt    Diabetes Maternal Uncle        x 2   Hypertension Maternal Grandmother    Stroke Maternal Grandmother    Clotting disorder Paternal Grandmother    Dementia Paternal Grandfather    Lung cancer Cousin        paternal female cousin; dx after 69   Cancer Maternal Aunt    Goiter Neg Hx    Thyroid  cancer Neg Hx    Thyroid  nodules Neg Hx    Thyroid  disease Neg Hx    Colon cancer Neg Hx    Esophageal cancer Neg Hx    Rectal cancer Neg Hx    Stomach cancer Neg Hx    PE: BP 120/70   Pulse 90   Ht 5' 6 (1.676 m)   Wt 190 lb 9.6 oz (86.5 kg)   SpO2 95%   BMI 30.76 kg/m  Wt Readings from Last 15 Encounters:  09/07/24  190 lb 9.6 oz (86.5 kg)  07/27/24 191 lb 9.6 oz (86.9 kg)  04/23/24 198 lb 11.2 oz (90.1 kg)  03/30/24 196 lb 9.6 oz (89.2 kg)  03/17/24 198 lb (89.8 kg)  03/03/24 196 lb (88.9 kg)  02/17/24 197 lb (89.4 kg)  01/27/24 197 lb (89.4 kg)  01/13/24 197 lb (89.4 kg)  01/07/24 201 lb (91.2 kg)  12/24/23 199 lb 3.2 oz (90.4 kg)  10/24/23 200 lb (90.7 kg)  09/10/23 198 lb 6.4 oz (90 kg)  08/06/23 204 lb (92.5 kg)  07/30/23 202 lb 6.4 oz (91.8 kg)   Constitutional: overweight, in NAD Eyes:  EOMI, no exophthalmos ENT: no neck masses, fullness in the right lobe of her thyroid  small thyroid  nodule palpated low in the thyroid  fossa, no cervical lymphadenopathy Cardiovascular: RRR, No MRG Respiratory: CTA B Musculoskeletal: no deformities Skin:no rashes Neurological: no tremor with outstretched hands  ASSESSMENT: 1. 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 Thyroid  nodules -Patient with a history of multiple thyroid  nodules that have been followed with annual ultrasounds since 2019 after 2023.  On the latest thyroid  ultrasound from 07/2022, the nodules appeared to be stable except for a left inferior thyroid  nodule, which had increased in size from 2.3 to 2.8 cm.  This nodule was large and hypoechoic, but otherwise did not have other risk factors for cancer including microcalcifications, internal blood flow, taller than wide distribution and irregular margins.  Also, she does not have a family history of thyroid  cancer or personal history of radiation therapy to head or neck to increase her own risk of thyroid  cancer.  Moreover, the nodule was biopsied in 2018 with benign results.  She had a second biopsy of this nodule in 10/2022 with benign results.  We discussed that after 2 benign biopsies, the risk of cancer is extremely low. - She described her voice giving out when singing and also higher pitched tonality.  We discussed about a barium swallowing study to see the extent of the  thyroid  compression on the esophagus.  However, we also discussed about possibility of thyroidectomy and the fact that this could affect her singing.  Also, it has an approximate 25% risk of hypothyroidism.  Therefore, we decided to avoid surgery and did not proceed with a barium swallow at that time.  I did recommend evaluation by ENT for postnasal drip and also to check her vocal cords.  However, she ended up not seeing them as her vocal symptoms actually resolved. At last visit, we discussed about the possibility of radiofrequency ablation, which was a new method of treatment that became available in the area, to be employed if her nodules become bothersome.  However, at that time she did not have neck compression symptoms only slightly more fullness on the right side of her thyroid  on palpation. - At today's visit, she still has fullness on thyroid  palpation and no neck compression symptoms.  We we will go ahead with a thyroid  ultrasound now.  Will, she also mentions that her voice is giving up out when she sings especially hide notes.  She also feels a nodule in her neck every time she swallows.  Depending on the ultrasound results, we discussed about possible referral for RFA. - I will see her back in a year  2.  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  I strongly advised her to use gloves.  As of now, she does not touch the cat anymore, if she got a different dispenser for the gel. - Latest TFTs were normal in 01/2024 - No signs or symptoms of hypo or hyperthyroidism except for weight loss, which could be related to her dental surgery. - Will continue to keep an eye on her TFTs-will check them again today especially as she complains of weight loss and is worried about her TFTs.  Orders Placed This Encounter  Procedures   US  THYROID    TSH   T4, free   T3, free   Component     Latest Ref Rng  09/07/2024  TSH     0.40 - 4.50 mIU/L 1.43   T4,Free(Direct)     0.8 - 1.8 ng/dL 1.1   Triiodothyronine,Free,Serum     2.3 - 4.2 pg/mL 3.2   TFTs are normal. Thyroid  ultrasound is scheduled for 09/16/2023.  Thyroid  U/S (09/15/2024): Right thyroid  lobe: 4.9 x 2.1  x 1.8 cm (previously 5.1 x 1.6 x 1.8 cm) Left thyroid  lobe: 5.5 x 2.4 x 2.2 cm (previously 5.2 x 1.9 x 2.1 cm) Isthmus: 0.4 cm thickness (previously 0.8 cm)   Echotexture: Parenchymal echotexture moderately heterogeneous.   Vascularity: Normal Vascularity.   Thyroid  Nodules: Nodule 1 Location - Mid isthmus Size - 0.9 cm (previously 1.1 cm) Composition - Solid Echogenicity - Hypoechoic Shape - Wider than tall Margins - Smooth Echogenic Foci - None TI-Rads Category - 4 Change from prior - Stable since 07/27/2019. Recommendation - No follow-up required.   Nodule 2 Location - Superior right Size - 1.1 x 1 x 0.7 cm (previously 1.2 x 1.1 x 0.8 cm) Composition - Solid Echogenicity - Hypoechoic Shape - Wider than tall Margins - Indistinct Echogenic Foci - No calcifications TI-Rads Category - 4   Nodule 3 Location - Mid right Size - 1 x 1 x 0.5 cm (previously 0.8 x 0.8 x 0.6 cm) Composition - Solid Echogenicity - Hypoechoic Shape - Wider than tall Margins - Indistinct Echogenic Foci - No calcifications TI-Rads Category - 4 Additional finding - Mild systolic dysfunction. (?)   Nodule 4 Location - Inferior right Size - 0.9 x 0.9 x 0.7 cm (previously 1 x 0.9 x 0.7 cm) Composition - Solid Echogenicity - Not specified Shape - Wider than tall Margins - Indistinct Echogenic Foci - Coarse calcifications TI-Rads Category - 3   Nodule 5 Location - Superior left Size - 1.9 x 1.5 x 1.4 cm (previously 1.8 x 1.3 x 1.3 cm) Composition - Solid Echogenicity - Hypoechoic Shape - Taller than wide Margins - Indistinct Echogenic Foci - No calcifications TI-Rads Category - 5   Nodule 6 Location - Mid left Size - 1 x  0.8 x 0.7 cm Composition - Mixed solid/cystic Echogenicity - Not specified Shape - Wider than tall Margins - Indistinct Echogenic Foci - No calcifications TI-Rads Category - 2 Change from prior - Stable.   Nodule 7 Location - Inferior left Size - 2.4 x 2 x 1.5 cm (previously 2.8 x 2.2 x 1.4 cm) Composition - Solid Echogenicity - Not specified Shape - Wider than tall Margins - Smooth Echogenic Foci - None TI-Rads Category - 4 Prior Biopsy Result - Previously biopsied x 2. Recommendation - No follow-up required.   Soft tissues: No regional cervical adenopathy identified.   IMPRESSION: 1. Left superior nodule measuring 1.9 x 1.5 x 1.4 cm, TR5, fine-needle aspiration recommended. 2. Recommend annual/biennial follow-up of superior right and mid right nodules until stability times 5 years confirmed. 3. Additional nodules do not meet criteria for biopsy or follow-up.  Will suggest biopsy of the left superior 1.9 cm nodule.  Lela Fendt, MD PhD [facility]

## 2024-09-08 ENCOUNTER — Ambulatory Visit: Payer: Self-pay | Admitting: Internal Medicine

## 2024-09-08 LAB — T4, FREE: Free T4: 1.1 ng/dL (ref 0.8–1.8)

## 2024-09-08 LAB — T3, FREE: T3, Free: 3.2 pg/mL (ref 2.3–4.2)

## 2024-09-08 LAB — TSH: TSH: 1.43 m[IU]/L (ref 0.40–4.50)

## 2024-09-10 ENCOUNTER — Ambulatory Visit: Admitting: Internal Medicine

## 2024-09-15 ENCOUNTER — Inpatient Hospital Stay: Admission: RE | Admit: 2024-09-15 | Discharge: 2024-09-15 | Attending: Internal Medicine | Admitting: Internal Medicine

## 2024-09-15 DIAGNOSIS — E042 Nontoxic multinodular goiter: Secondary | ICD-10-CM

## 2024-09-21 ENCOUNTER — Other Ambulatory Visit: Payer: Self-pay | Admitting: Internal Medicine

## 2024-09-21 DIAGNOSIS — E042 Nontoxic multinodular goiter: Secondary | ICD-10-CM

## 2024-10-14 ENCOUNTER — Other Ambulatory Visit: Payer: Self-pay | Admitting: Internal Medicine

## 2024-10-16 ENCOUNTER — Other Ambulatory Visit (HOSPITAL_COMMUNITY)
Admission: RE | Admit: 2024-10-16 | Discharge: 2024-10-16 | Disposition: A | Discharge status: Home / Self Care | Source: Ambulatory Visit | Attending: Diagnostic Radiology | Admitting: Diagnostic Radiology

## 2024-10-16 ENCOUNTER — Ambulatory Visit
Admission: RE | Admit: 2024-10-16 | Discharge: 2024-10-16 | Disposition: A | Source: Ambulatory Visit | Attending: Internal Medicine | Admitting: Internal Medicine

## 2024-10-16 DIAGNOSIS — E042 Nontoxic multinodular goiter: Secondary | ICD-10-CM

## 2024-10-20 ENCOUNTER — Ambulatory Visit: Payer: Self-pay | Admitting: Internal Medicine

## 2024-10-20 LAB — CYTOLOGY - NON PAP

## 2024-10-21 ENCOUNTER — Other Ambulatory Visit

## 2024-10-29 ENCOUNTER — Telehealth: Payer: Self-pay | Admitting: Cardiology

## 2024-10-29 NOTE — Telephone Encounter (Signed)
" °*  STAT* If patient is at the pharmacy, call can be transferred to refill team.   1. Which medications need to be refilled? (please list name of each medication and dose if known) flecainide  (TAMBOCOR ) 100 MG tablet   2. Which pharmacy/location (including street and city if local pharmacy) is medication to be sent to? [id number] - [place], [place] - [address]  3. Do they need a 30 day or 90 day supply? 90 day  Patient is out of this medication!   "

## 2024-11-02 ENCOUNTER — Encounter: Payer: Self-pay | Admitting: Cardiology

## 2024-11-03 ENCOUNTER — Other Ambulatory Visit: Payer: Self-pay | Admitting: Cardiology

## 2024-11-03 MED ORDER — FLECAINIDE ACETATE 100 MG PO TABS: 100.0000 mg | ORAL_TABLET | Freq: Two times a day (BID) | ORAL | 0 refills | Status: DC

## 2024-11-03 MED ORDER — FLECAINIDE ACETATE 100 MG PO TABS: 100.0000 mg | ORAL_TABLET | Freq: Two times a day (BID) | ORAL | 0 refills | Status: AC

## 2024-11-03 NOTE — Telephone Encounter (Signed)
 Pt scheduled to see Jodie Passey, NP, 11/30/24.  Will send in a 30 day refill.

## 2024-11-30 ENCOUNTER — Ambulatory Visit: Admitting: Student

## 2024-12-14 ENCOUNTER — Ambulatory Visit: Admitting: Cardiology

## 2025-01-07 ENCOUNTER — Ambulatory Visit

## 2025-01-12 ENCOUNTER — Encounter: Admitting: Internal Medicine

## 2025-01-12 ENCOUNTER — Ambulatory Visit

## 2025-01-25 ENCOUNTER — Ambulatory Visit: Admitting: Internal Medicine

## 2025-02-23 ENCOUNTER — Ambulatory Visit: Admitting: Diagnostic Neuroimaging

## 2025-03-30 ENCOUNTER — Ambulatory Visit: Admitting: Internal Medicine

## 2025-04-23 ENCOUNTER — Other Ambulatory Visit

## 2025-04-30 ENCOUNTER — Ambulatory Visit: Admitting: Nurse Practitioner

## 2025-09-07 ENCOUNTER — Ambulatory Visit: Admitting: Internal Medicine
